# Patient Record
Sex: Female | Born: 1945 | Race: White | Hispanic: No | Smoking: Former smoker
Health system: Southern US, Community
[De-identification: ages and names within clinical notes are randomized; demographics above are authoritative.]

## PROBLEM LIST (undated history)

## (undated) DIAGNOSIS — N921 Excessive and frequent menstruation with irregular cycle: Secondary | ICD-10-CM

## (undated) DIAGNOSIS — I340 Nonrheumatic mitral (valve) insufficiency: Secondary | ICD-10-CM

## (undated) DIAGNOSIS — M1990 Unspecified osteoarthritis, unspecified site: Secondary | ICD-10-CM

## (undated) DIAGNOSIS — R569 Unspecified convulsions: Secondary | ICD-10-CM

## (undated) DIAGNOSIS — N393 Stress incontinence (female) (male): Secondary | ICD-10-CM

## (undated) DIAGNOSIS — I4891 Unspecified atrial fibrillation: Secondary | ICD-10-CM

## (undated) DIAGNOSIS — D219 Benign neoplasm of connective and other soft tissue, unspecified: Secondary | ICD-10-CM

## (undated) DIAGNOSIS — I350 Nonrheumatic aortic (valve) stenosis: Secondary | ICD-10-CM

## (undated) DIAGNOSIS — I429 Cardiomyopathy, unspecified: Secondary | ICD-10-CM

## (undated) DIAGNOSIS — R3129 Other microscopic hematuria: Secondary | ICD-10-CM

## (undated) DIAGNOSIS — N200 Calculus of kidney: Secondary | ICD-10-CM

## (undated) DIAGNOSIS — I480 Paroxysmal atrial fibrillation: Secondary | ICD-10-CM

## (undated) DIAGNOSIS — G40909 Epilepsy, unspecified, not intractable, without status epilepticus: Secondary | ICD-10-CM

## (undated) DIAGNOSIS — I517 Cardiomegaly: Secondary | ICD-10-CM

## (undated) DIAGNOSIS — E7800 Pure hypercholesterolemia, unspecified: Secondary | ICD-10-CM

## (undated) DIAGNOSIS — I10 Essential (primary) hypertension: Secondary | ICD-10-CM

## (undated) DIAGNOSIS — R0789 Other chest pain: Secondary | ICD-10-CM

## (undated) DIAGNOSIS — N907 Vulvar cyst: Secondary | ICD-10-CM

## (undated) DIAGNOSIS — I82409 Acute embolism and thrombosis of unspecified deep veins of unspecified lower extremity: Secondary | ICD-10-CM

## (undated) HISTORY — DX: Unspecified atrial fibrillation: I48.91

## (undated) HISTORY — DX: Essential (primary) hypertension: I10

## (undated) HISTORY — DX: Excessive and frequent menstruation with irregular cycle: N92.1

## (undated) HISTORY — DX: Nonrheumatic mitral (valve) insufficiency: I34.0

## (undated) HISTORY — PX: DILATION AND CURETTAGE OF UTERUS: SHX78

## (undated) HISTORY — DX: Calculus of kidney: N20.0

## (undated) HISTORY — DX: Other microscopic hematuria: R31.29

## (undated) HISTORY — DX: Unspecified convulsions: R56.9

## (undated) HISTORY — PX: OTHER SURGICAL HISTORY: SHX169

## (undated) HISTORY — DX: Pure hypercholesterolemia, unspecified: E78.00

## (undated) HISTORY — DX: Unspecified osteoarthritis, unspecified site: M19.90

## (undated) HISTORY — DX: Vulvar cyst: N90.7

## (undated) HISTORY — DX: Benign neoplasm of connective and other soft tissue, unspecified: D21.9

## (undated) HISTORY — DX: Cardiomegaly: I51.7

## (undated) HISTORY — DX: Acute embolism and thrombosis of unspecified deep veins of unspecified lower extremity: I82.409

## (undated) HISTORY — DX: Epilepsy, unspecified, not intractable, without status epilepticus: G40.909

## (undated) HISTORY — DX: Nonrheumatic aortic (valve) stenosis: I35.0

## (undated) HISTORY — DX: Stress incontinence (female) (male): N39.3

## (undated) HISTORY — PX: KNEE SURGERY: SHX244

---
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
## 1973-10-25 HISTORY — PX: TUBAL LIGATION: SHX77

## 1989-04-24 DIAGNOSIS — N907 Vulvar cyst: Secondary | ICD-10-CM

## 1989-04-24 HISTORY — DX: Vulvar cyst: N90.7

## 1990-06-25 DIAGNOSIS — N393 Stress incontinence (female) (male): Secondary | ICD-10-CM

## 1990-06-25 HISTORY — DX: Stress incontinence (female) (male): N39.3

## 1990-12-24 DIAGNOSIS — N921 Excessive and frequent menstruation with irregular cycle: Secondary | ICD-10-CM

## 1990-12-24 HISTORY — DX: Excessive and frequent menstruation with irregular cycle: N92.1

## 1994-10-25 DIAGNOSIS — R3129 Other microscopic hematuria: Secondary | ICD-10-CM

## 1994-10-25 HISTORY — DX: Other microscopic hematuria: R31.29

## 1998-10-20 ENCOUNTER — Other Ambulatory Visit: Admission: RE | Admit: 1998-10-20 | Discharge: 1998-10-20 | Payer: Self-pay | Admitting: *Deleted

## 1998-11-10 ENCOUNTER — Encounter: Payer: Self-pay | Admitting: *Deleted

## 1998-11-10 ENCOUNTER — Ambulatory Visit (HOSPITAL_COMMUNITY): Admission: RE | Admit: 1998-11-10 | Discharge: 1998-11-10 | Payer: Self-pay | Admitting: *Deleted

## 1999-11-02 ENCOUNTER — Other Ambulatory Visit: Admission: RE | Admit: 1999-11-02 | Discharge: 1999-11-02 | Payer: Self-pay | Admitting: *Deleted

## 2000-10-24 ENCOUNTER — Other Ambulatory Visit: Admission: RE | Admit: 2000-10-24 | Discharge: 2000-10-24 | Payer: Self-pay | Admitting: *Deleted

## 2000-11-07 ENCOUNTER — Ambulatory Visit (HOSPITAL_COMMUNITY): Admission: RE | Admit: 2000-11-07 | Discharge: 2000-11-07 | Payer: Self-pay | Admitting: *Deleted

## 2000-11-07 ENCOUNTER — Encounter: Payer: Self-pay | Admitting: *Deleted

## 2000-11-11 ENCOUNTER — Encounter: Admission: RE | Admit: 2000-11-11 | Discharge: 2000-11-11 | Payer: Self-pay | Admitting: *Deleted

## 2000-11-11 ENCOUNTER — Encounter: Payer: Self-pay | Admitting: *Deleted

## 2000-12-02 ENCOUNTER — Ambulatory Visit (HOSPITAL_COMMUNITY): Admission: RE | Admit: 2000-12-02 | Discharge: 2000-12-02 | Payer: Self-pay | Admitting: Obstetrics and Gynecology

## 2001-11-27 ENCOUNTER — Other Ambulatory Visit: Admission: RE | Admit: 2001-11-27 | Discharge: 2001-11-27 | Payer: Self-pay | Admitting: *Deleted

## 2002-09-10 ENCOUNTER — Encounter: Payer: Self-pay | Admitting: *Deleted

## 2002-09-10 ENCOUNTER — Encounter: Admission: RE | Admit: 2002-09-10 | Discharge: 2002-09-10 | Payer: Self-pay | Admitting: *Deleted

## 2002-12-05 ENCOUNTER — Other Ambulatory Visit: Admission: RE | Admit: 2002-12-05 | Discharge: 2002-12-05 | Payer: Self-pay | Admitting: *Deleted

## 2003-10-26 DIAGNOSIS — I517 Cardiomegaly: Secondary | ICD-10-CM

## 2003-10-26 HISTORY — DX: Cardiomegaly: I51.7

## 2003-12-30 ENCOUNTER — Other Ambulatory Visit: Admission: RE | Admit: 2003-12-30 | Discharge: 2003-12-30 | Payer: Self-pay | Admitting: *Deleted

## 2004-02-23 DIAGNOSIS — I82409 Acute embolism and thrombosis of unspecified deep veins of unspecified lower extremity: Secondary | ICD-10-CM

## 2004-02-23 HISTORY — DX: Acute embolism and thrombosis of unspecified deep veins of unspecified lower extremity: I82.409

## 2004-03-05 ENCOUNTER — Ambulatory Visit (HOSPITAL_COMMUNITY): Admission: RE | Admit: 2004-03-05 | Discharge: 2004-03-05 | Payer: Self-pay | Admitting: Family Medicine

## 2004-03-06 ENCOUNTER — Observation Stay (HOSPITAL_COMMUNITY): Admission: EM | Admit: 2004-03-06 | Discharge: 2004-03-06 | Payer: Self-pay | Admitting: Emergency Medicine

## 2004-04-06 ENCOUNTER — Encounter: Admission: RE | Admit: 2004-04-06 | Discharge: 2004-04-06 | Payer: Self-pay | Admitting: *Deleted

## 2005-01-04 ENCOUNTER — Other Ambulatory Visit: Admission: RE | Admit: 2005-01-04 | Discharge: 2005-01-04 | Payer: Self-pay | Admitting: *Deleted

## 2005-05-14 ENCOUNTER — Ambulatory Visit (HOSPITAL_COMMUNITY): Admission: RE | Admit: 2005-05-14 | Discharge: 2005-05-14 | Payer: Self-pay | Admitting: Family Medicine

## 2005-09-06 ENCOUNTER — Encounter: Admission: RE | Admit: 2005-09-06 | Discharge: 2005-09-06 | Payer: Self-pay | Admitting: *Deleted

## 2006-01-10 ENCOUNTER — Other Ambulatory Visit: Admission: RE | Admit: 2006-01-10 | Discharge: 2006-01-10 | Payer: Self-pay | Admitting: Obstetrics & Gynecology

## 2007-04-17 ENCOUNTER — Other Ambulatory Visit: Admission: RE | Admit: 2007-04-17 | Discharge: 2007-04-17 | Payer: Self-pay | Admitting: Obstetrics & Gynecology

## 2007-05-15 ENCOUNTER — Encounter: Admission: RE | Admit: 2007-05-15 | Discharge: 2007-05-15 | Payer: Self-pay | Admitting: Obstetrics & Gynecology

## 2008-04-22 ENCOUNTER — Other Ambulatory Visit: Admission: RE | Admit: 2008-04-22 | Discharge: 2008-04-22 | Payer: Self-pay | Admitting: Obstetrics & Gynecology

## 2008-06-27 ENCOUNTER — Ambulatory Visit: Payer: Self-pay | Admitting: Cardiology

## 2008-06-27 LAB — CONVERTED CEMR LAB: Pro B Natriuretic peptide (BNP): 29 pg/mL (ref 0.0–100.0)

## 2008-06-28 ENCOUNTER — Encounter: Admission: RE | Admit: 2008-06-28 | Discharge: 2008-06-28 | Payer: Self-pay | Admitting: Family Medicine

## 2008-07-10 ENCOUNTER — Ambulatory Visit: Payer: Self-pay

## 2008-07-10 ENCOUNTER — Encounter: Payer: Self-pay | Admitting: Cardiology

## 2008-08-21 ENCOUNTER — Ambulatory Visit: Payer: Self-pay | Admitting: Cardiology

## 2008-10-02 DIAGNOSIS — I82409 Acute embolism and thrombosis of unspecified deep veins of unspecified lower extremity: Secondary | ICD-10-CM | POA: Insufficient documentation

## 2008-10-02 DIAGNOSIS — I10 Essential (primary) hypertension: Secondary | ICD-10-CM

## 2008-10-02 DIAGNOSIS — E785 Hyperlipidemia, unspecified: Secondary | ICD-10-CM | POA: Insufficient documentation

## 2009-07-30 ENCOUNTER — Encounter: Admission: RE | Admit: 2009-07-30 | Discharge: 2009-07-30 | Payer: Self-pay | Admitting: Obstetrics & Gynecology

## 2009-08-08 ENCOUNTER — Encounter: Admission: RE | Admit: 2009-08-08 | Discharge: 2009-08-08 | Payer: Self-pay | Admitting: Obstetrics & Gynecology

## 2010-04-24 HISTORY — PX: BUNIONECTOMY: SHX129

## 2010-09-23 ENCOUNTER — Encounter: Admission: RE | Admit: 2010-09-23 | Discharge: 2010-09-23 | Payer: Self-pay | Admitting: Family Medicine

## 2010-11-14 ENCOUNTER — Encounter: Payer: Self-pay | Admitting: *Deleted

## 2011-03-09 NOTE — Assessment & Plan Note (Signed)
[facility]                            CARDIOLOGY OFFICE NOTE   NAME:Koepp, DOMENIQUE QUEST                       MRN:          [id number]  DATE:08/21/2008                            DOB:          Mar 20, 1946    PRIMARY CARE PHYSICIAN:  Juluis Rainier, MD   HISTORY OF PRESENT ILLNESS:  This is a 65 year old with a history of  hypertension, hypercholesterolemia, and obesity, presents to Cardiology  Clinic for evaluation of dyspnea on exertion and atypical chest pain.  The patient does state that she has gained about 25 pounds over the last  year or two years.  Associated with weight gain, she has noticed  significant short of breath with exertion.  She does state that if she  has to climb two flights of steps, she has to stop in between because of  significant shortness of breath after climbing the first flight.  She is  able to walk on flat ground fairly well without significant dyspnea.  Additionally, she does get some chest heaviness that happens  occasionally at rest and it can also happen sometimes with exertion with  the type of exertion that makes her short of breath as well.  This will  happen couple times a week and last few minutes at a time.  We did work  up with the patient with an echocardiogram, which showed an EF of 60%.  No regional wall motion abnormalities.  No significant valvular  abnormalities.  She had a BNP level of 29 which is normal and she had an  exercise Myoview on which she had poor exercise tolerance.  She only  went for 5 minutes and 3 seconds; however, she did reach greater than  85% of maximum predicted heart rate and did not have any evidence for  ischemia.  The patient is fairly sedentary.  She works for  [employer] and sits at [employer] all day long and really does not get any  exercise.  I did screen her for sleep apnea and though she does snore  some at night, she states that she never wakes up gasping and she does  not have any daytime sleepiness.   PAST MEDICAL HISTORY:  1. Hypertension.  2. Hypercholesterolemia.  3. Obesity.  4. Osteopenia.  5. Osteoarthritis.  6. Depression.  7. History of DVT in May 2005.  8. History of herpes zoster.  9. Echocardiogram in September 2009 showed EF of 60%.  No significant      valvular abnormalities.  We were unable to measure the pulmonary      artery pressure.  10.Exercise treadmill Myoview done in September 2009  5 minutes and 3      seconds, reached 70 METS, reached greater than 85% of maximum      predicted heart rate.  There was decreased anterior counts in a      fixed type pattern with normal wall motion.  This likely represents      breast attenuation.  There was no evidence for ischemia.   MEDICATIONS:  1. Zoloft 100 mg daily.  2. Aspirin  81 mg daily.  3. Actonel.  4. Folate.  5. Lovastatin 80 mg daily.  6. Telmisartan 80 mg daily.   SOCIAL HISTORY:  The patient works for [employer], she has a Event organiser.  She is married, lives with her husband.  She quit smoking tobacco  in 1969.  She has about one alcoholic drink a day.   PHYSICAL EXAMINATION:  VITALS:  Blood pressure 133/62, heart rate 63 and  regular, weight is 231 pounds up from 226 pounds at the last  appointment.  GENERAL:  This is an obese female in no apparent distress.  NEUROLOGICAL:  Alert and oriented x3, normal affect.  LUNGS:  Clear to auscultation bilaterally.  Normal respiratory effort.  NECK:  No JVD.  No thyromegaly or thyroid nodule.  CARDIOVASCULAR:  Heart regular S1, S2.  No S3, no S4.  There is a 1/6  systolic crescendo-decrescendo murmur early peaking at the right upper  sternal border.  EXTREMITIES:  There is trace ankle edema.  There are 1+ posterior tibial  pulses bilaterally.  There is no carotid bruit.  ABDOMEN:  Soft, obese, nontender.  No hepatosplenomegaly.  Normal bowel  sounds.   LABORATORY DATA:  Most recent, she did have a BNP of 29.  She had  a  normal TSH in her primary care physician's office and she had an LDL of  130 in her primary care physician's office.   ASSESSMENT AND PLAN:  This is a 65 year old with a history of  hypertension, hypercholesterolemia who presents with dyspnea on exertion  and atypical chest pain.  1. Dyspnea on exertion.  The patient's shortness of breath I think is      probably due to the significant weight gain she has had over the      past couple years and deconditioning.  We did an echocardiogram      that showed normal left ventricular function and normal valvular      function, and she did have a BNP of 29 suggesting no congestive      heart failure.  I did talk to her at length about diet and      exercise, and an effort for her to lose some weight as this may      help her exercise tolerance.  2. Atypical chest pain.  The patient does have atypical chest pain,      several risk factors for coronary artery disease including      hypertension, hypercholesterolemia, as well as her obesity.  She      did have an exercise treadmill Myoview.  This did show poor      exercise tolerance.  She only went for 5 minutes and 3 seconds, and      stopped due to shortness of breath; however, she did reach greater      than 85% of maximum predicted heart rate and there was no evidence      for ischemia.  We should continue aggressive risk factor      modification with blood pressure control, which is good currently      and lipid control.  I will keep her LDL less than 100 ideally.  She      also is to continue her aspirin 81 mg daily.  3. Hypertension.  Blood pressure is under good control.  4. Hypercholesterolemia.  We did increase her lovastatin to 80 mg      daily.  She will need a follow up lipids in  2 months with her      primary care physician.  The goal LDL would be under      100 for her.  5. We will be glad to see this patient back in the office on an as-      needed basis .     Marca Ancona, MD  Electronically Signed    DM/MedQ  DD: 08/21/2008  DT: 08/21/2008  Job #: [id number]   cc:   Juluis Rainier, M.D.

## 2011-03-09 NOTE — Assessment & Plan Note (Signed)
[facility]                            CARDIOLOGY OFFICE NOTE   NAME:Pacheco, Ashley WALSWORTH                       MRN:          [id number]  DATE:06/27/2008                            DOB:          05-07-1946    PRIMARY CARE PHYSICIAN:  Dossie Der, MD at [facility]  At [facility].   HISTORY OF PRESENT ILLNESS:  This is a 65 year old with a history of  hypertension, hypercholesterolemia, and obesity who presents to  Cardiology Clinic for evaluation of dyspnea on exertion and atypical  chest pain.  The patient states that she has gained about 25 pounds over  the last year to 2 years.  In association with this weight gain, she has  also started to notice significant shortness of breath with exertion.  She states that if she has to climb 2 flights of steps, she has to stop  in between because she becomes severely short of breath after climbing  the first flight.  She also becomes short of breath and has to stop  after walking 2 blocks on flat ground.  This pattern has had a gradual  onset over the last couple of years.  There has been no acute worsening  of this pattern.  She does not have orthopnea or PND.  She does not  exercise very much at baseline.  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  She additionally reports chest pressure and  tightness that is substernal and radiates to her posterior neck.  This  can last several minutes.  It happens several times a week.  It is not  associated with exertion, and it can really happen at any time.  She  cannot think of anything that particularly brings it on; it can happen  while she is walking around or it can happen while she is not doing  anything at all.  These episodes usually resolve on their own after a  couple of minutes.  The patient has been trying to diet to lose weight.  However, she has not been able to successfully lose weight.  She, as I  mentioned  earlier, is quite sedentary.   PAST MEDICAL HISTORY:  1. Hypertension.  2. Hypercholesterolemia.  3. Question of cardiomegaly on a chest x-ray.  4. Osteopenia.  5. Osteoarthritis.  6. Obesity.  7. Depression.  8. Deep venous thrombosis in May 2005.  9. History of herpes zoster.   MEDICATIONS:  1. Zoloft 100 mg a day.  2. Aspirin 81 mg a day.  3. Actonel.  4. Folate.  5. Lovastatin 40 mg a day.  6. Telmisartan 80 mg a day.   ELECTROCARDIOGRAM:  EKG today:  Normal sinus rhythm, borderline LVH,  poor R-wave progression, and anterior T-wave inversions, V1 through V4.   SOCIAL HISTORY:  The patient works for [employer].  She has a  Office manager.  She is married, lives with her husband.  She quit smoking  tobacco in 1969.  She has about one alcoholic drink a day.   FAMILY HISTORY:  The patient  states that there were strokes and heart  attacks in her mother's family but not her direct parents.  There is no  early premature coronary disease.  Father died at the age of 72 from  cancer, and mother died at the age of 18 from pneumonia.   REVIEW OF SYSTEMS:  Negative except as noted in history of present  illness.   PHYSICAL EXAMINATION:  VITAL SIGNS:  Blood pressure is 127/82, heart  rate is 64 and regular, weight is 226 pounds.  GENERAL:  No apparent distress.  This is an obese female.  NEUROLOGIC:  Alert and oriented x3.  There is normal affect.  LUNGS:  Clear to auscultation bilaterally.  Normal respiratory effort.  HEENT:  Normal.  NECK:  No JVD.  No thyromegaly or thyroid nodule.  CARDIOVASCULAR:  Heart regular, S1 and S2.  There is no S3 or S4.  There  is a 1/6 systolic crescendo-decrescendo murmur, early peaking, at the  right upper sternal border.  There is trace ankle edema.  There are 1+  posterior tibial pulses bilaterally.  There is no carotid bruit.  ABDOMEN:  Obese, soft, nontender.  No hepatosplenomegaly.  Normal bowel  sounds.  MUSCULOSKELETAL:  Normal.   SKIN:  Normal.   ASSESSMENT AND PLAN:  This is a 65 year old with history of hypertension  and hypercholesterolemia who presents with dyspnea on exertion and  atypical chest pain.  1. Dyspnea on exertion.  The patient's shortness of breath may very      well be due to the significant weight gain she has had over the      past couple of years and deconditioning.  However, she does state      that her symptoms are quite bothersome.  She has to stop her after      climbing a flight of stairs and stop after walking for 2 blocks.      This is a fairly chronic pattern.  We will go ahead and obtain a      BNP and an echocardiogram to assess for any evidence of left      ventricular dysfunction.  She does have a mild murmur on exam that      is likely due to aortic sclerosis, so I do not suspect any      significant valvular disease will be found.  We will also check her      pulmonary pressures as well.  2. Atypical chest pain.  The patient does have atypical chest pressure      and tightness.  She does have several risk factors for coronary      artery disease including hypertension and hypercholesterolemia as      well as her obesity.  Especially, I would like her to increase her      exercise level.  I think it would be reasonable to go ahead and      obtain a stress test to rule out significant ischemia.  We will      have her do an exercise treadmill Myoview.  If this does turn out      to be negative, I do want to encourage her to significantly      increase her exercise level to help her with weight loss.  3. Hypertension.  Blood pressure is under good control.  4. Hypercholesterolemia.  The patient's LDL is 130.  I would ideally      like to see her under 100, so I will increase  her lovastatin to 80      mg daily.  5. The patient does have the body habitus for obstructive sleep apnea.      After this initial testing is done, at the next appointment we will      address obstructive  sleep apnea screening.     Marca Ancona, MD  Electronically Signed    DM/MedQ  DD: 06/27/2008  DT: 06/28/2008  Job #: [id number]   cc:   Pricilla Riffle, MD, [facility]  Dossie Der, MD

## 2011-07-26 ENCOUNTER — Ambulatory Visit: Payer: Medicare Other | Attending: Family Medicine | Admitting: Physical Therapy

## 2011-07-26 DIAGNOSIS — IMO0001 Reserved for inherently not codable concepts without codable children: Secondary | ICD-10-CM | POA: Insufficient documentation

## 2011-07-26 DIAGNOSIS — M25569 Pain in unspecified knee: Secondary | ICD-10-CM | POA: Insufficient documentation

## 2011-07-26 DIAGNOSIS — M6281 Muscle weakness (generalized): Secondary | ICD-10-CM | POA: Insufficient documentation

## 2011-07-26 DIAGNOSIS — R269 Unspecified abnormalities of gait and mobility: Secondary | ICD-10-CM | POA: Insufficient documentation

## 2011-07-26 DIAGNOSIS — M25669 Stiffness of unspecified knee, not elsewhere classified: Secondary | ICD-10-CM | POA: Insufficient documentation

## 2011-08-02 ENCOUNTER — Ambulatory Visit: Payer: Medicare Other

## 2011-08-06 ENCOUNTER — Ambulatory Visit: Payer: Medicare Other | Admitting: Physical Therapy

## 2011-08-09 ENCOUNTER — Ambulatory Visit: Payer: Medicare Other | Admitting: Physical Therapy

## 2011-08-11 ENCOUNTER — Ambulatory Visit: Payer: Medicare Other | Admitting: Physical Therapy

## 2011-08-16 ENCOUNTER — Ambulatory Visit: Payer: Medicare Other | Admitting: Physical Therapy

## 2011-08-18 ENCOUNTER — Ambulatory Visit: Payer: Medicare Other | Admitting: Physical Therapy

## 2011-08-23 ENCOUNTER — Ambulatory Visit: Payer: Medicare Other | Admitting: Physical Therapy

## 2011-08-25 ENCOUNTER — Ambulatory Visit: Payer: Medicare Other | Admitting: Physical Therapy

## 2011-11-23 DIAGNOSIS — R635 Abnormal weight gain: Secondary | ICD-10-CM | POA: Diagnosis not present

## 2011-11-23 DIAGNOSIS — Z86010 Personal history of colonic polyps: Secondary | ICD-10-CM | POA: Diagnosis not present

## 2011-11-23 DIAGNOSIS — K625 Hemorrhage of anus and rectum: Secondary | ICD-10-CM | POA: Diagnosis not present

## 2011-11-23 DIAGNOSIS — K5730 Diverticulosis of large intestine without perforation or abscess without bleeding: Secondary | ICD-10-CM | POA: Diagnosis not present

## 2011-12-27 DIAGNOSIS — Z923 Personal history of irradiation: Secondary | ICD-10-CM | POA: Diagnosis not present

## 2011-12-27 DIAGNOSIS — E559 Vitamin D deficiency, unspecified: Secondary | ICD-10-CM | POA: Diagnosis not present

## 2011-12-27 DIAGNOSIS — M899 Disorder of bone, unspecified: Secondary | ICD-10-CM | POA: Diagnosis not present

## 2011-12-27 DIAGNOSIS — F329 Major depressive disorder, single episode, unspecified: Secondary | ICD-10-CM | POA: Diagnosis not present

## 2011-12-27 DIAGNOSIS — I10 Essential (primary) hypertension: Secondary | ICD-10-CM | POA: Diagnosis not present

## 2011-12-27 DIAGNOSIS — E7800 Pure hypercholesterolemia, unspecified: Secondary | ICD-10-CM | POA: Diagnosis not present

## 2011-12-27 DIAGNOSIS — G479 Sleep disorder, unspecified: Secondary | ICD-10-CM | POA: Diagnosis not present

## 2011-12-27 DIAGNOSIS — Z79899 Other long term (current) drug therapy: Secondary | ICD-10-CM | POA: Diagnosis not present

## 2012-01-17 DIAGNOSIS — K5000 Crohn's disease of small intestine without complications: Secondary | ICD-10-CM | POA: Diagnosis not present

## 2012-01-17 DIAGNOSIS — D131 Benign neoplasm of stomach: Secondary | ICD-10-CM | POA: Diagnosis not present

## 2012-01-17 DIAGNOSIS — D129 Benign neoplasm of anus and anal canal: Secondary | ICD-10-CM | POA: Diagnosis not present

## 2012-01-17 DIAGNOSIS — Z800 Family history of malignant neoplasm of digestive organs: Secondary | ICD-10-CM | POA: Diagnosis not present

## 2012-01-17 DIAGNOSIS — K620 Anal polyp: Secondary | ICD-10-CM | POA: Diagnosis not present

## 2012-01-17 DIAGNOSIS — D128 Benign neoplasm of rectum: Secondary | ICD-10-CM | POA: Diagnosis not present

## 2012-01-17 DIAGNOSIS — D126 Benign neoplasm of colon, unspecified: Secondary | ICD-10-CM | POA: Diagnosis not present

## 2012-01-17 DIAGNOSIS — K319 Disease of stomach and duodenum, unspecified: Secondary | ICD-10-CM | POA: Diagnosis not present

## 2012-01-17 DIAGNOSIS — K625 Hemorrhage of anus and rectum: Secondary | ICD-10-CM | POA: Diagnosis not present

## 2012-03-31 DIAGNOSIS — R05 Cough: Secondary | ICD-10-CM | POA: Diagnosis not present

## 2012-03-31 DIAGNOSIS — I10 Essential (primary) hypertension: Secondary | ICD-10-CM | POA: Diagnosis not present

## 2012-03-31 DIAGNOSIS — J329 Chronic sinusitis, unspecified: Secondary | ICD-10-CM | POA: Diagnosis not present

## 2012-06-11 DIAGNOSIS — R071 Chest pain on breathing: Secondary | ICD-10-CM | POA: Diagnosis not present

## 2012-06-29 DIAGNOSIS — M25569 Pain in unspecified knee: Secondary | ICD-10-CM | POA: Diagnosis not present

## 2012-06-29 DIAGNOSIS — Z23 Encounter for immunization: Secondary | ICD-10-CM | POA: Diagnosis not present

## 2012-06-29 DIAGNOSIS — R609 Edema, unspecified: Secondary | ICD-10-CM | POA: Diagnosis not present

## 2012-07-04 DIAGNOSIS — M25569 Pain in unspecified knee: Secondary | ICD-10-CM | POA: Diagnosis not present

## 2012-07-20 DIAGNOSIS — IMO0002 Reserved for concepts with insufficient information to code with codable children: Secondary | ICD-10-CM | POA: Diagnosis not present

## 2012-07-28 DIAGNOSIS — M1710 Unilateral primary osteoarthritis, unspecified knee: Secondary | ICD-10-CM | POA: Diagnosis not present

## 2012-08-01 DIAGNOSIS — M9420 Chondromalacia, unspecified site: Secondary | ICD-10-CM | POA: Diagnosis not present

## 2012-08-01 DIAGNOSIS — IMO0002 Reserved for concepts with insufficient information to code with codable children: Secondary | ICD-10-CM | POA: Diagnosis not present

## 2012-08-03 DIAGNOSIS — Z124 Encounter for screening for malignant neoplasm of cervix: Secondary | ICD-10-CM | POA: Diagnosis not present

## 2012-08-03 DIAGNOSIS — Z01419 Encounter for gynecological examination (general) (routine) without abnormal findings: Secondary | ICD-10-CM | POA: Diagnosis not present

## 2012-08-04 DIAGNOSIS — IMO0002 Reserved for concepts with insufficient information to code with codable children: Secondary | ICD-10-CM | POA: Diagnosis not present

## 2012-08-04 DIAGNOSIS — M9420 Chondromalacia, unspecified site: Secondary | ICD-10-CM | POA: Diagnosis not present

## 2012-08-04 DIAGNOSIS — M2240 Chondromalacia patellae, unspecified knee: Secondary | ICD-10-CM | POA: Diagnosis not present

## 2012-08-04 DIAGNOSIS — Y999 Unspecified external cause status: Secondary | ICD-10-CM | POA: Diagnosis not present

## 2012-08-04 DIAGNOSIS — Y939 Activity, unspecified: Secondary | ICD-10-CM | POA: Diagnosis not present

## 2012-08-04 DIAGNOSIS — Y929 Unspecified place or not applicable: Secondary | ICD-10-CM | POA: Diagnosis not present

## 2012-08-04 DIAGNOSIS — S83289A Other tear of lateral meniscus, current injury, unspecified knee, initial encounter: Secondary | ICD-10-CM | POA: Diagnosis not present

## 2012-08-04 DIAGNOSIS — X58XXXA Exposure to other specified factors, initial encounter: Secondary | ICD-10-CM | POA: Diagnosis not present

## 2012-09-04 DIAGNOSIS — M949 Disorder of cartilage, unspecified: Secondary | ICD-10-CM | POA: Diagnosis not present

## 2012-09-04 DIAGNOSIS — J329 Chronic sinusitis, unspecified: Secondary | ICD-10-CM | POA: Diagnosis not present

## 2012-09-04 DIAGNOSIS — M899 Disorder of bone, unspecified: Secondary | ICD-10-CM | POA: Diagnosis not present

## 2012-09-04 DIAGNOSIS — F329 Major depressive disorder, single episode, unspecified: Secondary | ICD-10-CM | POA: Diagnosis not present

## 2012-09-20 DIAGNOSIS — L723 Sebaceous cyst: Secondary | ICD-10-CM | POA: Diagnosis not present

## 2012-10-06 DIAGNOSIS — IMO0002 Reserved for concepts with insufficient information to code with codable children: Secondary | ICD-10-CM | POA: Diagnosis not present

## 2012-10-06 DIAGNOSIS — M9420 Chondromalacia, unspecified site: Secondary | ICD-10-CM | POA: Diagnosis not present

## 2012-10-12 DIAGNOSIS — IMO0002 Reserved for concepts with insufficient information to code with codable children: Secondary | ICD-10-CM | POA: Diagnosis not present

## 2012-10-13 DIAGNOSIS — J069 Acute upper respiratory infection, unspecified: Secondary | ICD-10-CM | POA: Diagnosis not present

## 2012-10-13 DIAGNOSIS — M9420 Chondromalacia, unspecified site: Secondary | ICD-10-CM | POA: Diagnosis not present

## 2012-10-16 DIAGNOSIS — IMO0002 Reserved for concepts with insufficient information to code with codable children: Secondary | ICD-10-CM | POA: Diagnosis not present

## 2012-10-16 DIAGNOSIS — M9420 Chondromalacia, unspecified site: Secondary | ICD-10-CM | POA: Diagnosis not present

## 2012-10-20 DIAGNOSIS — IMO0002 Reserved for concepts with insufficient information to code with codable children: Secondary | ICD-10-CM | POA: Diagnosis not present

## 2012-10-20 DIAGNOSIS — M9420 Chondromalacia, unspecified site: Secondary | ICD-10-CM | POA: Diagnosis not present

## 2012-10-27 DIAGNOSIS — M9420 Chondromalacia, unspecified site: Secondary | ICD-10-CM | POA: Diagnosis not present

## 2012-10-27 DIAGNOSIS — IMO0002 Reserved for concepts with insufficient information to code with codable children: Secondary | ICD-10-CM | POA: Diagnosis not present

## 2012-10-31 DIAGNOSIS — M9420 Chondromalacia, unspecified site: Secondary | ICD-10-CM | POA: Diagnosis not present

## 2012-10-31 DIAGNOSIS — IMO0002 Reserved for concepts with insufficient information to code with codable children: Secondary | ICD-10-CM | POA: Diagnosis not present

## 2012-11-03 DIAGNOSIS — IMO0002 Reserved for concepts with insufficient information to code with codable children: Secondary | ICD-10-CM | POA: Diagnosis not present

## 2012-11-03 DIAGNOSIS — M25569 Pain in unspecified knee: Secondary | ICD-10-CM | POA: Diagnosis not present

## 2012-11-07 DIAGNOSIS — M25569 Pain in unspecified knee: Secondary | ICD-10-CM | POA: Diagnosis not present

## 2012-11-07 DIAGNOSIS — IMO0002 Reserved for concepts with insufficient information to code with codable children: Secondary | ICD-10-CM | POA: Diagnosis not present

## 2012-11-10 DIAGNOSIS — IMO0002 Reserved for concepts with insufficient information to code with codable children: Secondary | ICD-10-CM | POA: Diagnosis not present

## 2012-11-10 DIAGNOSIS — M25569 Pain in unspecified knee: Secondary | ICD-10-CM | POA: Diagnosis not present

## 2012-11-14 DIAGNOSIS — IMO0002 Reserved for concepts with insufficient information to code with codable children: Secondary | ICD-10-CM | POA: Diagnosis not present

## 2012-11-14 DIAGNOSIS — M25569 Pain in unspecified knee: Secondary | ICD-10-CM | POA: Diagnosis not present

## 2012-11-17 DIAGNOSIS — M25569 Pain in unspecified knee: Secondary | ICD-10-CM | POA: Diagnosis not present

## 2012-11-17 DIAGNOSIS — IMO0002 Reserved for concepts with insufficient information to code with codable children: Secondary | ICD-10-CM | POA: Diagnosis not present

## 2012-11-21 DIAGNOSIS — M25569 Pain in unspecified knee: Secondary | ICD-10-CM | POA: Diagnosis not present

## 2012-11-24 DIAGNOSIS — M25569 Pain in unspecified knee: Secondary | ICD-10-CM | POA: Diagnosis not present

## 2012-12-06 DIAGNOSIS — IMO0002 Reserved for concepts with insufficient information to code with codable children: Secondary | ICD-10-CM | POA: Diagnosis not present

## 2012-12-06 DIAGNOSIS — M25569 Pain in unspecified knee: Secondary | ICD-10-CM | POA: Diagnosis not present

## 2012-12-22 DIAGNOSIS — IMO0002 Reserved for concepts with insufficient information to code with codable children: Secondary | ICD-10-CM | POA: Diagnosis not present

## 2013-01-02 DIAGNOSIS — M9420 Chondromalacia, unspecified site: Secondary | ICD-10-CM | POA: Diagnosis not present

## 2013-01-05 DIAGNOSIS — M25569 Pain in unspecified knee: Secondary | ICD-10-CM | POA: Diagnosis not present

## 2013-03-15 DIAGNOSIS — E559 Vitamin D deficiency, unspecified: Secondary | ICD-10-CM | POA: Diagnosis not present

## 2013-03-15 DIAGNOSIS — Z1331 Encounter for screening for depression: Secondary | ICD-10-CM | POA: Diagnosis not present

## 2013-03-15 DIAGNOSIS — E7800 Pure hypercholesterolemia, unspecified: Secondary | ICD-10-CM | POA: Diagnosis not present

## 2013-03-15 DIAGNOSIS — Z79899 Other long term (current) drug therapy: Secondary | ICD-10-CM | POA: Diagnosis not present

## 2013-03-15 DIAGNOSIS — M899 Disorder of bone, unspecified: Secondary | ICD-10-CM | POA: Diagnosis not present

## 2013-03-15 DIAGNOSIS — R002 Palpitations: Secondary | ICD-10-CM | POA: Diagnosis not present

## 2013-03-15 DIAGNOSIS — I10 Essential (primary) hypertension: Secondary | ICD-10-CM | POA: Diagnosis not present

## 2013-03-15 DIAGNOSIS — Z0000 Encounter for general adult medical examination without abnormal findings: Secondary | ICD-10-CM | POA: Diagnosis not present

## 2013-07-12 ENCOUNTER — Encounter: Payer: Self-pay | Admitting: Obstetrics & Gynecology

## 2013-08-24 DIAGNOSIS — H04129 Dry eye syndrome of unspecified lacrimal gland: Secondary | ICD-10-CM | POA: Diagnosis not present

## 2013-08-24 DIAGNOSIS — Z961 Presence of intraocular lens: Secondary | ICD-10-CM | POA: Diagnosis not present

## 2013-10-10 ENCOUNTER — Ambulatory Visit: Payer: Self-pay | Admitting: Obstetrics & Gynecology

## 2013-10-12 ENCOUNTER — Encounter: Payer: Self-pay | Admitting: Obstetrics & Gynecology

## 2013-10-12 ENCOUNTER — Ambulatory Visit (INDEPENDENT_AMBULATORY_CARE_PROVIDER_SITE_OTHER): Payer: Medicare Other | Admitting: Obstetrics & Gynecology

## 2013-10-12 VITALS — BP 122/78 | HR 64 | Resp 16 | Ht 62.5 in | Wt 230.0 lb

## 2013-10-12 DIAGNOSIS — Z01419 Encounter for gynecological examination (general) (routine) without abnormal findings: Secondary | ICD-10-CM | POA: Diagnosis not present

## 2013-10-12 DIAGNOSIS — Z124 Encounter for screening for malignant neoplasm of cervix: Secondary | ICD-10-CM

## 2013-10-12 NOTE — Patient Instructions (Signed)
Things for you to do:  Schedule Mammogram  Check about tetanus shot.  Last documentation 3/05.

## 2013-10-12 NOTE — Progress Notes (Signed)
67 y.o. G2P2 MarriedCaucasianF here for annual exam.  Had knee surgery last year--arthroscopy.  Did help with the pain.  Did do physical therapy last year as well.  Does have a little limping.  Has seen Dr. Zachery Dauer this year for blood word.   Patient's last menstrual period was 10/26/1999.          Sexually active: no  The current method of family planning is tubal ligation.    Exercising: yes  walking, but not regularly Smoker:  no  Health Maintenance: Pap:  08/03/12 WNL History of abnormal Pap:  no MM[facility]:  [facility] Colonoscopy:  2013 repeat in 5 years, Dr. Loreta Ave BMD:   11/11 with her MMG TDaP:  PCP up to date Screening Labs: PCP, Hb today: PCP, Urine today: PCP   reports that she has never smoked. She has never used smokeless tobacco. She reports that she drinks alcohol. She reports that she does not use illicit drugs.  Past Medical History  Diagnosis Date  . SUI (stress urinary incontinence, female) 9/91  . Vulvar cyst 7/90  . Menometrorrhagia 3/92    menopausal  . Microhematuria 1/96  . Fibroid   . Hypertension   . Cardiomegaly 2005  . DVT (deep venous thrombosis) 02/2004  . 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 Tonsillectomy and adenoidectomy    . Appendectomy    . Tubal ligation  1975    BTL  . Dilation and curettage of uterus      hystreroscopy/imcomplete polypectomy  . Cataracts  3/09 4/09  . Bunionectomy  7/11    second toe  . Knee surgery  10/13 or 11/13    right knee    Current Outpatient Prescriptions  Medication Sig Dispense Refill  . Acetaminophen (TYLENOL 8 HOUR PO) Take by mouth as needed.      . Ascorbic Acid (VITAMIN C PO) Take by mouth daily.      [name] aspirin 81 MG tablet Take 81 mg by mouth daily.      . Calcium Carbonate (CALCIUM 600 PO) Take by mouth daily.      . Cholecalciferol (VITAMIN D) 2000 UNITS tablet Take 2,000 Units by mouth daily.      . DiphenhydrAMINE HCl  (BENADRYL ALLERGY PO) Take by mouth as needed.      . Ibuprofen (ADVIL PO) Take by mouth as needed.      . lovastatin (MEVACOR) 40 MG tablet       . MICARDIS 80 MG tablet       . Multiple Vitamin (MULTI-VITAMIN DAILY PO) Take by mouth.      . sertraline (ZOLOFT) 100 MG tablet Take 100 mg by mouth daily.      . traZODone (DESYREL) 150 MG tablet       . glucosamine-chondroitin 500-400 MG tablet Take 1 tablet by mouth 3 (three) times daily.       No current facility-administered medications for this visit.    Family History  Problem Relation Age of Onset  . Skin cancer Other     both siblings  . Cancer Father     stomach and prostate  . Hypertension Mother   . Asthma Mother   . Kidney Stones Mother   . Kidney Stones Father     ROS:  Pertinent items are noted in HPI.  Otherwise, a comprehensive ROS was negative.  Exam:  BP 122/78  Pulse 64  Resp 16  Ht 5' 2.5" (1.588 m)  Wt 230 lb (104.327 kg)  BMI 41.37 kg/m2  LMP 10/26/1999  Weight change: stable  Height: 5' 2.5" (158.8 cm) (with shoes)  Ht Readings from Last 3 Encounters:  10/12/13 5' 2.5" (1.588 m)    General appearance: alert, cooperative and appears stated age Head: Normocephalic, without obvious abnormality, atraumatic Neck: no adenopathy, supple, symmetrical, trachea midline and thyroid normal to inspection and palpation Lungs: clear to auscultation bilaterally Breasts: normal appearance, no masses or tenderness Heart: regular rate and rhythm Abdomen: soft, non-tender; bowel sounds normal; no masses,  no organomegaly Extremities: extremities normal, atraumatic, no cyanosis or edema Skin: Skin color, texture, turgor normal. No rashes or lesions Lymph nodes: Cervical, supraclavicular, and axillary nodes normal. No abnormal inguinal nodes palpated Neurologic: Grossly normal   Pelvic: External genitalia:  no lesions              Urethra:  normal appearing urethra with no masses, tenderness or lesions               Bartholins and Skenes: normal                 Vagina: normal appearing vagina with normal color and discharge, no lesions              Cervix: no lesions              Pap taken: no Bimanual Exam:  Uterus:  normal size, contour, position, consistency, mobility, non-tender              Adnexa: normal adnexa and no mass, fullness, tenderness               Rectovaginal: Confirms               Anus:  normal sphincter tone, no lesions  A:  Well Woman with normal exam Elevated lipids Hypertension Obesity Osteopenia Low Vit D  P:   Mammogram due.  Pt WILL schedule this.  Declines [place] scheudling today. BMD not due yet Labs with Dr. Zachery Dauer every six months.   pap smear 10/13, neg.  No pap today. return annually or prn  An After Visit Summary was printed and given to the patient.

## 2013-10-23 DIAGNOSIS — L219 Seborrheic dermatitis, unspecified: Secondary | ICD-10-CM | POA: Diagnosis not present

## 2013-10-23 DIAGNOSIS — L570 Actinic keratosis: Secondary | ICD-10-CM | POA: Diagnosis not present

## 2013-10-25 DIAGNOSIS — N200 Calculus of kidney: Secondary | ICD-10-CM

## 2013-10-25 HISTORY — DX: Calculus of kidney: N20.0

## 2013-11-10 ENCOUNTER — Telehealth: Payer: Self-pay | Admitting: Obstetrics & Gynecology

## 2013-11-29 DIAGNOSIS — G479 Sleep disorder, unspecified: Secondary | ICD-10-CM | POA: Diagnosis not present

## 2013-11-29 DIAGNOSIS — I10 Essential (primary) hypertension: Secondary | ICD-10-CM | POA: Diagnosis not present

## 2013-11-29 DIAGNOSIS — F3289 Other specified depressive episodes: Secondary | ICD-10-CM | POA: Diagnosis not present

## 2013-11-29 DIAGNOSIS — E7800 Pure hypercholesterolemia, unspecified: Secondary | ICD-10-CM | POA: Diagnosis not present

## 2013-11-29 DIAGNOSIS — Z79899 Other long term (current) drug therapy: Secondary | ICD-10-CM | POA: Diagnosis not present

## 2013-11-29 DIAGNOSIS — F329 Major depressive disorder, single episode, unspecified: Secondary | ICD-10-CM | POA: Diagnosis not present

## 2013-11-29 DIAGNOSIS — E559 Vitamin D deficiency, unspecified: Secondary | ICD-10-CM | POA: Diagnosis not present

## 2013-11-29 DIAGNOSIS — R079 Chest pain, unspecified: Secondary | ICD-10-CM | POA: Diagnosis not present

## 2013-12-21 ENCOUNTER — Encounter: Payer: Self-pay | Admitting: Cardiology

## 2013-12-21 ENCOUNTER — Ambulatory Visit (INDEPENDENT_AMBULATORY_CARE_PROVIDER_SITE_OTHER): Payer: BC Managed Care – PPO | Admitting: Cardiology

## 2013-12-21 VITALS — BP 150/100 | HR 71 | Ht 62.5 in | Wt 228.0 lb

## 2013-12-21 DIAGNOSIS — R011 Cardiac murmur, unspecified: Secondary | ICD-10-CM

## 2013-12-21 DIAGNOSIS — R0609 Other forms of dyspnea: Secondary | ICD-10-CM | POA: Diagnosis not present

## 2013-12-21 DIAGNOSIS — R0989 Other specified symptoms and signs involving the circulatory and respiratory systems: Secondary | ICD-10-CM | POA: Diagnosis not present

## 2013-12-21 DIAGNOSIS — R079 Chest pain, unspecified: Secondary | ICD-10-CM | POA: Diagnosis not present

## 2013-12-21 DIAGNOSIS — R0600 Dyspnea, unspecified: Secondary | ICD-10-CM

## 2013-12-21 NOTE — Patient Instructions (Signed)
Your physician has requested that you have a lexiscan myoview. For further information please visit [email or web address]. Please follow instruction sheet, as given.  Your physician has requested that you have an echocardiogram. Echocardiography is a painless test that uses sound waves to create images of your heart. It provides your doctor with information about the size and shape of your heart and how well your heart's chambers and valves are working. This procedure takes approximately one hour. There are no restrictions for this procedure.  Your physician recommends that you continue on your current medications as directed. Please refer to the Current Medication list given to you today.

## 2013-12-21 NOTE — Progress Notes (Signed)
[facility]. [address], [address] [place], [place]  [address] Phone: [phone] Fax:  [phone]  Date:  12/21/2013   ID:  Ashley Pacheco, DOB [birth year], MRN [id number]  PCP:  Gerrit Heck, MD   History of Present Illness: Ashley Pacheco is a 68 y.o. female here for the evaluation of atypical chest pain and dyspnea on exertion with a history of hypertension and hyperlipidemia. Cant predict when it happens. Has pains along left arm, more of an ache. Chest feels tight. Has palpitations when laying down. She has had dull left-sided chest pain with rest or lying down it lasts for approximately 30 minutes. Does not seem to happen with exertion. Last May, she had an abnormal EKG but declined cardiology referral. She seems to get winded with exertion but thinks that she is just deconditioned. She has been having this since age 17.  Her blood pressure has been under good control.   Wt Readings from Last 3 Encounters:  12/21/13 228 lb (103.42 kg)  10/12/13 230 lb (104.327 kg)     Past Medical History  Diagnosis Date  . SUI (stress urinary incontinence, female) 9/91  . Vulvar cyst 7/90  . Menometrorrhagia 3/92    menopausal  . Microhematuria 1/96  . Fibroid   . Hypertension   . Cardiomegaly 2005  . DVT (deep venous thrombosis) 02/2004  . 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 Tonsillectomy and adenoidectomy    . Appendectomy    . Tubal ligation  1975    BTL  . Dilation and curettage of uterus      hystreroscopy/imcomplete polypectomy  . Cataracts  3/09 4/09  . Bunionectomy  7/11    second toe  . Knee surgery  10/13 or 11/13    right knee arthroscopy    Current Outpatient Prescriptions  Medication Sig Dispense Refill  . Acetaminophen (TYLENOL 8 HOUR PO) Take by mouth as needed.      . Ascorbic Acid (VITAMIN C PO) Take by mouth as needed.       [name] aspirin 81 MG tablet Take 81 mg by mouth daily.      . Calcium Carbonate  (CALCIUM 600 PO) Take 600 mg by mouth daily.       . Cholecalciferol (VITAMIN D) 2000 UNITS tablet Take 2,000 Units by mouth 2 (two) times daily.       . DiphenhydrAMINE HCl (BENADRYL ALLERGY PO) Take by mouth as needed.      . fluticasone (CUTIVATE) 0.05 % cream Apply 1 application topically daily.      [name] glucosamine-chondroitin 500-400 MG tablet Take 1 tablet by mouth as needed.       . Ibuprofen (ADVIL PO) Take by mouth as needed.      . lovastatin (MEVACOR) 40 MG tablet Take 40 mg by mouth at bedtime.       . Methylsulfonylmethane (MSM PO) Take by mouth as needed.      [name] MICARDIS 80 MG tablet Take 80 mg by mouth daily.       . Multiple Vitamin (MULTI-VITAMIN DAILY PO) Take 1 tablet by mouth daily.       . NON FORMULARY Salicyclic Ac Cetaphill Cream 8% Daily      . sertraline (ZOLOFT) 100 MG tablet Take 100 mg by mouth daily.      . traZODone (DESYREL) 150 MG tablet 150 mg at bedtime.  No current facility-administered medications for this visit.    Allergies:   No Known Allergies  Social History:  The patient  reports that she has never smoked. She has never used smokeless tobacco. She reports that she drinks alcohol. She reports that she does not use illicit drugs.   Family History  Problem Relation Age of Onset  . Skin cancer Other     both siblings  . Cancer Father     stomach and prostate  . Hypertension Mother   . Asthma Mother   . Kidney Stones Mother   . Kidney Stones Father   No early CAD.   ROS:  Please see the history of present illness.   Denies any fevers, chills, orthopnea, PND, syncope.   All other systems reviewed and negative.   PHYSICAL EXAM: VS:  BP 150/100  Pulse 71  Ht 5' 2.5" (1.588 m)  Wt 228 lb (103.42 kg)  BMI 41.01 kg/m2  LMP 10/26/1999 Well nourished, well developed, in no acute distress HEENT: normal, [place]/AT, EOMI Neck: no JVD, normal carotid upstroke, no bruit Cardiac:  normal S1, S2; RRR;  murmur Lungs:  clear to auscultation  bilaterally, no wheezing, rhonchi or rales Abd: soft, nontender, no hepatomegaly, no bruitsobese Ext: no edema, 2+ distal pulses Skin: warm and dry GU: deferred Neuro: no focal abnormalities noted, AAO x 3  EKG:  11/29/13: Sinus bradycardia rate 56 with nonspecific ST-T wave changes. 12/21/13: Normal sinus rhythm, nonspecific ST changes, no change from prior Echocardiogram 07/10/08-normal EF, trivial MR. Nuclear stress 9/09-low risk, no ischemia    Labs: Creatinine 0.64, LDL 76  ASSESSMENT AND PLAN:  1. Chest pain-atypical chest tightness. We will go ahead and proceed with pharmacologic stress test. She states that she is very clumsy on treadmill and has fallen in the past. We will likely be able to do a pharmacologic with mild exercise. 2. Dyspnea-long-standing. I will check an echocardiogram. I do appreciate a systolic murmur at right upper sternal border. Dyspnea could be multifactorial including deconditioning, obesity. 3. Morbid obesity-encourage weight loss, decrease carbohydrates. 4. Hypertension-blood pressure is elevated this morning however she did not take her medications. Normally it is under good control. 5. Hyperlipidemia-she has been on lovastatin for quite some time. Continue. Tolerating well.  Signed, Candee Furbish, MD [facility]  12/21/2013 10:57 AM

## 2013-12-25 ENCOUNTER — Ambulatory Visit (HOSPITAL_COMMUNITY): Payer: Medicare Other | Attending: Internal Medicine | Admitting: Radiology

## 2013-12-25 ENCOUNTER — Encounter: Payer: Self-pay | Admitting: Cardiology

## 2013-12-25 DIAGNOSIS — R011 Cardiac murmur, unspecified: Secondary | ICD-10-CM

## 2013-12-25 NOTE — Progress Notes (Signed)
Echocardiogram performed.  

## 2013-12-27 ENCOUNTER — Encounter: Payer: Self-pay | Admitting: Cardiology

## 2014-01-02 ENCOUNTER — Ambulatory Visit (HOSPITAL_COMMUNITY): Payer: Medicare Other | Attending: Cardiovascular Disease | Admitting: Radiology

## 2014-01-02 ENCOUNTER — Encounter: Payer: Self-pay | Admitting: Cardiovascular Disease

## 2014-01-02 VITALS — BP 154/88 | HR 61 | Ht 62.5 in | Wt 229.0 lb

## 2014-01-02 DIAGNOSIS — R0602 Shortness of breath: Secondary | ICD-10-CM

## 2014-01-02 DIAGNOSIS — R079 Chest pain, unspecified: Secondary | ICD-10-CM | POA: Diagnosis not present

## 2014-01-02 MED ORDER — REGADENOSON 0.4 MG/5ML IV SOLN
0.4000 mg | Freq: Once | INTRAVENOUS | Status: AC
  Administered 2014-01-02: 0.4 mg via INTRAVENOUS

## 2014-01-02 MED ORDER — TECHNETIUM TC 99M SESTAMIBI GENERIC - CARDIOLITE
30.0000 | Freq: Once | INTRAVENOUS | Status: AC | PRN
  Administered 2014-01-02: 30 via INTRAVENOUS

## 2014-01-02 NOTE — Progress Notes (Signed)
[facility] [address] [place], [place] [address] [phone]    Cardiology Nuclear Med Study  MARYROSE COLVIN is a 68 y.o. female     MRN : [id number]     DOB: 10/12/1946  Procedure Date: 01/02/2014  Nuclear Med Background Indication for Stress Test:  Evaluation for Ischemia and Abnormal EKG History:  No known CAD, Echo 2015 EF 55-60%, MPI (normal) 2009 Cardiac Risk Factors: Hypertension and Lipids  Symptoms:  Chest Pain, DOE and Palpitations   Nuclear Pre-Procedure Caffeine/Decaff Intake:  None > 12 hrs NPO After: 7:00pm   Lungs:  clear O2 Sat: 94% on room air. IV 0.9% NS with Angio Cath:  22g  IV Site: R Antecubital x 1, tolerated well IV Started by:  Irven Baltimore, RN  Chest Size (in):  42 Cup Size: C  Height: 5' 2.5" (1.588 m)  Weight:  229 lb (103.874 kg)  BMI:  Body mass index is 41.19 kg/(m^2). Tech Comments:  Micardis this am per patient.Irven Baltimore, RN.    Nuclear Med Study 1 or 2 day study: 2 day  Stress Test Type:  Treadmill/Lexiscan  Reading MD: N/A  Order Authorizing Provider:  Candee Furbish, MD  Resting Radionuclide: Technetium 1m Sestamibi  Resting Radionuclide Dose: 33.0 mCi on 01/08/14   Stress Radionuclide:  Technetium 6m Sestamibi  Stress Radionuclide Dose: 33.0 mCi on 01/02/14           Stress Protocol Rest HR: 61 Stress HR: 108  Rest BP: 154/88 Stress BP: 132/76  Exercise Time (min): n/a METS: n/a           Dose of Adenosine (mg):  n/a Dose of Lexiscan: 0.4 mg  Dose of Atropine (mg): n/a Dose of Dobutamine: n/a mcg/kg/min (at max HR)  Stress Test Technologist: Glade Lloyd, BS-ES  Nuclear Technologist:  Charlton Amor, CNMT     Rest Procedure:  Myocardial perfusion imaging was performed at rest 45 minutes following the intravenous administration of Technetium 73m Sestamibi. Rest ECG: Sinus rhythm, RVCD, nonspecific ST changes.  Stress Procedure:  The patient received IV Lexiscan 0.4 mg over 15-seconds with  concurrent low level exercise and then Technetium 26m Sestamibi was injected at 30-seconds while the patient continued walking one more minute.  Quantitative spect images were obtained after a 45-minute delay.  During the infusion of Lexiscan, the patient complained of stomach pain.  This began to resolve in recovery.  Stress ECG: No significant ST segment change suggestive of ischemia.  QPS Raw Data Images:  Acquisition technically good; normal left ventricular size. Stress Images:  There is decreased uptake in the apex. Rest Images:  There is decreased uptake in the apex. Subtraction (SDS):  No evidence of ischemia. Transient Ischemic Dilatation (Normal <1.22):  1.12 Lung/Heart Ratio (Normal <0.45):  0.29  Quantitative Gated Spect Images QGS EDV:  117 ml QGS ESV:  44 ml  Impression Exercise Capacity:  Lexiscan with low level exercise. BP Response:  Normal blood pressure response. Clinical Symptoms:  No chest pain or dyspnea. ECG Impression:  No significant ST segment change suggestive of ischemia. Comparison with Prior Nuclear Study: Compared to 07/10/08, apical defect more prominent  Overall Impression:  Low risk stress nuclear study with a small, severe, fixed apical defect consistent with apical thinning; no ischemia.  LV Ejection Fraction: 62%.  LV Wall Motion:  NL LV Function; NL Wall Motion  Ashley Pacheco

## 2014-01-08 ENCOUNTER — Ambulatory Visit (HOSPITAL_COMMUNITY): Payer: Medicare Other | Attending: Cardiovascular Disease

## 2014-01-08 ENCOUNTER — Encounter: Payer: Self-pay | Admitting: Cardiovascular Disease

## 2014-01-08 DIAGNOSIS — R0989 Other specified symptoms and signs involving the circulatory and respiratory systems: Secondary | ICD-10-CM

## 2014-01-08 MED ORDER — TECHNETIUM TC 99M SESTAMIBI GENERIC - CARDIOLITE
33.0000 | Freq: Once | INTRAVENOUS | Status: AC | PRN
  Administered 2014-01-08: 33 via INTRAVENOUS

## 2014-03-05 ENCOUNTER — Other Ambulatory Visit: Payer: Self-pay

## 2014-03-05 DIAGNOSIS — Z1231 Encounter for screening mammogram for malignant neoplasm of breast: Secondary | ICD-10-CM

## 2014-03-19 ENCOUNTER — Ambulatory Visit
Admission: RE | Admit: 2014-03-19 | Discharge: 2014-03-19 | Disposition: A | Discharge status: Home / Self Care | Payer: Medicare Other | Source: Ambulatory Visit

## 2014-03-19 ENCOUNTER — Encounter (INDEPENDENT_AMBULATORY_CARE_PROVIDER_SITE_OTHER): Payer: Self-pay

## 2014-03-19 DIAGNOSIS — Z1231 Encounter for screening mammogram for malignant neoplasm of breast: Secondary | ICD-10-CM | POA: Diagnosis not present

## 2014-03-26 DIAGNOSIS — M899 Disorder of bone, unspecified: Secondary | ICD-10-CM | POA: Diagnosis not present

## 2014-03-26 DIAGNOSIS — M949 Disorder of cartilage, unspecified: Secondary | ICD-10-CM | POA: Diagnosis not present

## 2014-03-26 DIAGNOSIS — Z1331 Encounter for screening for depression: Secondary | ICD-10-CM | POA: Diagnosis not present

## 2014-03-26 DIAGNOSIS — Z0000 Encounter for general adult medical examination without abnormal findings: Secondary | ICD-10-CM | POA: Diagnosis not present

## 2014-03-26 DIAGNOSIS — G479 Sleep disorder, unspecified: Secondary | ICD-10-CM | POA: Diagnosis not present

## 2014-03-26 DIAGNOSIS — I10 Essential (primary) hypertension: Secondary | ICD-10-CM | POA: Diagnosis not present

## 2014-03-26 DIAGNOSIS — Z23 Encounter for immunization: Secondary | ICD-10-CM | POA: Diagnosis not present

## 2014-03-26 DIAGNOSIS — E7800 Pure hypercholesterolemia, unspecified: Secondary | ICD-10-CM | POA: Diagnosis not present

## 2014-03-26 DIAGNOSIS — Z79899 Other long term (current) drug therapy: Secondary | ICD-10-CM | POA: Diagnosis not present

## 2014-03-26 DIAGNOSIS — E559 Vitamin D deficiency, unspecified: Secondary | ICD-10-CM | POA: Diagnosis not present

## 2014-04-24 DIAGNOSIS — M899 Disorder of bone, unspecified: Secondary | ICD-10-CM | POA: Diagnosis not present

## 2014-04-24 DIAGNOSIS — M949 Disorder of cartilage, unspecified: Secondary | ICD-10-CM | POA: Diagnosis not present

## 2014-05-26 DIAGNOSIS — M545 Low back pain, unspecified: Secondary | ICD-10-CM | POA: Diagnosis not present

## 2014-05-27 ENCOUNTER — Other Ambulatory Visit: Payer: Self-pay | Admitting: Family Medicine

## 2014-05-27 ENCOUNTER — Ambulatory Visit
Admission: RE | Admit: 2014-05-27 | Discharge: 2014-05-27 | Disposition: A | Discharge status: Home / Self Care | Payer: Medicare Other | Source: Ambulatory Visit | Attending: Family Medicine | Admitting: Family Medicine

## 2014-05-27 DIAGNOSIS — K8020 Calculus of gallbladder without cholecystitis without obstruction: Secondary | ICD-10-CM | POA: Diagnosis not present

## 2014-05-27 DIAGNOSIS — D259 Leiomyoma of uterus, unspecified: Secondary | ICD-10-CM | POA: Diagnosis not present

## 2014-05-27 DIAGNOSIS — R319 Hematuria, unspecified: Secondary | ICD-10-CM

## 2014-07-10 DIAGNOSIS — L723 Sebaceous cyst: Secondary | ICD-10-CM | POA: Diagnosis not present

## 2014-08-03 DIAGNOSIS — Z23 Encounter for immunization: Secondary | ICD-10-CM | POA: Diagnosis not present

## 2014-08-26 ENCOUNTER — Encounter: Payer: Self-pay | Admitting: Cardiology

## 2014-11-15 ENCOUNTER — Ambulatory Visit: Payer: Medicare Other | Admitting: Obstetrics & Gynecology

## 2014-11-18 ENCOUNTER — Ambulatory Visit: Payer: Medicare Other | Admitting: Obstetrics & Gynecology

## 2015-01-02 DIAGNOSIS — Z961 Presence of intraocular lens: Secondary | ICD-10-CM | POA: Diagnosis not present

## 2015-04-04 DIAGNOSIS — G479 Sleep disorder, unspecified: Secondary | ICD-10-CM | POA: Diagnosis not present

## 2015-04-04 DIAGNOSIS — Z1389 Encounter for screening for other disorder: Secondary | ICD-10-CM | POA: Diagnosis not present

## 2015-04-04 DIAGNOSIS — E780 Pure hypercholesterolemia: Secondary | ICD-10-CM | POA: Diagnosis not present

## 2015-04-04 DIAGNOSIS — E559 Vitamin D deficiency, unspecified: Secondary | ICD-10-CM | POA: Diagnosis not present

## 2015-04-04 DIAGNOSIS — M8588 Other specified disorders of bone density and structure, other site: Secondary | ICD-10-CM | POA: Diagnosis not present

## 2015-04-04 DIAGNOSIS — Z79899 Other long term (current) drug therapy: Secondary | ICD-10-CM | POA: Diagnosis not present

## 2015-04-04 DIAGNOSIS — Z23 Encounter for immunization: Secondary | ICD-10-CM | POA: Diagnosis not present

## 2015-04-04 DIAGNOSIS — F329 Major depressive disorder, single episode, unspecified: Secondary | ICD-10-CM | POA: Diagnosis not present

## 2015-04-04 DIAGNOSIS — I10 Essential (primary) hypertension: Secondary | ICD-10-CM | POA: Diagnosis not present

## 2015-04-04 DIAGNOSIS — Z0000 Encounter for general adult medical examination without abnormal findings: Secondary | ICD-10-CM | POA: Diagnosis not present

## 2015-06-27 ENCOUNTER — Ambulatory Visit (INDEPENDENT_AMBULATORY_CARE_PROVIDER_SITE_OTHER): Payer: Medicare Other | Admitting: Obstetrics & Gynecology

## 2015-06-27 ENCOUNTER — Encounter: Payer: Self-pay | Admitting: Obstetrics & Gynecology

## 2015-06-27 VITALS — BP 132/84 | HR 68 | Resp 16 | Ht 61.5 in | Wt 231.0 lb

## 2015-06-27 DIAGNOSIS — Z124 Encounter for screening for malignant neoplasm of cervix: Secondary | ICD-10-CM | POA: Diagnosis not present

## 2015-06-27 DIAGNOSIS — Z0000 Encounter for general adult medical examination without abnormal findings: Secondary | ICD-10-CM | POA: Diagnosis not present

## 2015-06-27 DIAGNOSIS — E2839 Other primary ovarian failure: Secondary | ICD-10-CM | POA: Diagnosis not present

## 2015-06-27 DIAGNOSIS — Z01419 Encounter for gynecological examination (general) (routine) without abnormal findings: Secondary | ICD-10-CM | POA: Diagnosis not present

## 2015-06-27 LAB — POCT URINALYSIS DIPSTICK
Bilirubin, UA: NEGATIVE
Blood, UA: NEGATIVE
GLUCOSE UA: NEGATIVE
KETONES UA: NEGATIVE
Nitrite, UA: NEGATIVE
Protein, UA: NEGATIVE
Urobilinogen, UA: NEGATIVE
pH, UA: 5

## 2015-06-27 NOTE — Progress Notes (Signed)
69 y.o. G2P2 MarriedCaucasianF here for annual exam.  Doing well.  Pt reports having a kidney stone last year.  Didn't see specialist.  Passed it on her own.  Had no blood in her urine at that time.    Denies vaginal bleeding.  Biggest issue for her if knee pain.    PCP:  Dr. Drema Dallas.  Last visit was May.  Blood work was done then.    Patient's last menstrual period was 10/26/1999.          Sexually active: No.  The current method of family planning is tubal ligation.    Exercising: No.  not regularly Smoker:  Former smoker-quit in [place] Maintenance: Pap:  08/03/12 WNL History of abnormal Pap:  no MMG:  03/19/14 BiRads 1-negative.  Pt aware is due.  States she will schedule. Colonoscopy:  2013-repeat in 5 years Dr Collene Mares BMD:   09/23/10, -1.2 TDaP:  PCP Screening Labs: PCP, Hb today: PCP, Urine today: WBC-1+   reports that she has quit smoking. She has never used smokeless tobacco. She reports that she drinks alcohol. She reports that she does not use illicit drugs.  Past Medical History  Diagnosis Date  . SUI (stress urinary incontinence, female) 9/91  . Vulvar cyst 7/90  . Menometrorrhagia 3/92    menopausal  . Microhematuria 1/96  . Fibroid   . Hypertension   . Cardiomegaly 2005  . DVT (deep venous thrombosis) 02/2004  . 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 Tonsillectomy and adenoidectomy    . Appendectomy    . Tubal ligation  1975    BTL  . Dilation and curettage of uterus      hystreroscopy/imcomplete polypectomy  . Cataracts  3/09 4/09  . Bunionectomy  7/11    second toe  . Knee surgery  10/13 or 11/13    right knee arthroscopy    Current Outpatient Prescriptions  Medication Sig Dispense Refill  . Acetaminophen (TYLENOL 8 HOUR PO) Take by mouth as needed.    . Ascorbic Acid (VITAMIN C PO) Take by mouth as needed.     [name] aspirin 81 MG tablet Take 81 mg by mouth daily.    . Calcium Carbonate (CALCIUM 600 PO) Take  600 mg by mouth daily.     . Cholecalciferol (VITAMIN D) 2000 UNITS tablet Take 2,000 Units by mouth 2 (two) times daily.     . DiphenhydrAMINE HCl (BENADRYL ALLERGY PO) Take by mouth as needed.    . fluticasone (CUTIVATE) 0.05 % cream Apply 1 application topically daily.    [name] glucosamine-chondroitin 500-400 MG tablet Take 1 tablet by mouth as needed.     . Ibuprofen (ADVIL PO) Take by mouth as needed.    . lovastatin (MEVACOR) 40 MG tablet Take 40 mg by mouth at bedtime.     . Methylsulfonylmethane (MSM PO) Take by mouth as needed.    [name] MICARDIS 80 MG tablet Take 80 mg by mouth daily.     . Multiple Vitamin (MULTI-VITAMIN DAILY PO) Take 1 tablet by mouth daily.     . sertraline (ZOLOFT) 100 MG tablet Take 100 mg by mouth daily.    . traZODone (DESYREL) 150 MG tablet 150 mg at bedtime.      No current facility-administered medications for this visit.    Family History  Problem Relation Age of Onset  . Skin cancer Other  both siblings  . Cancer Father     stomach and prostate  . Hypertension Mother   . Asthma Mother   . Kidney Stones Mother   . Kidney Stones Father     ROS:  Pertinent items are noted in HPI.  Otherwise, a comprehensive ROS was negative.  Exam:   BP 132/84 mmHg  Pulse 68  Resp 16  Ht 5' 1.5" (1.562 m)  Wt 231 lb (104.781 kg)  BMI 42.95 kg/m2  LMP 10/26/1999  Weight change: +1#   Height: 5' 1.5" (156.2 cm)  Ht Readings from Last 3 Encounters:  06/27/15 5' 1.5" (1.562 m)  01/02/14 5' 2.5" (1.588 m)  12/21/13 5' 2.5" (1.588 m)    General appearance: alert, cooperative and appears stated age Head: Normocephalic, without obvious abnormality, atraumatic Neck: no adenopathy, supple, symmetrical, trachea midline and thyroid normal to inspection and palpation Lungs: clear to auscultation bilaterally Breasts: normal appearance, no masses or tenderness Heart: regular rate and rhythm Abdomen: soft, non-tender; bowel sounds normal; no masses,  no  organomegaly Extremities: extremities normal, atraumatic, no cyanosis or edema Skin: Skin color, texture, turgor normal. No rashes or lesions Lymph nodes: Cervical, supraclavicular, and axillary nodes normal. No abnormal inguinal nodes palpated Neurologic: Grossly normal   Pelvic: External genitalia:  no lesions              Urethra:  normal appearing urethra with no masses, tenderness or lesions              Bartholins and Skenes: normal                 Vagina: normal appearing vagina with normal color and discharge, no lesions              Cervix: no lesions              Pap taken: Yes.   Bimanual Exam:  Uterus:  normal size, contour, position, consistency, mobility, non-tender              Adnexa: normal adnexa and no mass, fullness, tenderness               Rectovaginal: Confirms               Anus:  normal sphincter tone, no lesions  Chaperone was present for exam.  A:  Well Woman with normal exam Elevated lipids Hypertension Obesity Osteopenia Low Vit D Knee pain H/o DVT, no estrogen usage  P: Mammogram due. Pt will schedule this. Declines having me schedule this for her today. BMD order placed.  Pt will do with her MMG.   Labs with Dr. Drema Dallas yearly.  pap smear 10/13, neg. Pap today. return annually or prn

## 2015-07-01 LAB — IPS PAP SMEAR ONLY

## 2015-07-23 DIAGNOSIS — Z23 Encounter for immunization: Secondary | ICD-10-CM | POA: Diagnosis not present

## 2015-09-04 DIAGNOSIS — M170 Bilateral primary osteoarthritis of knee: Secondary | ICD-10-CM | POA: Diagnosis not present

## 2015-10-01 DIAGNOSIS — M170 Bilateral primary osteoarthritis of knee: Secondary | ICD-10-CM | POA: Diagnosis not present

## 2015-10-08 DIAGNOSIS — M170 Bilateral primary osteoarthritis of knee: Secondary | ICD-10-CM | POA: Diagnosis not present

## 2015-10-21 DIAGNOSIS — M1712 Unilateral primary osteoarthritis, left knee: Secondary | ICD-10-CM | POA: Diagnosis not present

## 2015-10-21 DIAGNOSIS — M1711 Unilateral primary osteoarthritis, right knee: Secondary | ICD-10-CM | POA: Diagnosis not present

## 2015-10-28 DIAGNOSIS — M1712 Unilateral primary osteoarthritis, left knee: Secondary | ICD-10-CM | POA: Diagnosis not present

## 2015-10-28 DIAGNOSIS — M1711 Unilateral primary osteoarthritis, right knee: Secondary | ICD-10-CM | POA: Diagnosis not present

## 2015-11-11 DIAGNOSIS — M1712 Unilateral primary osteoarthritis, left knee: Secondary | ICD-10-CM | POA: Diagnosis not present

## 2015-11-11 DIAGNOSIS — M1711 Unilateral primary osteoarthritis, right knee: Secondary | ICD-10-CM | POA: Diagnosis not present

## 2016-04-05 DIAGNOSIS — R739 Hyperglycemia, unspecified: Secondary | ICD-10-CM | POA: Diagnosis not present

## 2016-04-05 DIAGNOSIS — E7800 Pure hypercholesterolemia, unspecified: Secondary | ICD-10-CM | POA: Diagnosis not present

## 2016-04-05 DIAGNOSIS — I10 Essential (primary) hypertension: Secondary | ICD-10-CM | POA: Diagnosis not present

## 2016-04-05 DIAGNOSIS — E559 Vitamin D deficiency, unspecified: Secondary | ICD-10-CM | POA: Diagnosis not present

## 2016-04-15 DIAGNOSIS — Z0000 Encounter for general adult medical examination without abnormal findings: Secondary | ICD-10-CM | POA: Diagnosis not present

## 2016-04-15 DIAGNOSIS — M8588 Other specified disorders of bone density and structure, other site: Secondary | ICD-10-CM | POA: Diagnosis not present

## 2016-04-15 DIAGNOSIS — I10 Essential (primary) hypertension: Secondary | ICD-10-CM | POA: Diagnosis not present

## 2016-04-15 DIAGNOSIS — R0789 Other chest pain: Secondary | ICD-10-CM | POA: Diagnosis not present

## 2016-04-15 DIAGNOSIS — G479 Sleep disorder, unspecified: Secondary | ICD-10-CM | POA: Diagnosis not present

## 2016-04-15 DIAGNOSIS — Z1159 Encounter for screening for other viral diseases: Secondary | ICD-10-CM | POA: Diagnosis not present

## 2016-04-15 DIAGNOSIS — E7800 Pure hypercholesterolemia, unspecified: Secondary | ICD-10-CM | POA: Diagnosis not present

## 2016-04-15 DIAGNOSIS — Z1389 Encounter for screening for other disorder: Secondary | ICD-10-CM | POA: Diagnosis not present

## 2016-04-15 DIAGNOSIS — Z6841 Body Mass Index (BMI) 40.0 and over, adult: Secondary | ICD-10-CM | POA: Diagnosis not present

## 2016-04-15 DIAGNOSIS — R7301 Impaired fasting glucose: Secondary | ICD-10-CM | POA: Diagnosis not present

## 2016-04-15 DIAGNOSIS — E559 Vitamin D deficiency, unspecified: Secondary | ICD-10-CM | POA: Diagnosis not present

## 2016-04-15 DIAGNOSIS — F339 Major depressive disorder, recurrent, unspecified: Secondary | ICD-10-CM | POA: Diagnosis not present

## 2016-05-06 DIAGNOSIS — M8588 Other specified disorders of bone density and structure, other site: Secondary | ICD-10-CM | POA: Diagnosis not present

## 2016-05-25 ENCOUNTER — Ambulatory Visit (INDEPENDENT_AMBULATORY_CARE_PROVIDER_SITE_OTHER): Payer: Medicare Other | Admitting: Obstetrics & Gynecology

## 2016-05-25 ENCOUNTER — Other Ambulatory Visit: Payer: Self-pay | Admitting: Family Medicine

## 2016-05-25 VITALS — BP 130/88 | HR 72 | Temp 98.9°F | Resp 18 | Ht 61.5 in | Wt 219.0 lb

## 2016-05-25 DIAGNOSIS — N644 Mastodynia: Secondary | ICD-10-CM

## 2016-05-25 DIAGNOSIS — Z1231 Encounter for screening mammogram for malignant neoplasm of breast: Secondary | ICD-10-CM

## 2016-05-25 NOTE — Progress Notes (Signed)
Patient is scheduled for Bilateral Breast Diagnostic Mammogram and R Breast Ultrasound at [facility] imaging on 05/28/16 at 0800 . Patient agreeable to time/date/location.

## 2016-05-25 NOTE — Progress Notes (Signed)
   Subjective:   70 y.o. Widowed Caucasian female presents for evaluation of right breast.  She noted about a week ago a "burning sensation" when she moves her left arm.  Does not feel any masses or nipple discharge.  Has not had a MMG since 2015.  She tried to call and schedule a mammogram and she was told she needed to be referred as she was having a problem.  Denies skin changes, masses, or bruising.  Denies recent trauma.  Review of Systems  All other systems reviewed and are negative.    Objective:   General appearance: alert and cooperative Head: Normocephalic, without obvious abnormality, atraumatic Neck: no adenopathy and thyroid not enlarged, symmetric, no tenderness/mass/nodules Lungs: clear to auscultation bilaterally Breasts: normal appearance, no masses or tenderness Heart: regular rate and rhythm, S1, S2 normal, no murmur, click, rub or gallop   Assessment:   Burning sensation on right breast, no recent trauma Last MMG 2015    Plan:   Diagnostic MMG will be ordered and performed.  Follow up may be needed depending on results.

## 2016-05-27 ENCOUNTER — Encounter: Payer: Self-pay | Admitting: Obstetrics & Gynecology

## 2016-05-28 ENCOUNTER — Ambulatory Visit
Admission: RE | Admit: 2016-05-28 | Discharge: 2016-05-28 | Disposition: A | Discharge status: Home / Self Care | Payer: Medicare Other | Source: Ambulatory Visit | Attending: Obstetrics & Gynecology | Admitting: Obstetrics & Gynecology

## 2016-05-28 DIAGNOSIS — N644 Mastodynia: Secondary | ICD-10-CM

## 2016-05-28 DIAGNOSIS — R928 Other abnormal and inconclusive findings on diagnostic imaging of breast: Secondary | ICD-10-CM | POA: Diagnosis not present

## 2016-06-07 ENCOUNTER — Telehealth: Payer: Self-pay | Admitting: *Deleted

## 2016-06-07 NOTE — Telephone Encounter (Signed)
-----   Message from Megan Salon, MD sent at 06/01/2016  6:20 PM EDT ----- Please call pt and let her know MMG was normal.  Probably already knows this as was scheduled for diagnostic imaging.  If still having same burning sensation, I'd like to repeat exam in 1 month to give a little time to see if will just resolve.

## 2016-06-07 NOTE — Telephone Encounter (Signed)
Call to patient. VM confirms names. Left message to call back. Left message nothing wrong. Follow-up call. Can speak to [place] or [place].

## 2016-06-08 NOTE — Telephone Encounter (Signed)
Spoke with patient. Advised of message as seen below from [place]. Patient states that the burning sensation in her breast has resolved. Aware if this sensation returns she will need to contact the office to schedule a breast recheck. She is agreeable and verbalizes understanding.  Routing to provider for final review. Patient agreeable to disposition. Will close encounter.

## 2016-08-12 DIAGNOSIS — R404 Transient alteration of awareness: Secondary | ICD-10-CM | POA: Diagnosis not present

## 2016-08-12 DIAGNOSIS — K148 Other diseases of tongue: Secondary | ICD-10-CM | POA: Diagnosis not present

## 2016-08-12 DIAGNOSIS — R220 Localized swelling, mass and lump, head: Secondary | ICD-10-CM | POA: Diagnosis not present

## 2016-08-13 ENCOUNTER — Encounter: Payer: Self-pay | Admitting: Diagnostic Neuroimaging

## 2016-08-13 ENCOUNTER — Telehealth: Payer: Self-pay | Admitting: Diagnostic Neuroimaging

## 2016-08-13 ENCOUNTER — Ambulatory Visit
Admission: RE | Admit: 2016-08-13 | Discharge: 2016-08-13 | Disposition: A | Discharge status: Home / Self Care | Payer: Medicare Other | Source: Ambulatory Visit | Attending: Diagnostic Neuroimaging | Admitting: Diagnostic Neuroimaging

## 2016-08-13 ENCOUNTER — Ambulatory Visit (INDEPENDENT_AMBULATORY_CARE_PROVIDER_SITE_OTHER): Payer: Medicare Other | Admitting: Diagnostic Neuroimaging

## 2016-08-13 VITALS — BP 146/90 | HR 68 | Ht 61.5 in | Wt 220.4 lb

## 2016-08-13 DIAGNOSIS — H539 Unspecified visual disturbance: Secondary | ICD-10-CM

## 2016-08-13 DIAGNOSIS — F05 Delirium due to known physiological condition: Secondary | ICD-10-CM

## 2016-08-13 DIAGNOSIS — H538 Other visual disturbances: Secondary | ICD-10-CM | POA: Diagnosis not present

## 2016-08-13 MED ORDER — GADOBENATE DIMEGLUMINE 529 MG/ML IV SOLN
20.0000 mL | Freq: Once | INTRAVENOUS | Status: AC | PRN
  Administered 2016-08-13: 20 mL via INTRAVENOUS

## 2016-08-13 MED ORDER — LEVETIRACETAM 500 MG PO TABS: 500.0000 mg | ORAL_TABLET | Freq: Two times a day (BID) | ORAL | 11 refills | Status: DC

## 2016-08-13 NOTE — Telephone Encounter (Signed)
ERROR

## 2016-08-13 NOTE — Patient Instructions (Signed)
-   I will check MRI brain and EEG.  - I will start levetiracetam 500mg  twice a day.  - According to [place] law, you can not drive unless you are seizure free for at least 6 months and under physician's care.   - Please maintain seizure precautions. Do not participate in activities where a loss of awareness could harm you or someone else. No swimming alone, no tub bathing, no hot tubs, no driving, no operating motorized vehicles (cars, ATVs, motocycles, etc), lawnmowers or power tools. No standing at heights, such as rooftops, ladders or stairs. Avoid hot objects such as stoves, heaters, open fires. Wear a helmet when riding a bicycle, scooter, skateboard, etc. and avoid areas of traffic. Set your water heater to 120 degrees or less.

## 2016-08-13 NOTE — Progress Notes (Signed)
[facility]  PATIENT: Ashley Pacheco DOB: April 16, 1946  REFERRING CLINICIAN: Edwin Dada HISTORY FROM: patient  REASON FOR VISIT: new consult    HISTORICAL  CHIEF COMPLAINT:  Chief Complaint  Patient presents with  . Possible seizure    rm 7, New Pt, "possible seizure on 08/10/16, bit my tongue, tablet screen got wavy, then next thing I know I am waking up"    HISTORY OF PRESENT ILLNESS:   70 year old right-handed female here for evaluation of possible seizure. Patient has history of hypertension, hyperglycemia depression and heart flutter. 08/10/16, patient was at home playing solitaire on her tablet. She was sitting in a lounge chair where she sometimes takes a nap in the afternoon. Around 2 PM patient noticed that this screen on the tablet was "wavy". Patient looked away from the tablet and saw an afterimage of the tablet follow her visual tracking to the right side. Patient then tried to use the TV remote to turn off the television. She tried pressing a few buttons was unable to turn off the TV. The next thing she remembered she was waking up, noticed that the TV remote was on the floor and the television was still on. She then noticed that her tongue and throat were very sore. She got up and looked in the mirror she found that she had severely bitten her tongue on both sides. No incontinence. No definite postictal confusion. She did not feel sore in her arms or legs.  In retrospect patient had a similar but minor event in spring of 2017 where she was using her tablet and saw a "wavy screen" lasting for approximately 1 minute. She did not have tongue biting or any other symptoms with that episode.  Patient has normal worth and development mental history. No history of seizure in the past. No head trauma, meningitis or encephalitis in the past. No family history of seizure. Patient lives alone, is fairly active, takes care of all ADLs. No recent change in medication,  alcohol, sleep, stress or other events.   REVIEW OF SYSTEMS: Full 14 system review of systems performed and negative with exception of: Palpitations swelling in legs trouble swallowing joint pain cramps depression easy bruising blurred vision.  ALLERGIES: Allergies  Allergen Reactions  . Latex     bandaids cause rash    HOME MEDICATIONS: Outpatient Medications Prior to Visit  Medication Sig Dispense Refill  . Acetaminophen (TYLENOL 8 HOUR PO) Take by mouth as needed.    . Ascorbic Acid (VITAMIN C PO) Take by mouth as needed.     [name] aspirin 81 MG tablet Take 81 mg by mouth daily.    . Cholecalciferol (VITAMIN D) 2000 UNITS tablet Take 2,000 Units by mouth 2 (two) times daily.     . DiphenhydrAMINE HCl (BENADRYL ALLERGY PO) Take by mouth as needed.    . Ibuprofen (ADVIL PO) Take by mouth as needed.    . Iron Combinations (IRON COMPLEX PO) Take by mouth.    . lovastatin (MEVACOR) 40 MG tablet Take 40 mg by mouth at bedtime.     [name] MICARDIS 80 MG tablet Take 80 mg by mouth daily.     . Multiple Vitamin (MULTI-VITAMIN DAILY PO) Take 1 tablet by mouth daily.     . sertraline (ZOLOFT) 100 MG tablet Take 100 mg by mouth daily.    . traZODone (DESYREL) 150 MG tablet 150 mg at bedtime.     [name] glucosamine-chondroitin 500-400 MG tablet Take 1 tablet  by mouth as needed.      No facility-administered medications prior to visit.     PAST MEDICAL HISTORY: Past Medical History:  Diagnosis Date  . 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 Cardiomegaly 2005  . DVT (deep venous thrombosis) ([place]) 02/2004  . Fibroid   . Hypercholesteremia   . Hypertension   . Menometrorrhagia 3/92   menopausal  . Microhematuria 1/96  . Nephrolithiasis 2015  . Periodic heart flutter   . SUI (stress urinary incontinence, female) 9/91  . Vulvar cyst 7/90    PAST SURGICAL HISTORY: Past Surgical History:  Procedure Laterality Date  . APPENDECTOMY  1950  . BUNIONECTOMY  7/11   second toe  . cataracts  3/09 4/09  . DILATION  AND CURETTAGE OF UTERUS     hystreroscopy/imcomplete polypectomy  . KNEE SURGERY  10/13 or 11/13   right knee arthroscopy  . TONSILLECTOMY AND ADENOIDECTOMY  1950  . TUBAL LIGATION  1975   BTL    FAMILY HISTORY: Family History  Problem Relation Age of Onset  . Skin cancer Other     both siblings  . Cancer Father     stomach and prostate  . Kidney Stones Father   . Hypertension Mother   . Asthma Mother   . Kidney Stones Mother   . Stroke Maternal Uncle   . Heart attack Maternal Uncle   . Hypertension Maternal Uncle   . Cancer Paternal Uncle   . Stroke Maternal Grandfather     SOCIAL HISTORY:  Social History   Social History  . Marital status: Widowed    Spouse name: N/A  . Number of children: 2  . Years of education: 16   Occupational History  .      volunteer   Social History Main Topics  . Smoking status: Former Research scientist (life sciences)  . Smokeless tobacco: Never Used     Comment: quit in 1969  . Alcohol use 0.0 oz/week     Comment: periodically  . Drug use: No  . Sexual activity: No   Other Topics Concern  . Not on file   Social History Narrative   Lives alone   Caffeine- coffee- 3 mugs daily     PHYSICAL EXAM  GENERAL EXAM/CONSTITUTIONAL: Vitals:  Vitals:   08/13/16 1142  BP: (!) 146/90  Pulse: 68  Weight: 220 lb 6.4 oz (100 kg)  Height: 5' 1.5" (1.562 m)     Body mass index is 40.97 kg/m.  Visual Acuity Screening   Right eye Left eye Both eyes  Without correction:     With correction: 20/30 20/40      Patient is in no distress; well developed, nourished and groomed; neck is supple  CARDIOVASCULAR:  Examination of carotid arteries is normal; no carotid bruits  Regular rate and rhythm, no murmurs  Examination of peripheral vascular system by observation and palpation is normal  EYES:  Ophthalmoscopic exam of optic discs and posterior segments is normal; no papilledema or hemorrhages  MUSCULOSKELETAL:  Gait, strength, tone, movements  noted in Neurologic exam below  NEUROLOGIC: MENTAL STATUS:  No flowsheet data found.  awake, alert, oriented to person, place and time  recent and remote memory intact  normal attention and concentration  language fluent, comprehension intact, naming intact,   fund of knowledge appropriate  CRANIAL NERVE:   2nd - no papilledema on fundoscopic exam  2nd, 3rd, 4th, 6th - pupils equal and reactive to light, visual fields full to confrontation, extraocular  muscles intact, no nystagmus  5th - facial sensation symmetric  7th - facial strength symmetric  8th - hearing intact  9th - palate elevates symmetrically, uvula midline  11th - shoulder shrug symmetric  12th - tongue protrusion midline  MOTOR:   normal bulk and tone, full strength in the BUE, BLE  SENSORY:   normal and symmetric to light touch, temperature, vibration  COORDINATION:   finger-nose-finger, fine finger movements normal  REFLEXES:   deep tendon reflexes present and symmetric  GAIT/STATION:   narrow based gait; able to walk tandem; romberg is negative    DIAGNOSTIC DATA (LABS, IMAGING, TESTING) - I reviewed patient records, labs, notes, testing and imaging myself where available.  No results found for: WBC, HGB, HCT, MCV, PLT No results found for: NA, K, CL, CO2, GLUCOSE, BUN, CREATININE, CALCIUM, PROT, ALBUMIN, AST, ALT, ALKPHOS, BILITOT, GFRNONAA, GFRAA No results found for: CHOL, HDL, LDLCALC, LDLDIRECT, TRIG, CHOLHDL No results found for: HGBA1C No results found for: VITAMINB12 No results found for: TSH      ASSESSMENT AND PLAN  70 y.o. year old female here with new onset episode on 08/10/16 of transient vision changes, confusion with operating the television remote control, unprovoked loss of consciousness, tongue biting. Patient had a similar transient vision change episode 6 months ago without the additional symptoms.   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 Will proceed with further workup with MRI brain and EEG. Patient had a CT scan scheduled for today but I was able to change this to an MRI order with the help of my staff. Also advised patient on safety considerations including minimum of 6 months of driving restriction. Also will start empiric antiseizure medication with levetiracetam 500 mg twice a day, as this episode on 08/10/16 may have been the second event in her life.   Ddx: occipital seizure vs TIA vs atypical migraine  1. Vision changes   2. Acute confusional state     PLAN:  - I will check MRI brain and EEG.  - I will start levetiracetam 500mg  twice a day.  - According to [place] law, you can not drive unless you are seizure free for at least 6 months and under physician's care.   - Please maintain seizure precautions. Do not participate in activities where a loss of awareness could harm you or someone else. No swimming alone, no tub bathing, no hot tubs, no driving, no operating motorized vehicles (cars, ATVs, motocycles, etc), lawnmowers or power tools. No standing at heights, such as rooftops, ladders or stairs. Avoid hot objects such as stoves, heaters, open fires. Wear a helmet when riding a bicycle, scooter, skateboard, etc. and avoid areas of traffic. Set your water heater to 120 degrees or less.  Orders Placed This Encounter  Procedures  . MR BRAIN W WO CONTRAST  . EEG adult   Meds ordered this encounter  Medications  . levETIRAcetam (KEPPRA) 500 MG tablet    Sig: Take 1 tablet (500 mg total) by mouth 2 (two) times daily.    Dispense:  60 tablet    Refill:  11   Return in about 6 weeks (around 09/24/2016).  I reviewed notes, records myself. I summarized findings and reviewed with patient, for this high risk condition (possible new onset seizure) requiring high complexity decision making.    Penni Bombard, MD [id number], [id number] PM Certified in Neurology, Neurophysiology and  Neuroimaging  [facility] [address]  [place], [place] [address] [phone]

## 2016-08-13 NOTE — Telephone Encounter (Signed)
I called patient to review MRI results which show no major findings. Reached VM x 2. Will try again on Monday. -VRP

## 2016-08-16 ENCOUNTER — Telehealth: Payer: Self-pay | Admitting: *Deleted

## 2016-08-16 NOTE — Telephone Encounter (Signed)
Spoke with patient, per Dr Leta Baptist and informed her that her MRI brain results are unremarkable, no acute or concerning findings. Advised her Dr Leta Baptist will continue with her current treatment plan. Reminded her of EEG apt next month, FU with Dr Leta Baptist. Advised she will get a call with EEG results before her FU. She inquired about driving; advised she should not drive per [place] law and Dr Gladstone Lighter instructions. Advised that EEG results need to be obtained and FU with Dr Leta Baptist to discuss driving further. She verbalized understanding, appreciation.

## 2016-08-30 ENCOUNTER — Ambulatory Visit (INDEPENDENT_AMBULATORY_CARE_PROVIDER_SITE_OTHER): Payer: Medicare Other | Admitting: Diagnostic Neuroimaging

## 2016-08-30 DIAGNOSIS — R410 Disorientation, unspecified: Secondary | ICD-10-CM

## 2016-08-30 DIAGNOSIS — H539 Unspecified visual disturbance: Secondary | ICD-10-CM

## 2016-08-30 DIAGNOSIS — F05 Delirium due to known physiological condition: Secondary | ICD-10-CM

## 2016-09-06 NOTE — Procedures (Signed)
   [facility]  EEG (ELECTROENCEPHALOGRAM) REPORT   STUDY DATE: 08/30/16 PATIENT NAME: Ashley Pacheco DOB: Jul 27, 1946 MRN: [id number]  ORDERING CLINICIAN: Andrey Spearman, MD   TECHNOLOGIST: Laretta Alstrom  TECHNIQUE: Electroencephalogram was recorded utilizing standard 10-20 system of lead placement and reformatted into average and bipolar montages.  RECORDING TIME: 22 minutes  ACTIVATION: hyperventilation and photic stimulation  CLINICAL INFORMATION: 70 year old female with new onset seizure.  FINDINGS: Background rhythms of 9-10 hertz and 20-30 microvolts. No focal, lateralizing, epileptiform activity or seizures are seen. Patient recorded in the awake and drowsy state. EKG channel shows regular rhythm of 60-70 beats per minute.  IMPRESSION:  Normal EEG in the awake and drowsy states.    INTERPRETING PHYSICIAN:  Penni Bombard, MD Certified in Neurology, Neurophysiology and Neuroimaging  [facility] [address] [place], [place] [address] [phone]

## 2016-09-09 ENCOUNTER — Telehealth: Payer: Self-pay | Admitting: *Deleted

## 2016-09-09 NOTE — Telephone Encounter (Signed)
LVM informing patient, per Dr Leta Baptist, her EEG is normal. Advised she continue taking Levetiracetam no driving. Reminded her of follow up on 10/11/16 when she can discuss MRI, EEG in detail and update dr on how she has been doing on medication. Left name and number for any questions.

## 2016-10-01 ENCOUNTER — Encounter: Payer: Self-pay | Admitting: Diagnostic Neuroimaging

## 2016-10-01 ENCOUNTER — Ambulatory Visit (INDEPENDENT_AMBULATORY_CARE_PROVIDER_SITE_OTHER): Payer: Medicare Other | Admitting: Diagnostic Neuroimaging

## 2016-10-01 VITALS — BP 134/87 | HR 69 | Wt 222.8 lb

## 2016-10-01 DIAGNOSIS — G40909 Epilepsy, unspecified, not intractable, without status epilepticus: Secondary | ICD-10-CM | POA: Diagnosis not present

## 2016-10-01 DIAGNOSIS — H539 Unspecified visual disturbance: Secondary | ICD-10-CM | POA: Diagnosis not present

## 2016-10-01 MED ORDER — LEVETIRACETAM 500 MG PO TABS: 500.0000 mg | ORAL_TABLET | Freq: Two times a day (BID) | ORAL | 4 refills | Status: DC

## 2016-10-01 NOTE — Progress Notes (Signed)
[facility]  PATIENT: Ashley Pacheco DOB: 1946/02/07  REFERRING CLINICIAN: Edwin Dada HISTORY FROM: patient  REASON FOR VISIT: follow up   HISTORICAL  CHIEF COMPLAINT:  Chief Complaint  Patient presents with  . Vision changes    rm 7, "vision- when I get tired, things get blurry, just tiredness; I read a lot."  . Follow-up    6 week, post MRI, EEG    HISTORY OF PRESENT ILLNESS:   UPDATE 10/01/16: Since last visit, doing well. No sz. Tolerating LEV. No other abnl spells. She has some snoring, but no sig daytime fatigue. She is concerned about risk of stroke due to her family history.   PRIOR HPI (08/13/16): 70 year old right-handed female here for evaluation of possible seizure. Patient has history of hypertension, hyperglycemia depression and heart flutter. 08/10/16, patient was at home playing solitaire on her tablet. She was sitting in a lounge chair where she sometimes takes a nap in the afternoon. Around 2 PM patient noticed that this screen on the tablet was "wavy". Patient looked away from the tablet and saw an afterimage of the tablet follow her visual tracking to the right side. Patient then tried to use the TV remote to turn off the television. She tried pressing a few buttons was unable to turn off the TV. The next thing she remembered she was waking up, noticed that the TV remote was on the floor and the television was still on. She then noticed that her tongue and throat were very sore. She got up and looked in the mirror she found that she had severely bitten her tongue on both sides. No incontinence. No definite postictal confusion. She did not feel sore in her arms or legs. In retrospect patient had a similar but minor event in spring of 2017 where she was using her tablet and saw a "wavy screen" lasting for approximately 1 minute. She did not have tongue biting or any other symptoms with that episode. Patient has normal worth and development mental history.  No history of seizure in the past. No head trauma, meningitis or encephalitis in the past. No family history of seizure. Patient lives alone, is fairly active, takes care of all ADLs. No recent change in medication, alcohol, sleep, stress or other events.   REVIEW OF SYSTEMS: Full 14 system review of systems performed and negative with exception of: eye itching/discharge/redness, joint pain, walking diff.    ALLERGIES: Allergies  Allergen Reactions  . Latex     bandaids cause rash    HOME MEDICATIONS: Outpatient Medications Prior to Visit  Medication Sig Dispense Refill  . Acetaminophen (TYLENOL 8 HOUR PO) Take by mouth as needed.    . Ascorbic Acid (VITAMIN C PO) Take by mouth as needed.     [name] aspirin 81 MG tablet Take 81 mg by mouth daily.    . Cholecalciferol (VITAMIN D) 2000 UNITS tablet Take 2,000 Units by mouth 2 (two) times daily.     . DiphenhydrAMINE HCl (BENADRYL ALLERGY PO) Take by mouth as needed.    . Ibuprofen (ADVIL PO) Take by mouth as needed.    . Iron Combinations (IRON COMPLEX PO) Take by mouth.    . levETIRAcetam (KEPPRA) 500 MG tablet Take 1 tablet (500 mg total) by mouth 2 (two) times daily. 60 tablet 11  . lovastatin (MEVACOR) 40 MG tablet Take 40 mg by mouth at bedtime.     [name] MICARDIS 80 MG tablet Take 80 mg by mouth daily.     [name]  Multiple Vitamin (MULTI-VITAMIN DAILY PO) Take 1 tablet by mouth daily.     . naproxen sodium (ANAPROX) 220 MG tablet Take 220 mg by mouth 2 (two) times daily with a meal. 08/13/16 takes 1-2 daily    . sertraline (ZOLOFT) 100 MG tablet Take 100 mg by mouth daily.    . traZODone (DESYREL) 150 MG tablet 150 mg at bedtime.      No facility-administered medications prior to visit.     PAST MEDICAL HISTORY: Past Medical History:  Diagnosis Date  . 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 Cardiomegaly 2005  . DVT (deep venous thrombosis) ([place]) 02/2004  . Fibroid   . Hypercholesteremia   . Hypertension   . Menometrorrhagia 3/92   menopausal  .  Microhematuria 1/96  . Nephrolithiasis 2015  . Periodic heart flutter   . SUI (stress urinary incontinence, female) 9/91  . Vulvar cyst 7/90    PAST SURGICAL HISTORY: Past Surgical History:  Procedure Laterality Date  . APPENDECTOMY  1950  . BUNIONECTOMY  7/11   second toe  . cataracts  3/09 4/09  . DILATION AND CURETTAGE OF UTERUS     hystreroscopy/imcomplete polypectomy  . KNEE SURGERY  10/13 or 11/13   right knee arthroscopy  . TONSILLECTOMY AND ADENOIDECTOMY  1950  . TUBAL LIGATION  1975   BTL    FAMILY HISTORY: Family History  Problem Relation Age of Onset  . Skin cancer Other     both siblings  . Cancer Father     stomach and prostate  . Kidney Stones Father   . Hypertension Mother   . Asthma Mother   . Kidney Stones Mother   . Stroke Maternal Uncle   . Heart attack Maternal Uncle   . Hypertension Maternal Uncle   . Cancer Paternal Uncle   . Stroke Maternal Grandfather     SOCIAL HISTORY:  Social History   Social History  . Marital status: Widowed    Spouse name: N/A  . Number of children: 2  . Years of education: 16   Occupational History  .      volunteer   Social History Main Topics  . Smoking status: Former Research scientist (life sciences)  . Smokeless tobacco: Never Used     Comment: quit in 1969  . Alcohol use 0.0 oz/week     Comment: periodically  . Drug use: No  . Sexual activity: No   Other Topics Concern  . Not on file   Social History Narrative   Lives alone   Caffeine- coffee- 3 mugs daily     PHYSICAL EXAM  GENERAL EXAM/CONSTITUTIONAL: Vitals:  Vitals:   10/01/16 0756  BP: 134/87  Pulse: 69  Weight: 222 lb 12.8 oz (101.1 kg)   Body mass index is 41.42 kg/m. No exam data present  Patient is in no distress; well developed, nourished and groomed; neck is supple  CARDIOVASCULAR:  Examination of carotid arteries is normal; no carotid bruits  Regular rate and rhythm, no murmurs  Examination of peripheral vascular system by observation  and palpation is normal  EYES:  Ophthalmoscopic exam of optic discs and posterior segments is normal; no papilledema or hemorrhages  MUSCULOSKELETAL:  Gait, strength, tone, movements noted in Neurologic exam below  NEUROLOGIC: MENTAL STATUS:  No flowsheet data found.  awake, alert, oriented to person, place and time  recent and remote memory intact  normal attention and concentration  language fluent, comprehension intact, naming intact,   fund of knowledge appropriate  CRANIAL NERVE:   2nd - no papilledema on fundoscopic exam  2nd, 3rd, 4th, 6th - pupils equal and reactive to light, visual fields full to confrontation, extraocular muscles intact, no nystagmus  5th - facial sensation symmetric  7th - facial strength symmetric  8th - hearing intact  9th - palate elevates symmetrically, uvula midline  11th - shoulder shrug symmetric  12th - tongue protrusion midline  MOTOR:   normal bulk and tone, full strength in the BUE, BLE  SENSORY:   normal and symmetric to light touch, temperature, vibration  COORDINATION:   finger-nose-finger, fine finger movements normal  REFLEXES:   deep tendon reflexes present and symmetric  GAIT/STATION:   narrow based gait    DIAGNOSTIC DATA (LABS, IMAGING, TESTING) - I reviewed patient records, labs, notes, testing and imaging myself where available.  No results found for: WBC, HGB, HCT, MCV, PLT No results found for: NA, K, CL, CO2, GLUCOSE, BUN, CREATININE, CALCIUM, PROT, ALBUMIN, AST, ALT, ALKPHOS, BILITOT, GFRNONAA, GFRAA No results found for: CHOL, HDL, LDLCALC, LDLDIRECT, TRIG, CHOLHDL No results found for: HGBA1C No results found for: VITAMINB12 No results found for: TSH  08/13/16 MRI brain (with and without) [I reviewed images myself and agree with interpretation. -VRP]  1. Mild periventricular and subcortical foci of chronic small vessel ischemic disease.  2. No acute findings.  08/30/16 EEG  -  normal    ASSESSMENT AND PLAN  70 y.o. year old female here with new onset episode on 08/10/16 of transient vision changes, confusion with operating the television remote control, unprovoked loss of consciousness, tongue biting. Patient had a similar transient vision change episode ~ April 2017, without the additional symptoms.  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  Now on empiric antiseizure medication with levetiracetam 500 mg twice a day, as this episode on 08/10/16 may have been the second event in her life.   Ddx: occipital seizure  1. Seizure disorder ([place])   2. Vision changes      PLAN: I spent 25 minutes of face to face time with patient. Greater than 50% of time was spent in counseling and coordination of care with patient. In summary we discussed:   - Continue levetiracetam 500mg  twice a day.  - According to [place] law, you can not drive unless you are seizure free for at least 6 months and under physician's care.   - Please maintain seizure precautions. Do not participate in activities where a loss of awareness could harm you or someone else. No swimming alone, no tub bathing, no hot tubs, no driving, no operating motorized vehicles (cars, ATVs, motocycles, etc), lawnmowers or power tools. No standing at heights, such as rooftops, ladders or stairs. Avoid hot objects such as stoves, heaters, open fires. Wear a helmet when riding a bicycle, scooter, skateboard, etc. and avoid areas of traffic. Set your water heater to 120 degrees or less.  - regarding stroke prevention, advised on treatment of risk factors; also may consider sleep study in future (due to obesity, HTN, snoring)  Meds ordered this encounter  Medications  . levETIRAcetam (KEPPRA) 500 MG tablet    Sig: Take 1 tablet (500 mg total) by mouth 2 (two) times daily.    Dispense:  180 tablet    Refill:  4   Return in about 6 months (around 04/01/2017).    Penni Bombard, MD [id number], [id number] AM Certified in Neurology, Neurophysiology and Neuroimaging  [facility] [id number]  [address] [place], [place] [address] [phone]

## 2016-10-28 ENCOUNTER — Encounter: Payer: Self-pay | Admitting: Obstetrics & Gynecology

## 2016-10-28 ENCOUNTER — Ambulatory Visit (INDEPENDENT_AMBULATORY_CARE_PROVIDER_SITE_OTHER): Payer: Medicare Other | Admitting: Obstetrics & Gynecology

## 2016-10-28 VITALS — BP 128/62 | HR 64 | Resp 14 | Ht 61.25 in | Wt 223.0 lb

## 2016-10-28 DIAGNOSIS — Z124 Encounter for screening for malignant neoplasm of cervix: Secondary | ICD-10-CM | POA: Diagnosis not present

## 2016-10-28 DIAGNOSIS — Z0000 Encounter for general adult medical examination without abnormal findings: Secondary | ICD-10-CM

## 2016-10-28 DIAGNOSIS — Z01419 Encounter for gynecological examination (general) (routine) without abnormal findings: Secondary | ICD-10-CM | POA: Diagnosis not present

## 2016-10-28 LAB — POCT URINALYSIS DIPSTICK
BILIRUBIN UA: NEGATIVE
Blood, UA: NEGATIVE
GLUCOSE UA: NEGATIVE
KETONES UA: NEGATIVE
Nitrite, UA: NEGATIVE
Protein, UA: NEGATIVE
Urobilinogen, UA: NEGATIVE
pH, UA: 6

## 2016-10-28 NOTE — Progress Notes (Signed)
71 y.o. G2P2 Widowed Caucasian F here for annual exam.  Has new onset of seizures.  Seeing Dr. Leta Baptist.  Was referred by Dr. Drema Dallas.  Had MRI and EEG and these were negative.  On keppra now.  Had two "episodes" that have been classified as a seizure.  Has had not neurological symptoms since starting the keppra.  Not driving right now.  Will start driving again in April.  Looking forward to this.  Denies vaginal bleeding.     Reports having episodes of loose stools that she seems to have mostly at [employer] after drinking tea.  She does have some explosive diarrhea at these times.  She also has normal BMs with formed stool.  Pt thinks if she never ate at [employer], she would still have this twice a week.  This has been going on a year or so.    Patient's last menstrual period was 10/26/1999.          Sexually active: No.  The current method of family planning is tubal ligation.    Exercising: No.  The patient does not participate in regular exercise at present. Smoker:  Former smoker  Health Maintenance: Pap:  06/27/15 negative  History of abnormal Pap:  no MMG:  05/28/16 BIRADS 1 negative  Colonoscopy:  2013 with Dr. Collene Mares- repeat 5 years  BMD:   09/23/10 osteopenia  TDaP:  PCP Pneumonia vaccine(s):  PCP Zostavax:   PCP Hep C testing: per patient negative, done with Dr. Drema Dallas Screening Labs: PCP, Hb today: PCP, Urine today: moderate WBC's   reports that she has quit smoking. She has never used smokeless tobacco. She reports that she drinks alcohol. She reports that she does not use drugs.  Past Medical History:  Diagnosis Date  . 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 Cardiomegaly 2005  . DVT (deep venous thrombosis) ([place]) 02/2004  . Fibroid   . Hypercholesteremia   . Hypertension   . Menometrorrhagia 3/92   menopausal  . Microhematuria 1/96  . Nephrolithiasis 2015  . Periodic heart flutter   . Seizures ([place])    followed by [facility]- Dr. Leta Baptist  . SUI (stress  urinary incontinence, female) 9/91  . Vulvar cyst 7/90    Past Surgical History:  Procedure Laterality Date  . APPENDECTOMY  1950  . BUNIONECTOMY  7/11   second toe  . cataracts  3/09 4/09  . DILATION AND CURETTAGE OF UTERUS     hystreroscopy/imcomplete polypectomy  . KNEE SURGERY  10/13 or 11/13   right knee arthroscopy  . TONSILLECTOMY AND ADENOIDECTOMY  1950  . TUBAL LIGATION  1975   BTL    Current Outpatient Prescriptions  Medication Sig Dispense Refill  . Acetaminophen (TYLENOL 8 HOUR PO) Take by mouth as needed.    . Ascorbic Acid (VITAMIN C PO) Take by mouth as needed.     [name] aspirin 81 MG tablet Take 81 mg by mouth daily.    . Cholecalciferol (VITAMIN D) 2000 UNITS tablet Take 2,000 Units by mouth 2 (two) times daily.     . DiphenhydrAMINE HCl (BENADRYL ALLERGY PO) Take by mouth as needed.    . Ibuprofen (ADVIL PO) Take by mouth as needed.    . Iron Combinations (IRON COMPLEX PO) Take by mouth.    . levETIRAcetam (KEPPRA) 500 MG tablet Take 1 tablet (500 mg total) by mouth 2 (two) times daily. (Patient taking differently: Take 400 mg by mouth 2 (two) times daily. ) 180  tablet 4  . lovastatin (MEVACOR) 40 MG tablet Take 40 mg by mouth at bedtime.     [name] MICARDIS 80 MG tablet Take 80 mg by mouth daily.     . Multiple Vitamin (MULTI-VITAMIN DAILY PO) Take 1 tablet by mouth daily.     . naproxen sodium (ANAPROX) 220 MG tablet Take 220 mg by mouth 2 (two) times daily with a meal. 08/13/16 takes 1-2 daily    . sertraline (ZOLOFT) 100 MG tablet Take 100 mg by mouth daily.    . traZODone (DESYREL) 150 MG tablet 150 mg at bedtime.      No current facility-administered medications for this visit.     Family History  Problem Relation Age of Onset  . Skin cancer Other     both siblings  . Cancer Father     stomach and prostate  . Kidney Stones Father   . Hypertension Mother   . Asthma Mother   . Kidney Stones Mother   . Stroke Maternal Uncle   . Heart attack Maternal Uncle    . Hypertension Maternal Uncle   . Cancer Paternal Uncle   . Stroke Maternal Grandfather     ROS:  Pertinent items are noted in HPI.  Otherwise, a comprehensive ROS was negative.  Exam:   BP 128/62 (BP Location: Right Arm, Patient Position: Sitting, Cuff Size: Normal)   Pulse 64   Resp 14   Ht 5' 1.25" (1.556 m)   Wt 223 lb (101.2 kg)   LMP 10/26/1999   BMI 41.79 kg/m    Height: 5' 1.25" (155.6 cm)  Ht Readings from Last 3 Encounters:  10/28/16 5' 1.25" (1.556 m)  08/13/16 5' 1.5" (1.562 m)  05/25/16 5' 1.5" (1.562 m)    General appearance: alert, cooperative and appears stated age Head: Normocephalic, without obvious abnormality, atraumatic Neck: no adenopathy, supple, symmetrical, trachea midline and thyroid normal to inspection and palpation Lungs: clear to auscultation bilaterally Breasts: normal appearance, no masses or tenderness Heart: regular rate and rhythm Abdomen: soft, non-tender; bowel sounds normal; no masses,  no organomegaly Extremities: extremities normal, atraumatic, no cyanosis or edema Skin: Skin color, texture, turgor normal. No rashes or lesions Lymph nodes: Cervical, supraclavicular, and axillary nodes normal. No abnormal inguinal nodes palpated Neurologic: Grossly normal   Pelvic: External genitalia:  no lesions              Urethra:  normal appearing urethra with no masses, tenderness or lesions              Bartholins and Skenes: normal                 Vagina: normal appearing vagina with normal color and discharge, no lesions              Cervix: no lesions              Pap taken: No. Bimanual Exam:  Uterus:  normal size, contour, position, consistency, mobility, non-tender              Adnexa: normal adnexa and no mass, fullness, tenderness               Rectovaginal: Confirms               Anus:  normal sphincter tone, no lesions  Chaperone was present for exam.  A:     Well Woman with normal exam Elevated  lipids Hypertension Obesity Episodic explosive diarrhea H/o DVT, no estrogen  usage  P: Mammogram up to date. BMD order placed.  Pt will do with her MMG.   Pt forgot to do this last year.   Labs with Dr. Drema Dallas yearly.  Neg pap 2016.  No pap today. She is going to follow up with Dr. Collene Mares.  She will start a probiotic now.   return annually or prn

## 2016-10-28 NOTE — Patient Instructions (Addendum)
When you get your reminder about your mammogram, just schedule a bone density at the same time.  There is already and electronic order that has been placed.  Try a probiotic, like Align, for the next month.

## 2017-02-14 ENCOUNTER — Encounter (HOSPITAL_COMMUNITY): Payer: Self-pay

## 2017-02-14 ENCOUNTER — Inpatient Hospital Stay (HOSPITAL_COMMUNITY)
Admission: EM | Admit: 2017-02-14 | Discharge: 2017-02-17 | DRG: 308 | Disposition: A | Discharge status: Home / Self Care | Payer: Medicare Other | Attending: Cardiology | Admitting: Cardiology

## 2017-02-14 DIAGNOSIS — I509 Heart failure, unspecified: Secondary | ICD-10-CM

## 2017-02-14 DIAGNOSIS — Z87891 Personal history of nicotine dependence: Secondary | ICD-10-CM

## 2017-02-14 DIAGNOSIS — E7800 Pure hypercholesterolemia, unspecified: Secondary | ICD-10-CM | POA: Diagnosis present

## 2017-02-14 DIAGNOSIS — Z6841 Body Mass Index (BMI) 40.0 and over, adult: Secondary | ICD-10-CM

## 2017-02-14 DIAGNOSIS — I110 Hypertensive heart disease with heart failure: Secondary | ICD-10-CM | POA: Diagnosis present

## 2017-02-14 DIAGNOSIS — I340 Nonrheumatic mitral (valve) insufficiency: Secondary | ICD-10-CM | POA: Diagnosis present

## 2017-02-14 DIAGNOSIS — R0789 Other chest pain: Secondary | ICD-10-CM | POA: Diagnosis not present

## 2017-02-14 DIAGNOSIS — I10 Essential (primary) hypertension: Secondary | ICD-10-CM | POA: Diagnosis not present

## 2017-02-14 DIAGNOSIS — G40909 Epilepsy, unspecified, not intractable, without status epilepticus: Secondary | ICD-10-CM

## 2017-02-14 DIAGNOSIS — I5021 Acute systolic (congestive) heart failure: Secondary | ICD-10-CM | POA: Diagnosis not present

## 2017-02-14 DIAGNOSIS — I481 Persistent atrial fibrillation: Secondary | ICD-10-CM | POA: Diagnosis not present

## 2017-02-14 DIAGNOSIS — R51 Headache: Secondary | ICD-10-CM | POA: Diagnosis present

## 2017-02-14 DIAGNOSIS — E785 Hyperlipidemia, unspecified: Secondary | ICD-10-CM | POA: Diagnosis present

## 2017-02-14 DIAGNOSIS — I4891 Unspecified atrial fibrillation: Secondary | ICD-10-CM | POA: Diagnosis not present

## 2017-02-14 DIAGNOSIS — Z7901 Long term (current) use of anticoagulants: Secondary | ICD-10-CM

## 2017-02-14 DIAGNOSIS — Z86718 Personal history of other venous thrombosis and embolism: Secondary | ICD-10-CM

## 2017-02-14 DIAGNOSIS — I429 Cardiomyopathy, unspecified: Secondary | ICD-10-CM | POA: Diagnosis present

## 2017-02-14 DIAGNOSIS — M1990 Unspecified osteoarthritis, unspecified site: Secondary | ICD-10-CM | POA: Diagnosis present

## 2017-02-14 DIAGNOSIS — R569 Unspecified convulsions: Secondary | ICD-10-CM | POA: Diagnosis present

## 2017-02-14 DIAGNOSIS — Z91040 Latex allergy status: Secondary | ICD-10-CM

## 2017-02-14 DIAGNOSIS — Z79899 Other long term (current) drug therapy: Secondary | ICD-10-CM

## 2017-02-14 DIAGNOSIS — R0602 Shortness of breath: Secondary | ICD-10-CM | POA: Diagnosis not present

## 2017-02-14 DIAGNOSIS — I4819 Other persistent atrial fibrillation: Secondary | ICD-10-CM | POA: Diagnosis present

## 2017-02-14 DIAGNOSIS — R635 Abnormal weight gain: Secondary | ICD-10-CM | POA: Diagnosis not present

## 2017-02-14 HISTORY — DX: Other chest pain: R07.89

## 2017-02-14 HISTORY — DX: Paroxysmal atrial fibrillation: I48.0

## 2017-02-14 HISTORY — DX: Unspecified atrial fibrillation: I48.91

## 2017-02-14 HISTORY — DX: Cardiomyopathy, unspecified: I42.9

## 2017-02-14 LAB — BASIC METABOLIC PANEL
ANION GAP: 8 (ref 5–15)
BUN: 15 mg/dL (ref 6–20)
CHLORIDE: 108 mmol/L (ref 101–111)
CO2: 24 mmol/L (ref 22–32)
Calcium: 8.6 mg/dL — ABNORMAL LOW (ref 8.9–10.3)
Creatinine, Ser: 0.86 mg/dL (ref 0.44–1.00)
GFR calc non Af Amer: >60 mL/min (ref >60)
GLUCOSE: 98 mg/dL (ref 65–99)
POTASSIUM: 3.9 mmol/L (ref 3.5–5.1)
Sodium: 140 mmol/L (ref 135–145)

## 2017-02-14 LAB — CBC
HEMATOCRIT: 34.8 % — AB (ref 36.0–46.0)
HEMOGLOBIN: 11.3 g/dL — AB (ref 12.0–15.0)
MCH: 27.2 pg (ref 26.0–34.0)
MCHC: 32.5 g/dL (ref 30.0–36.0)
MCV: 83.7 fL (ref 78.0–100.0)
Platelets: 174 10*3/uL (ref 150–400)
RBC: 4.16 MIL/uL (ref 3.87–5.11)
RDW: 14.2 % (ref 11.5–15.5)
WBC: 6.5 10*3/uL (ref 4.0–10.5)

## 2017-02-14 LAB — I-STAT TROPONIN, ED: TROPONIN I, POC: 0 ng/mL (ref 0.00–0.08)

## 2017-02-14 LAB — PROTIME-INR
INR: 1.15
Prothrombin Time: 14.8 seconds (ref 11.4–15.2)

## 2017-02-14 LAB — MAGNESIUM: Magnesium: 1.9 mg/dL (ref 1.7–2.4)

## 2017-02-14 LAB — TROPONIN I

## 2017-02-14 LAB — BRAIN NATRIURETIC PEPTIDE: B NATRIURETIC PEPTIDE 5: 416.6 pg/mL — AB (ref 0.0–100.0)

## 2017-02-14 MED ORDER — SERTRALINE HCL 100 MG PO TABS
100.0000 mg | ORAL_TABLET | Freq: Every day | ORAL | Status: DC
  Administered 2017-02-15 – 2017-02-17 (×3): 100 mg via ORAL
  Filled 2017-02-14 (×3): qty 1

## 2017-02-14 MED ORDER — DILTIAZEM HCL-DEXTROSE 100-5 MG/100ML-% IV SOLN (PREMIX)
5.0000 mg/h | INTRAVENOUS | Status: DC
  Administered 2017-02-14: 5 mg/h via INTRAVENOUS
  Administered 2017-02-15 (×2): 15 mg/h via INTRAVENOUS
  Filled 2017-02-14 (×3): qty 100

## 2017-02-14 MED ORDER — FUROSEMIDE 10 MG/ML IJ SOLN
40.0000 mg | Freq: Once | INTRAMUSCULAR | Status: AC
  Administered 2017-02-14: 40 mg via INTRAVENOUS
  Filled 2017-02-14: qty 4

## 2017-02-14 MED ORDER — DILTIAZEM HCL-DEXTROSE 100-5 MG/100ML-% IV SOLN (PREMIX): 5.0000 mg/h | INTRAVENOUS | Status: DC

## 2017-02-14 MED ORDER — TELMISARTAN 80 MG PO TABS: 80.0000 mg | ORAL_TABLET | Freq: Every day | ORAL | Status: DC

## 2017-02-14 MED ORDER — DILTIAZEM LOAD VIA INFUSION
20.0000 mg | Freq: Once | INTRAVENOUS | Status: AC
  Administered 2017-02-14: 20 mg via INTRAVENOUS
  Filled 2017-02-14: qty 20

## 2017-02-14 MED ORDER — PRAVASTATIN SODIUM 40 MG PO TABS
80.0000 mg | ORAL_TABLET | Freq: Every day | ORAL | Status: DC
  Administered 2017-02-15 – 2017-02-16 (×2): 80 mg via ORAL
  Filled 2017-02-14 (×2): qty 2

## 2017-02-14 MED ORDER — RIVAROXABAN 20 MG PO TABS: 20.0000 mg | ORAL_TABLET | Freq: Every day | ORAL | Status: DC

## 2017-02-14 MED ORDER — RIVAROXABAN 20 MG PO TABS
20.0000 mg | ORAL_TABLET | Freq: Every day | ORAL | Status: DC
  Administered 2017-02-14 – 2017-02-16 (×3): 20 mg via ORAL
  Filled 2017-02-14 (×3): qty 1

## 2017-02-14 MED ORDER — HEPARIN BOLUS VIA INFUSION
4000.0000 [IU] | Freq: Once | INTRAVENOUS | Status: DC
  Filled 2017-02-14: qty 4000

## 2017-02-14 MED ORDER — ONDANSETRON HCL 4 MG/2ML IJ SOLN: 4.0000 mg | Freq: Four times a day (QID) | INTRAMUSCULAR | Status: DC | PRN

## 2017-02-14 MED ORDER — METOPROLOL TARTRATE 5 MG/5ML IV SOLN
5.0000 mg | Freq: Once | INTRAVENOUS | Status: AC
  Administered 2017-02-14: 5 mg via INTRAVENOUS
  Filled 2017-02-14: qty 5

## 2017-02-14 MED ORDER — TRAZODONE HCL 150 MG PO TABS
150.0000 mg | ORAL_TABLET | Freq: Every day | ORAL | Status: DC
  Administered 2017-02-14 – 2017-02-16 (×3): 150 mg via ORAL
  Filled 2017-02-14 (×3): qty 1

## 2017-02-14 MED ORDER — HEPARIN (PORCINE) IN NACL 100-0.45 UNIT/ML-% IJ SOLN
1100.0000 [IU]/h | INTRAMUSCULAR | Status: DC
Start: 2017-02-14 — End: 2017-02-14
  Filled 2017-02-14: qty 250

## 2017-02-14 MED ORDER — IRBESARTAN 150 MG PO TABS: 75.0000 mg | ORAL_TABLET | Freq: Every day | ORAL | Status: DC

## 2017-02-14 MED ORDER — LEVETIRACETAM 500 MG PO TABS
500.0000 mg | ORAL_TABLET | Freq: Two times a day (BID) | ORAL | Status: DC
  Administered 2017-02-14 – 2017-02-17 (×6): 500 mg via ORAL
  Filled 2017-02-14 (×6): qty 1

## 2017-02-14 MED ORDER — ACETAMINOPHEN 325 MG PO TABS: 650.0000 mg | ORAL_TABLET | ORAL | Status: DC | PRN

## 2017-02-14 NOTE — ED Provider Notes (Signed)
[facility] DEPT Provider Note   CSN: [id number] Arrival date & time: 02/14/17  1724     History   Chief Complaint Chief Complaint  Patient presents with  . Shortness of Breath  . Chest Pain    HPI Ashley Pacheco is a 71 y.o. female.  HPI Patient reports that she has intermittently had some chest pain in the past. He has had it evaluated and it never "amounted to anything". She was having slight chest discomfort and tightness at the end of last week. It was not severe and she did not think much of it. She however started getting shortness of breath as well. She reports she was active this weekend more than usual due to a funeral and doing a lot of running around. She reports however over the weekend she started noticing that she was short of breath simply walking across her kitchen which is atypical for her. She notes also that she was getting swelling in her legs and that she recorded a 20 pound weight gain over 2 week period. She denies any prior history of congestive heart failure. She denies ever being treated with diuretics. Patient denies perceiving any palpitations or feeling syncopal. She denies prior history of atrial fibrillation. Patient made an appointment was seen by her PCP today and found to have atrial fibrillation with rapid ventricular response and chest x-ray consistent with CHF. She was brought to the emergency department via EMS. Past Medical History:  Diagnosis Date  . 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 Cardiomegaly 2005  . DVT (deep venous thrombosis) ([place]) 02/2004  . Fibroid   . Hypercholesteremia   . Hypertension   . Menometrorrhagia 3/92   menopausal  . Microhematuria 1/96  . Nephrolithiasis 2015  . Periodic heart flutter   . Seizures ([place])    followed by [facility]- Dr. Leta Baptist  . SUI (stress urinary incontinence, female) 9/91  . Vulvar cyst 7/90    Patient Active Problem List   Diagnosis Date Noted  . HYPERLIPIDEMIA-MIXED 10/02/2008  .  MORBID OBESITY 10/02/2008  . HYPERTENSION, UNSPECIFIED 10/02/2008  . DVT 10/02/2008    Past Surgical History:  Procedure Laterality Date  . APPENDECTOMY  1950  . BUNIONECTOMY  7/11   second toe  . cataracts  3/09 4/09  . DILATION AND CURETTAGE OF UTERUS     hystreroscopy/imcomplete polypectomy  . KNEE SURGERY  10/13 or 11/13   right knee arthroscopy  . TONSILLECTOMY AND ADENOIDECTOMY  1950  . TUBAL LIGATION  1975   BTL    OB History    Gravida Para Term Preterm AB Living   2 2       2    SAB TAB Ectopic Multiple Live Births                  Obstetric Comments   And 2 stepchildren       Home Medications    Prior to Admission medications   Medication Sig Start Date End Date Taking? Authorizing Provider  Acetaminophen (TYLENOL 8 HOUR PO) Take by mouth as needed.    Historical Provider, MD  Ascorbic Acid (VITAMIN C PO) Take by mouth as needed.     Historical Provider, MD  aspirin 81 MG tablet Take 81 mg by mouth daily.    Historical Provider, MD  Cholecalciferol (VITAMIN D) 2000 UNITS tablet Take 2,000 Units by mouth 2 (two) times daily.     Historical Provider, MD  DiphenhydrAMINE HCl (BENADRYL ALLERGY PO)  Take by mouth as needed.    Historical Provider, MD  Ibuprofen (ADVIL PO) Take by mouth as needed.    Historical Provider, MD  Iron Combinations (IRON COMPLEX PO) Take by mouth.    Historical Provider, MD  levETIRAcetam (KEPPRA) 500 MG tablet Take 1 tablet (500 mg total) by mouth 2 (two) times daily. Patient taking differently: Take 400 mg by mouth 2 (two) times daily.  10/01/16   Penni Bombard, MD  lovastatin (MEVACOR) 40 MG tablet Take 40 mg by mouth at bedtime.  10/04/13   Historical Provider, MD  MICARDIS 80 MG tablet Take 80 mg by mouth daily.  08/06/13   Historical Provider, MD  Multiple Vitamin (MULTI-VITAMIN DAILY PO) Take 1 tablet by mouth daily.     Historical Provider, MD  naproxen sodium (ANAPROX) 220 MG tablet Take 220 mg by mouth 2 (two) times daily  with a meal. 08/13/16 takes 1-2 daily    Historical Provider, MD  sertraline (ZOLOFT) 100 MG tablet Take 100 mg by mouth daily.    Historical Provider, MD  traZODone (DESYREL) 150 MG tablet 150 mg at bedtime.  08/27/13   Historical Provider, MD    Family History Family History  Problem Relation Age of Onset  . Skin cancer Other     both siblings  . Cancer Father     stomach and prostate  . Kidney Stones Father   . Hypertension Mother   . Asthma Mother   . Kidney Stones Mother   . Stroke Maternal Uncle   . Heart attack Maternal Uncle   . Hypertension Maternal Uncle   . Cancer Paternal Uncle   . Stroke Maternal Grandfather     Social History Social History  Substance Use Topics  . Smoking status: Former Research scientist (life sciences)  . Smokeless tobacco: Never Used     Comment: quit in 1969  . Alcohol use 0.0 oz/week     Comment: periodically     Allergies   Latex   Review of Systems Review of Systems 10 Systems reviewed and are negative for acute change except as noted in the HPI.   Physical Exam Updated Vital Signs BP (!) 139/94   Pulse (!) 135   Temp 98.3 F (36.8 C) (Oral)   Resp 16   Ht 5\' 1"  (1.549 m)   Wt 235 lb (106.6 kg)   LMP 10/26/1999   SpO2 94%   BMI 44.40 kg/m   Physical Exam  Constitutional: She is oriented to person, place, and time.  Patient is alert and nontoxic. Mild increased work of breathing at rest.  HENT:  Head: Normocephalic and atraumatic.  Nose: Nose normal.  Mouth/Throat: Oropharynx is clear and moist.  Eyes: Conjunctivae are normal.  Neck: Neck supple.  Cardiovascular:  Tachycardia, irregularly irregular. Pulses intact.  Pulmonary/Chest:  Mild increased work of breathing. Diffuse Rales throughout.  Abdominal: Soft. She exhibits no distension. There is no tenderness. There is no guarding.  Musculoskeletal:  2+ edema bilateral lower extremities. Calves nontender.  Neurological: She is alert and oriented to person, place, and time. No cranial  nerve deficit. She exhibits normal muscle tone. Coordination normal.  Skin: Skin is warm and dry.  Psychiatric: She has a normal mood and affect.     ED Treatments / Results  Labs (all labs ordered are listed, but only abnormal results are displayed) Labs Reviewed  BASIC METABOLIC PANEL - Abnormal; Notable for the following:       Result Value   Calcium 8.6 (*)  All other components within normal limits  CBC - Abnormal; Notable for the following:    Hemoglobin 11.3 (*)    HCT 34.8 (*)    All other components within normal limits  BRAIN NATRIURETIC PEPTIDE  PROTIME-INR  I-STAT TROPOININ, ED    EKG  EKG Interpretation None       Radiology No results found.  Procedures Procedures (including critical care time) CRITICAL CARE Performed by: Charlesetta Shanks   Total critical care time: 30 minutes  Critical care time was exclusive of separately billable procedures and treating other patients.  Critical care was necessary to treat or prevent imminent or life-threatening deterioration.  Critical care was time spent personally by me on the following activities: development of treatment plan with patient and/or surrogate as well as nursing, discussions with consultants, evaluation of patient's response to treatment, examination of patient, obtaining history from patient or surrogate, ordering and performing treatments and interventions, ordering and review of laboratory studies, ordering and review of radiographic studies, pulse oximetry and re-evaluation of patient's condition.   Medications Ordered in ED Medications  metoprolol (LOPRESSOR) injection 5 mg (5 mg Intravenous Given 02/14/17 1818)  furosemide (LASIX) injection 40 mg (40 mg Intravenous Given 02/14/17 1818)     Initial Impression / Assessment and Plan / ED Course  I have reviewed the triage vital signs and the nursing notes.  Pertinent labs & imaging results that were available during my care of the patient  were reviewed by me and considered in my medical decision making (see chart for details).    Consult: (18:20) reviewed with Dr. Percival Spanish patient will be seen in the emergency department  Final Clinical Impressions(s) / ED Diagnoses   Final diagnoses:  Atrial fibrillation with rapid ventricular response ([place])  Acute congestive heart failure, unspecified heart failure type [facility])   Patient presents with new onset atrial fibrillation rapid ventricular response. Patient is not aware of time of onset. She is asymptomatic in terms of perceiving palpitations. Patient does have dyspnea and peripheral edema. Vascular overload is present on chest x-ray and Rales are present. Findings consistent with associated congestive heart failure. These are both new onset according to the patient. Heparin, Lopressor and Cardizem load and drip initiated in the emergency department. Patient had 4 baby aspirin at the outpatient office prior to arrival. New Prescriptions New Prescriptions   No medications on file     Charlesetta Shanks, MD 02/14/17 1836

## 2017-02-14 NOTE — H&P (Signed)
CARDIOLOGY ADMISSION NOTE  Patient ID: Ashley Pacheco MRN: [id number] DOB/AGE: [birth year] 72 y.o.  Admit date: 02/14/2017 Primary Physician   Gerrit Heck, MD Primary Cardiologist   Dr. Marlou Porch Chief Complaint    Dyspnea and chest pressure  HPI:  The patient has previously seen Dr. Marlou Porch for evaluation of atypical chest pain.  She had an echo in 2015 when she was last seen.  This was to evaluate a slight heart murmur.  She had NL LV function.   She had aortic valve calcification but no significant AS.  Perfusion study did not suggest ischemia.    She presents today with dyspnea and some chest presure.  She was seen by her PCP and was noted to be in atrial fib and was sent to the ED.  In the ED her initial troponin was negative.  EKG with atrial fib rate 131 with no acute ST T wave changes.  She has been given IV Lasix.  She reports that she has had an unexplained 20 lb weight gain in 2 weeks.  She has had increased SOB.  She has chronic SOB with her weight and gets SOB walking up stairs.  However, now she is SOB for the last couple of weeks walking across the room.  She has had no PND or orthopnea except for getting SOB one night when she got up to go to the bathroom.  She has had a fullness or pressure in her chest that was constant but she has not felt palpitations. She has had no presyncope or syncope.   Past Medical History:  Diagnosis Date  . 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 Cardiomegaly 2005  . DVT (deep venous thrombosis) ([place]) 02/2004  . Fibroid   . Hypercholesteremia   . Hypertension   . Menometrorrhagia 3/92   menopausal  . Microhematuria 1/96  . Nephrolithiasis 2015  . Periodic heart flutter   . Seizures ([place])    followed by [facility]- Dr. Leta Baptist  . SUI (stress urinary incontinence, female) 9/91  . Vulvar cyst 7/90    Past Surgical History:  Procedure Laterality Date  . APPENDECTOMY  1950  . BUNIONECTOMY  7/11   second toe  . cataracts  3/09 4/09   . DILATION AND CURETTAGE OF UTERUS     hystreroscopy/imcomplete polypectomy  . KNEE SURGERY  10/13 or 11/13   right knee arthroscopy  . TONSILLECTOMY AND ADENOIDECTOMY  1950  . TUBAL LIGATION  1975   BTL    Allergies  Allergen Reactions  . Latex     bandaids cause rash   No current facility-administered medications on file prior to encounter.    Current Outpatient Prescriptions on File Prior to Encounter  Medication Sig Dispense Refill  . Acetaminophen (TYLENOL 8 HOUR PO) Take by mouth as needed.    . Ascorbic Acid (VITAMIN C PO) Take by mouth as needed.     [name] aspirin 81 MG tablet Take 81 mg by mouth daily.    . Cholecalciferol (VITAMIN D) 2000 UNITS tablet Take 2,000 Units by mouth 2 (two) times daily.     . DiphenhydrAMINE HCl (BENADRYL ALLERGY PO) Take by mouth as needed.    . Ibuprofen (ADVIL PO) Take by mouth as needed.    . Iron Combinations (IRON COMPLEX PO) Take by mouth.    . levETIRAcetam (KEPPRA) 500 MG tablet Take 1 tablet (500 mg total) by mouth 2 (two) times daily. (Patient taking differently: Take 400 mg by mouth  2 (two) times daily. ) 180 tablet 4  . lovastatin (MEVACOR) 40 MG tablet Take 40 mg by mouth at bedtime.     [name] MICARDIS 80 MG tablet Take 80 mg by mouth daily.     . Multiple Vitamin (MULTI-VITAMIN DAILY PO) Take 1 tablet by mouth daily.     . naproxen sodium (ANAPROX) 220 MG tablet Take 220 mg by mouth 2 (two) times daily with a meal. 08/13/16 takes 1-2 daily    . sertraline (ZOLOFT) 100 MG tablet Take 100 mg by mouth daily.    . traZODone (DESYREL) 150 MG tablet 150 mg at bedtime.      Social History   Social History  . Marital status: Widowed    Spouse name: N/A  . Number of children: 2  . Years of education: 16   Occupational History  .      volunteer   Social History Main Topics  . Smoking status: Former Smoker    Types: Cigarettes  . Smokeless tobacco: Never Used     Comment: quit in 1969  . Alcohol use 0.0 oz/week     Comment:  periodically  . Drug use: No  . Sexual activity: No   Other Topics Concern  . Not on file   Social History Narrative   Lives alone   Caffeine- coffee- 3 mugs daily    Family History  Problem Relation Age of Onset  . Skin cancer Other     both siblings  . Cancer Father     stomach and prostate  . Kidney Stones Father   . Hypertension Mother   . Asthma Mother   . Kidney Stones Mother   . Stroke Maternal Uncle   . Heart attack Maternal Uncle   . Hypertension Maternal Uncle   . Cancer Paternal Uncle   . Stroke Maternal Grandfather   . Heart disease Sister     Rapid rhythm  . Heart disease Brother     Rapid rhythm     ROS:  As stated in the HPI and negative for all other systems.  Physical Exam: Blood pressure 126/86, pulse (!) 113, temperature 98.3 F (36.8 C), temperature source Oral, resp. rate (!) 22, height 5\' 1"  (1.549 m), weight 235 lb (106.6 kg), last menstrual period 10/26/1999, SpO2 97 %.  GENERAL:  Well appearing HEENT:  Pupils equal round and reactive, fundi not visualized, oral mucosa unremarkable NECK:  No jugular venous distention, waveform within normal limits, carotid upstroke brisk and symmetric, no bruits, no thyromegaly LYMPHATICS:  No cervical, inguinal adenopathy LUNGS:  Clear to auscultation bilaterally BACK:  No CVA tenderness CHEST:  Unremarkable HEART:  PMI not displaced or sustained,S1 and S2 within normal limits, no S3,  no clicks, no rubs, no murmurs, irregular ABD:  Flat, positive bowel sounds normal in frequency in pitch, no bruits, no rebound, no guarding, no midline pulsatile mass, no hepatomegaly, no splenomegaly EXT:  2 plus pulses throughout, mild non pitting lower extremity edema, no cyanosis no clubbing SKIN:  No rashes no nodules NEURO:  Cranial nerves II through XII grossly intact, motor grossly intact throughout PSYCH:  Cognitively intact, oriented to person place and time  Labs: Lab Results  Component Value Date   BUN 15  02/14/2017   Lab Results  Component Value Date   CREATININE 0.86 02/14/2017   Lab Results  Component Value Date   NA 140 02/14/2017   K 3.9 02/14/2017   CL 108 02/14/2017   CO2 24  02/14/2017   No results found for: TROPONINI Lab Results  Component Value Date   WBC 6.5 02/14/2017   HGB 11.3 (L) 02/14/2017   HCT 34.8 (L) 02/14/2017   MCV 83.7 02/14/2017   PLT 174 02/14/2017   No results found for: CHOL, HDL, LDLCALC, LDLDIRECT, TRIG, CHOLHDL No results found for: ALT, AST, GGT, ALKPHOS, BILITOT    Radiology:  CXR:  Pending  EKG:  Atrial fib with rate 131, no acute ST T wave changes.  02/14/2017  ASSESSMENT AND PLAN:    ATRIAL FIB:  The duration is not clear.  She has rapid rate.  Ms. EVANGELA HEFFLER has a CHA2DS2 - VASc score of 3.  I will start Xarelto and IV dilt for rate control.  Other work up as below.    DYSPNEA:  I suspect that she has some acute diastolic HF.  I will give Lasix 40 mg IV daily for now and check an echo.  I will cycle enzymes.   OBESITY:  She will be educated.   HTN:  I will continue meds as above and IV Cardizem tonight.  This can be changed to PO in the AM possibly   HYPERLIPIDEMIA:    Can be followed as an out patient.  SEIZURE DISORDER. :  Continue Keppra.   SignedMinus Breeding 02/14/2017, 6:59 PM

## 2017-02-14 NOTE — ED Notes (Signed)
Attempted report x1. 

## 2017-02-14 NOTE — ED Notes (Signed)
RN spoke with admitting MD. Bed request to be changed to Stepdown due to cardizem drip.

## 2017-02-14 NOTE — ED Triage Notes (Signed)
Pt brought in by EMS from doctor office for SOB and chest pain. Pt has gained 15-20lbs in the last 2 weeks and SOB and chest pain has gotten worse since Friday. Pt a&ox4. Pt in a.fib RVR, HR 130-160's.

## 2017-02-14 NOTE — ED Notes (Signed)
Cardiologist at bedside.  

## 2017-02-14 NOTE — Progress Notes (Signed)
ANTICOAGULATION CONSULT NOTE - Initial Consult  Pharmacy Consult for heparin Indication: atrial fibrillation  Allergies  Allergen Reactions  . Latex     bandaids cause rash    Patient Measurements: Height: 5\' 1"  (154.9 cm) Weight: 235 lb (106.6 kg) IBW/kg (Calculated) : 47.8 Heparin Dosing Weight: 73.8  Vital Signs: Temp: 98.3 F (36.8 C) (04/23 1739) Temp Source: Oral (04/23 1739) BP: 140/99 (04/23 1815) Pulse Rate: 45 (04/23 1815)  Labs:  Recent Labs  02/14/17 1755  HGB 11.3*  HCT 34.8*  PLT 174  CREATININE 0.86    Estimated Creatinine Clearance: 67.5 mL/min (by C-G formula based on SCr of 0.86 mg/dL).   Medical History: Past Medical History:  Diagnosis Date  . 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 Cardiomegaly 2005  . DVT (deep venous thrombosis) ([place]) 02/2004  . Fibroid   . Hypercholesteremia   . Hypertension   . Menometrorrhagia 3/92   menopausal  . Microhematuria 1/96  . Nephrolithiasis 2015  . Periodic heart flutter   . Seizures ([place])    followed by [facility]- Dr. Leta Baptist  . SUI (stress urinary incontinence, female) 9/91  . Vulvar cyst 7/90    Assessment: 71 yo female with afib. Pharmacy consulted for heparin. Not on anticoagulation PTA.  Goal of Therapy:  Heparin level 0.3-0.7 units/ml Monitor platelets by anticoagulation protocol: Yes   Plan:  Give 4000 units bolus x 1 Start heparin infusion at 1100 units/hr Check anti-Xa level in 8 hours and daily while on heparin Continue to monitor H&H and platelets  Melburn Popper 02/14/2017,6:41 PM

## 2017-02-14 NOTE — ED Notes (Signed)
MD informed F2F that pt has new onset of symptoms. Pt c/o L sided facial pain that feels like a headache that goes into her L arm and shoulder. Pt states it hurts to swallow. Pt states the pain in her L shoulder and arm comes in waves. Pt able to left extrimity.  MD states complete neuro assessment was completed and discovered that pt has a pulled muscle.

## 2017-02-15 DIAGNOSIS — G40909 Epilepsy, unspecified, not intractable, without status epilepticus: Secondary | ICD-10-CM | POA: Diagnosis not present

## 2017-02-15 DIAGNOSIS — I351 Nonrheumatic aortic (valve) insufficiency: Secondary | ICD-10-CM | POA: Diagnosis not present

## 2017-02-15 DIAGNOSIS — R569 Unspecified convulsions: Secondary | ICD-10-CM | POA: Diagnosis present

## 2017-02-15 DIAGNOSIS — Z79899 Other long term (current) drug therapy: Secondary | ICD-10-CM | POA: Diagnosis not present

## 2017-02-15 DIAGNOSIS — I4891 Unspecified atrial fibrillation: Secondary | ICD-10-CM | POA: Diagnosis not present

## 2017-02-15 DIAGNOSIS — R0602 Shortness of breath: Secondary | ICD-10-CM | POA: Diagnosis not present

## 2017-02-15 DIAGNOSIS — E782 Mixed hyperlipidemia: Secondary | ICD-10-CM | POA: Diagnosis not present

## 2017-02-15 DIAGNOSIS — Z91040 Latex allergy status: Secondary | ICD-10-CM | POA: Diagnosis not present

## 2017-02-15 DIAGNOSIS — I340 Nonrheumatic mitral (valve) insufficiency: Secondary | ICD-10-CM | POA: Diagnosis not present

## 2017-02-15 DIAGNOSIS — Z87891 Personal history of nicotine dependence: Secondary | ICD-10-CM | POA: Diagnosis not present

## 2017-02-15 DIAGNOSIS — I429 Cardiomyopathy, unspecified: Secondary | ICD-10-CM | POA: Diagnosis not present

## 2017-02-15 DIAGNOSIS — M1990 Unspecified osteoarthritis, unspecified site: Secondary | ICD-10-CM | POA: Diagnosis present

## 2017-02-15 DIAGNOSIS — I82409 Acute embolism and thrombosis of unspecified deep veins of unspecified lower extremity: Secondary | ICD-10-CM | POA: Diagnosis not present

## 2017-02-15 DIAGNOSIS — I5021 Acute systolic (congestive) heart failure: Secondary | ICD-10-CM | POA: Diagnosis not present

## 2017-02-15 DIAGNOSIS — Z6841 Body Mass Index (BMI) 40.0 and over, adult: Secondary | ICD-10-CM | POA: Diagnosis not present

## 2017-02-15 DIAGNOSIS — I110 Hypertensive heart disease with heart failure: Secondary | ICD-10-CM | POA: Diagnosis not present

## 2017-02-15 DIAGNOSIS — I10 Essential (primary) hypertension: Secondary | ICD-10-CM | POA: Diagnosis not present

## 2017-02-15 DIAGNOSIS — Z86718 Personal history of other venous thrombosis and embolism: Secondary | ICD-10-CM | POA: Diagnosis not present

## 2017-02-15 DIAGNOSIS — R51 Headache: Secondary | ICD-10-CM | POA: Diagnosis present

## 2017-02-15 DIAGNOSIS — E785 Hyperlipidemia, unspecified: Secondary | ICD-10-CM | POA: Diagnosis not present

## 2017-02-15 DIAGNOSIS — E7800 Pure hypercholesterolemia, unspecified: Secondary | ICD-10-CM | POA: Diagnosis present

## 2017-02-15 DIAGNOSIS — Z7901 Long term (current) use of anticoagulants: Secondary | ICD-10-CM | POA: Diagnosis not present

## 2017-02-15 LAB — CBC
HCT: 35.7 % — ABNORMAL LOW (ref 36.0–46.0)
HEMOGLOBIN: 11.4 g/dL — AB (ref 12.0–15.0)
MCH: 26.8 pg (ref 26.0–34.0)
MCHC: 31.9 g/dL (ref 30.0–36.0)
MCV: 84 fL (ref 78.0–100.0)
PLATELETS: 172 10*3/uL (ref 150–400)
RBC: 4.25 MIL/uL (ref 3.87–5.11)
RDW: 14.2 % (ref 11.5–15.5)
WBC: 6.3 10*3/uL (ref 4.0–10.5)

## 2017-02-15 LAB — MRSA PCR SCREENING: MRSA BY PCR: NEGATIVE

## 2017-02-15 LAB — BASIC METABOLIC PANEL
ANION GAP: 9 (ref 5–15)
BUN: 11 mg/dL (ref 6–20)
CHLORIDE: 106 mmol/L (ref 101–111)
CO2: 29 mmol/L (ref 22–32)
Calcium: 8.4 mg/dL — ABNORMAL LOW (ref 8.9–10.3)
Creatinine, Ser: 0.77 mg/dL (ref 0.44–1.00)
GFR calc Af Amer: >60 mL/min (ref >60)
GFR calc non Af Amer: >60 mL/min (ref >60)
GLUCOSE: 97 mg/dL (ref 65–99)
Potassium: 3.5 mmol/L (ref 3.5–5.1)
Sodium: 144 mmol/L (ref 135–145)

## 2017-02-15 LAB — TROPONIN I
Troponin I: <0.03 ng/mL (ref <0.03)
Troponin I: <0.03 ng/mL (ref <0.03)

## 2017-02-15 LAB — TSH: TSH: 4.332 u[IU]/mL (ref 0.350–4.500)

## 2017-02-15 LAB — T4, FREE: Free T4: 1.02 ng/dL (ref 0.61–1.12)

## 2017-02-15 MED ORDER — TELMISARTAN 80 MG PO TABS
40.0000 mg | ORAL_TABLET | Freq: Every day | ORAL | Status: DC
  Administered 2017-02-16: 40 mg via ORAL
  Filled 2017-02-15 (×2): qty 1

## 2017-02-15 MED ORDER — DILTIAZEM HCL ER COATED BEADS 360 MG PO CP24
360.0000 mg | ORAL_CAPSULE | Freq: Once | ORAL | Status: AC
Start: 2017-02-15 — End: 2017-02-15
  Administered 2017-02-15: 360 mg via ORAL
  Filled 2017-02-15: qty 1

## 2017-02-15 MED ORDER — POTASSIUM CHLORIDE CRYS ER 20 MEQ PO TBCR
20.0000 meq | EXTENDED_RELEASE_TABLET | Freq: Once | ORAL | Status: AC
  Administered 2017-02-15: 20 meq via ORAL
  Filled 2017-02-15: qty 1

## 2017-02-15 MED ORDER — FUROSEMIDE 40 MG PO TABS
40.0000 mg | ORAL_TABLET | Freq: Every day | ORAL | Status: DC
  Administered 2017-02-15 – 2017-02-17 (×3): 40 mg via ORAL
  Filled 2017-02-15 (×3): qty 1

## 2017-02-15 MED ORDER — DILTIAZEM HCL ER COATED BEADS 360 MG PO CP24: 360.0000 mg | ORAL_CAPSULE | Freq: Every day | ORAL | Status: DC

## 2017-02-15 MED ORDER — DILTIAZEM HCL ER COATED BEADS 240 MG PO CP24
240.0000 mg | ORAL_CAPSULE | Freq: Every day | ORAL | Status: DC
  Administered 2017-02-16 – 2017-02-17 (×2): 240 mg via ORAL
  Filled 2017-02-15 (×3): qty 1

## 2017-02-15 NOTE — Progress Notes (Signed)
Progress Note  Patient Name: Ashley Pacheco Date of Encounter: 02/15/2017  Primary Cardiologist: Dr. Marlou Porch  Subjective   The patient is feeling better. Her breathing is much better and swelling is better. She is having soreness in the upper left chest just under the clavicle that goes through to the back and worse with movement. She also has a left sided headache with no other neurologic effects.   Inpatient Medications    Scheduled Meds: . levETIRAcetam  500 mg Oral BID  . pravastatin  80 mg Oral q1800  . rivaroxaban  20 mg Oral Q supper  . sertraline  100 mg Oral Daily  . telmisartan  80 mg Oral Daily  . traZODone  150 mg Oral QHS   Continuous Infusions: . diltiazem (CARDIZEM) infusion 15 mg/hr (02/15/17 0800)   PRN Meds: acetaminophen, ondansetron (ZOFRAN) IV   Vital Signs    Vitals:   02/15/17 0600 02/15/17 0630 02/15/17 0700 02/15/17 0818  BP: 103/76 102/85 (!) 121/91 112/67  Pulse: 86 68 94 (!) 180  Resp: 19 18 18  (!) 22  Temp:    97.9 F (36.6 C)  TempSrc:    Oral  SpO2: 93% 91% 92% 96%  Weight:      Height:        Intake/Output Summary (Last 24 hours) at 02/15/17 1028 Last data filed at 02/15/17 0800  Gross per 24 hour  Intake           177.84 ml  Output             2200 ml  Net         -2022.16 ml   Filed Weights   02/14/17 1739 02/15/17 0400  Weight: 235 lb (106.6 kg) 235 lb (106.6 kg)    Telemetry    Atrial fibrillation with rates from 90's-120's. - Personally Reviewed  ECG    No new tracings  Physical Exam   GEN: No acute distress.   Neck: No JVD Cardiac:  irregularly irregular rhythm, no murmurs, rubs, or gallops.  Respiratory: Clear to auscultation bilaterally. GI: Soft, nontender, non-distended  MS: Trace ankle edema; No deformity. Neuro:  Nonfocal  Psych: Normal affect   Labs    Chemistry Recent Labs Lab 02/14/17 1755 02/15/17 0356  NA 140 144  K 3.9 3.5  CL 108 106  CO2 24 29  GLUCOSE 98 97  BUN 15 11    CREATININE 0.86 0.77  CALCIUM 8.6* 8.4*  GFRNONAA >60 >60  GFRAA >60 >60  ANIONGAP 8 9     Hematology Recent Labs Lab 02/14/17 1755 02/15/17 0356  WBC 6.5 6.3  RBC 4.16 4.25  HGB 11.3* 11.4*  HCT 34.8* 35.7*  MCV 83.7 84.0  MCH 27.2 26.8  MCHC 32.5 31.9  RDW 14.2 14.2  PLT 174 172    Cardiac Enzymes Recent Labs Lab 02/14/17 2251 02/15/17 0356  TROPONINI <0.03 <0.03    Recent Labs Lab 02/14/17 1812  TROPIPOC 0.00     BNP Recent Labs Lab 02/14/17 1755  BNP 416.6*     DDimer No results for input(s): DDIMER in the last 168 hours.   Radiology    No results found.  Cardiac Studies   Echo pending  Patient Profile     71 y.o. female with past medical history of HTN, hyperlipidemia, DVT (2005), cardiomegaly and arthritis. She presented to her PCP on 02/14/17 for evaluation of shortness of breath when walking across the room, increased edema and 20 pound  weight gain over 2 weeks. She was found to be in atrial fibrillation with RVR and was directed to the ED. She has no known history of afib or heart failure. She was placed on cardizem drip.   Assessment & Plan    Atrial fibrillation with RVR -No known history of atrial fib. Unknown duration of this episode.  -Thyroid function normal. -CHA2DS2/VAS Stroke Risk Score is at least 3 (HTN, Age, female)  Xarelto has been started for stroke risk reduction.  -Currently on diltiazem drip at 15 mg/hr with heart rates in the 80's-110's. Blood pressure has been stable 94-117/70's. Change cardizem to oral dosing with cardizem 240 mg daily with 2 hour overlap. Can consider adding low dose BB if better rate control needed and BP allows.  -Will continue rate control. Consider elective cardioversion after 3 weeks of anticoagulation if still in afib.    Dyspnea -BNP 416.6 -Suspected acute diastolic heart failure. Lasix 40 mg IV X 1 given. I&O net negative 2L. Weight unchanged. Breathing is much better and edema is improved per  pt.  -Troponins have been negative.  -Echo is pending.  HTN -Home med- Micardis 80 mg. Will decrease dose to allow room in blood pressure for rate controlling meds.   HLD -On lovastatin 40 mg -Followed by PCP  Seizure disorder -Continue Keppra  Signed, Daune Perch, NP  02/15/2017, 10:28 AM    History and all data above reviewed.  Patient examined.  I agree with the findings as above. Breathing better but not at baseline.  No pain. She does have a headache.   The patient exam reveals EGB:[id number]  ,  Lungs: Clear  ,  Abd: Positive bowel sounds, no rebound no guarding, Ext Trace edema  .  All available labs, radiology testing, previous records reviewed. Agree with documented assessment and plan. Change to PO Cardizem.  Echo today.  Likely home in the AM.   Minus Breeding  11:35 AM  02/15/2017

## 2017-02-15 NOTE — Progress Notes (Signed)
Patient requested her son bring home medication Micardis 80mg . Explained to patient that her physician does not have an order for that home medication at this time. Patient states understanding and states "ill need it just incase." explained to patient that I will inventory medication with pharmacy and have it stored there. Patient verbalizes understanding. Micardis 80mg  X3 inventoried in pharmacy for medication storage.

## 2017-02-15 NOTE — Progress Notes (Signed)
ANTICOAGULATION CONSULT NOTE - Follow Up Consult  Pharmacy Consult for heparin > rivaroxaban Indication: atrial fibrillation  Allergies  Allergen Reactions  . Latex     bandaids cause rash    Patient Measurements: Height: 5\' 1"  (154.9 cm) Weight: 235 lb (106.6 kg) IBW/kg (Calculated) : 47.8 Heparin Dosing Weight: 73.8  Vital Signs: Temp: 98.1 F (36.7 C) (04/24 1231) Temp Source: Oral (04/24 1231) BP: 112/72 (04/24 1231) Pulse Rate: 61 (04/24 1231)  Labs:  Recent Labs  02/14/17 1755 02/14/17 2251 02/15/17 0356 02/15/17 1111  HGB 11.3*  --  11.4*  --   HCT 34.8*  --  35.7*  --   PLT 174  --  172  --   LABPROT 14.8  --   --   --   INR 1.15  --   --   --   CREATININE 0.86  --  0.77  --   TROPONINI  --  <0.03 <0.03 <0.03    Estimated Creatinine Clearance: 72.6 mL/min (by C-G formula based on SCr of 0.77 mg/dL).   Medical History: Past Medical History:  Diagnosis Date  . 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 Cardiomegaly 2005  . DVT (deep venous thrombosis) ([place]) 02/2004  . Fibroid   . Hypercholesteremia   . Hypertension   . Menometrorrhagia 3/92   menopausal  . Microhematuria 1/96  . Nephrolithiasis 2015  . Periodic heart flutter   . Seizures ([place])    followed by [facility]- Dr. Leta Baptist  . SUI (stress urinary incontinence, female) 9/91  . Vulvar cyst 7/90    Assessment: 71 yo female with afib. Pharmacy consulted for heparin. Not on anticoagulation PTA. Heparin drip change to po rivaroxaban 20mg  daily dose ok fro renal fx and wt.  CBC stable  Of note K fell 3.9>3.5 after furosemide IV> will replace KCL 57meq x1 check AML  Goal of Therapy:  Heparin level 0.3-0.7 units/ml Monitor platelets by anticoagulation protocol: Yes   Plan:  rivaroxaban 20mg  daily KCL 20 meq x1  Bonnita Nasuti Pharm.D. CPP, BCPS Clinical Pharmacist [phone] 02/15/2017 2:19 PM

## 2017-02-15 NOTE — Care Management Obs Status (Signed)
[place] NOTIFICATION   Patient Details  Name: Ashley Pacheco MRN: [id number] Date of Birth: 08-20-1946   Medicare Observation Status Notification Given:  Yes    Carles Collet, RN 02/15/2017, 12:06 PM

## 2017-02-16 ENCOUNTER — Inpatient Hospital Stay (HOSPITAL_COMMUNITY): Payer: Medicare Other

## 2017-02-16 DIAGNOSIS — I4891 Unspecified atrial fibrillation: Secondary | ICD-10-CM

## 2017-02-16 DIAGNOSIS — I5021 Acute systolic (congestive) heart failure: Secondary | ICD-10-CM

## 2017-02-16 LAB — BASIC METABOLIC PANEL
Anion gap: 7 (ref 5–15)
BUN: 12 mg/dL (ref 6–20)
CALCIUM: 8.4 mg/dL — AB (ref 8.9–10.3)
CHLORIDE: 108 mmol/L (ref 101–111)
CO2: 28 mmol/L (ref 22–32)
CREATININE: 0.78 mg/dL (ref 0.44–1.00)
GFR calc non Af Amer: >60 mL/min (ref >60)
Glucose, Bld: 106 mg/dL — ABNORMAL HIGH (ref 65–99)
Potassium: 3.6 mmol/L (ref 3.5–5.1)
Sodium: 143 mmol/L (ref 135–145)

## 2017-02-16 LAB — ECHOCARDIOGRAM COMPLETE
HEIGHTINCHES: 61 in
WEIGHTICAEL: 3760 [oz_av]

## 2017-02-16 MED ORDER — SODIUM CHLORIDE 0.9% FLUSH: 3.0000 mL | INTRAVENOUS | Status: DC | PRN

## 2017-02-16 MED ORDER — SODIUM CHLORIDE 0.9% FLUSH
3.0000 mL | Freq: Two times a day (BID) | INTRAVENOUS | Status: DC
  Administered 2017-02-16 – 2017-02-17 (×2): 3 mL via INTRAVENOUS

## 2017-02-16 MED ORDER — SODIUM CHLORIDE 0.9 % IV SOLN: 250.0000 mL | INTRAVENOUS | Status: DC

## 2017-02-16 MED ORDER — METOPROLOL TARTRATE 12.5 MG HALF TABLET
12.5000 mg | ORAL_TABLET | Freq: Three times a day (TID) | ORAL | Status: DC
  Administered 2017-02-16 – 2017-02-17 (×5): 12.5 mg via ORAL
  Filled 2017-02-16 (×5): qty 1

## 2017-02-16 NOTE — Progress Notes (Addendum)
Progress Note  Patient Name: Ashley Pacheco Date of Encounter: 02/16/2017  Primary Cardiologist:   Dr. Marlou Porch  Subjective   The patient is breathing better.  Still with rapid heart rate.    Inpatient Medications    Scheduled Meds: . diltiazem  240 mg Oral Daily  . furosemide  40 mg Oral Daily  . levETIRAcetam  500 mg Oral BID  . pravastatin  80 mg Oral q1800  . rivaroxaban  20 mg Oral Q supper  . sertraline  100 mg Oral Daily  . telmisartan  40 mg Oral Daily  . traZODone  150 mg Oral QHS   Continuous Infusions:  PRN Meds: acetaminophen, ondansetron (ZOFRAN) IV   Vital Signs    Vitals:   02/16/17 0100 02/16/17 0400 02/16/17 0800 02/16/17 0829  BP: 114/88  (!) 118/102 (!) 95/55  Pulse: (!) 113 (!) 39 67 (!) 52  Resp: (!) 22 (!) 23 (!) 23 19  Temp:  98.4 F (36.9 C)  98.5 F (36.9 C)  TempSrc:  Oral  Oral  SpO2: (!) 88% 90% 94% 94%  Weight:      Height:        Intake/Output Summary (Last 24 hours) at 02/16/17 1140 Last data filed at 02/16/17 0800  Gross per 24 hour  Intake              315 ml  Output             1825 ml  Net            -1510 ml   Filed Weights   02/14/17 1739 02/15/17 0400  Weight: 235 lb (106.6 kg) 235 lb (106.6 kg)    Telemetry    Atrial fib with RVR - Personally Reviewed  ECG    NA - Personally Reviewed  Physical Exam   GEN: No acute distress.  No distress Neck: No  JVD Cardiac: IrrgeularRR, no murmurs, rubs, or gallops.  Respiratory: Clear  to auscultation bilaterally. GI: Soft, nontender, non-distended  MS: No  edema; No deformity. Neuro:  Nonfocal  Psych: Normal affect   Labs    Chemistry Recent Labs Lab 02/14/17 1755 02/15/17 0356 02/16/17 0304  NA 140 144 143  K 3.9 3.5 3.6  CL 108 106 108  CO2 24 29 28   GLUCOSE 98 97 106*  BUN 15 11 12   CREATININE 0.86 0.77 0.78  CALCIUM 8.6* 8.4* 8.4*  GFRNONAA >60 >60 >60  GFRAA >60 >60 >60  ANIONGAP 8 9 7      Hematology Recent Labs Lab 02/14/17 1755  02/15/17 0356  WBC 6.5 6.3  RBC 4.16 4.25  HGB 11.3* 11.4*  HCT 34.8* 35.7*  MCV 83.7 84.0  MCH 27.2 26.8  MCHC 32.5 31.9  RDW 14.2 14.2  PLT 174 172    Cardiac Enzymes Recent Labs Lab 02/14/17 2251 02/15/17 0356 02/15/17 1111  TROPONINI <0.03 <0.03 <0.03    Recent Labs Lab 02/14/17 1812  TROPIPOC 0.00     BNP Recent Labs Lab 02/14/17 1755  BNP 416.6*     DDimer No results for input(s): DDIMER in the last 168 hours.   Radiology    No results found.  Cardiac Studies   ECHO:    - Left ventricle: The cavity size was normal. Wall thickness was   increased in a pattern of mild LVH. Indeterminant diastolic   function (atrial fibrillation). The estimated ejection fraction   was 45%. Diffuse hypokinesis. - Aortic valve: There was  no stenosis. There was trivial   regurgitation. - Mitral valve: There was mild regurgitation. - Left atrium: The atrium was moderately dilated. - Right ventricle: The cavity size was normal. Systolic function   was normal. - Right atrium: The atrium was mildly dilated. - Tricuspid valve: Peak RV-RA gradient (S): 21 mm Hg. - Pulmonary arteries: PA peak pressure: 36 mm Hg (S). - Systemic veins: IVC measured 2.3 cm with < 50% respirophasic   variation, suggesting RA pressure 15 mmHg. - Pericardium, extracardiac: Small circumferential pericardial   effusion.  Impressions:  - The patient was in atrial fibrillation. Normal LV size with mild   LV hypertrophy. EF 45%, diffuse hypokinesis. Mild mitral   regurgitation. Normal RV size and systolic function. Borderline   pulmonary hypertension. Dilated IVC suggestive of elevated RV   filling pressure.  Patient Profile     71 y.o. female with past medical history of HTN, hyperlipidemia, DVT (2005), cardiomegaly and arthritis. She presented to her PCP on 02/14/17 for evaluation of shortness of breath when walking across the room, increased edema and 20 pound weight gain over 2 weeks. She  was found to be in atrial fibrillation with RVR and was directed to the ED. She has no known history of afib or heart failure. She was placed on cardizem drip.    Assessment & Plan    ATRIAL FIB WITH RVR:   Rate control is not adequate and BP will make med titration difficult.  She is on Xarelto.  I will arrange for TEE/DCCV tomorrow and add metoprolol today.    HTN:   I stopped the ARB which she got yesterday.  BP is low.  Need to be able to titrate beta blocker a Cardizem  CARDIOMYOPATHY/ACUTE SYSTOLIC HF:  New finding.  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  I will follow up as an out patient.     Signed, Minus Breeding, MD  02/16/2017, 11:40 AM

## 2017-02-16 NOTE — Progress Notes (Signed)
  Echocardiogram 2D Echocardiogram has been performed.  Darlina Sicilian M 02/16/2017, 9:32 AM

## 2017-02-17 ENCOUNTER — Encounter (HOSPITAL_COMMUNITY): Payer: Self-pay | Admitting: Cardiovascular Disease

## 2017-02-17 ENCOUNTER — Inpatient Hospital Stay (HOSPITAL_COMMUNITY): Payer: Medicare Other | Admitting: Certified Registered Nurse Anesthetist

## 2017-02-17 ENCOUNTER — Inpatient Hospital Stay (HOSPITAL_COMMUNITY): Payer: Medicare Other

## 2017-02-17 ENCOUNTER — Encounter (HOSPITAL_COMMUNITY)
Admission: EM | Disposition: A | Discharge status: Home / Self Care | Payer: Self-pay | Source: Home / Self Care | Attending: Cardiology

## 2017-02-17 DIAGNOSIS — G40909 Epilepsy, unspecified, not intractable, without status epilepticus: Secondary | ICD-10-CM

## 2017-02-17 DIAGNOSIS — I351 Nonrheumatic aortic (valve) insufficiency: Secondary | ICD-10-CM

## 2017-02-17 HISTORY — DX: Epilepsy, unspecified, not intractable, without status epilepticus: G40.909

## 2017-02-17 HISTORY — PX: TEE WITHOUT CARDIOVERSION: SHX5443

## 2017-02-17 HISTORY — PX: CARDIOVERSION: SHX1299

## 2017-02-17 LAB — BASIC METABOLIC PANEL
ANION GAP: 10 (ref 5–15)
BUN: 15 mg/dL (ref 6–20)
CHLORIDE: 105 mmol/L (ref 101–111)
CO2: 27 mmol/L (ref 22–32)
CREATININE: 0.97 mg/dL (ref 0.44–1.00)
Calcium: 8.8 mg/dL — ABNORMAL LOW (ref 8.9–10.3)
GFR calc non Af Amer: 57 mL/min — ABNORMAL LOW (ref >60)
Glucose, Bld: 111 mg/dL — ABNORMAL HIGH (ref 65–99)
Potassium: 3.8 mmol/L (ref 3.5–5.1)
SODIUM: 142 mmol/L (ref 135–145)

## 2017-02-17 SURGERY — ECHOCARDIOGRAM, TRANSESOPHAGEAL
Anesthesia: General

## 2017-02-17 MED ORDER — DILTIAZEM HCL ER COATED BEADS 240 MG PO CP24: 240.0000 mg | ORAL_CAPSULE | Freq: Every day | ORAL | 6 refills | Status: DC

## 2017-02-17 MED ORDER — PHENYLEPHRINE 40 MCG/ML (10ML) SYRINGE FOR IV PUSH (FOR BLOOD PRESSURE SUPPORT)
PREFILLED_SYRINGE | INTRAVENOUS | Status: DC | PRN
  Administered 2017-02-17 (×4): 40 ug via INTRAVENOUS

## 2017-02-17 MED ORDER — PROPOFOL 500 MG/50ML IV EMUL
INTRAVENOUS | Status: DC | PRN
  Administered 2017-02-17: 100 ug/kg/min via INTRAVENOUS

## 2017-02-17 MED ORDER — SODIUM CHLORIDE 0.9 % IV SOLN
INTRAVENOUS | Status: DC | PRN
  Administered 2017-02-17: 11:00:00 via INTRAVENOUS

## 2017-02-17 MED ORDER — METOPROLOL SUCCINATE ER 25 MG PO TB24: 25.0000 mg | ORAL_TABLET | Freq: Every day | ORAL | 6 refills | Status: DC

## 2017-02-17 MED ORDER — FUROSEMIDE 40 MG PO TABS: 40.0000 mg | ORAL_TABLET | Freq: Every day | ORAL | 6 refills | Status: DC

## 2017-02-17 MED ORDER — PROPOFOL 10 MG/ML IV BOLUS
INTRAVENOUS | Status: DC | PRN
  Administered 2017-02-17: 10 mg via INTRAVENOUS

## 2017-02-17 MED ORDER — SODIUM CHLORIDE 0.9 % IV SOLN
INTRAVENOUS | Status: DC
  Administered 2017-02-17: 500 mL via INTRAVENOUS

## 2017-02-17 MED ORDER — BUTAMBEN-TETRACAINE-BENZOCAINE 2-2-14 % EX AERO
INHALATION_SPRAY | CUTANEOUS | Status: DC | PRN
  Administered 2017-02-17: 2 via TOPICAL

## 2017-02-17 MED ORDER — RIVAROXABAN 20 MG PO TABS: 20.0000 mg | ORAL_TABLET | Freq: Every day | ORAL | 6 refills | Status: DC

## 2017-02-17 NOTE — Interval H&P Note (Signed)
History and Physical Interval Note:  02/17/2017 7:32 AM  Ashley Pacheco  has presented today for surgery, with the diagnosis of AFIB  The various methods of treatment have been discussed with the patient and family. After consideration of risks, benefits and other options for treatment, the patient has consented to  Procedure(s): TRANSESOPHAGEAL ECHOCARDIOGRAM (TEE) (N/A) CARDIOVERSION (N/A) as a surgical intervention .  The patient's history has been reviewed, patient examined, no change in status, stable for surgery.  I have reviewed the patient's chart and labs.  Questions were answered to the patient's satisfaction.     Skeet Latch, MD

## 2017-02-17 NOTE — Progress Notes (Signed)
Discharged home  by wheelchair accompanied by family member, stable. Discharge instructions given to pt. Belongings taken home.

## 2017-02-17 NOTE — CV Procedure (Addendum)
Brief TEE Note  LVEF 45-50% Mild-moderate MR Mild AR, mild TR, mild PR No LA/LAA thrombus or mass  For additional details see full report.   Electrical Cardioversion Procedure Note Ashley Pacheco [id number] May 01, 1946  Procedure: Electrical Cardioversion Indications:  Atrial Fibrillation  Procedure Details Consent: Risks of procedure as well as the alternatives and risks of each were explained to the (patient/caregiver).  Consent for procedure obtained. Time Out: Verified patient identification, verified procedure, site/side was marked, verified correct patient position, special equipment/implants available, medications/allergies/relevent history reviewed, required imaging and test results available.  Performed  Patient placed on cardiac monitor, pulse oximetry, supplemental oxygen as necessary.  Sedation given: Propofol Pacer pads placed anterior and posterior chest.  Cardioverted 1 time(s).  Cardioverted at 150J.  Evaluation Findings: Post procedure EKG shows: sinus bradycardia Complications: None Patient did tolerate procedure well.   Skeet Latch, MD 02/17/2017, 11:50 AM

## 2017-02-17 NOTE — Interval H&P Note (Signed)
History and Physical Interval Note:  02/17/2017 11:26 AM  Ashley Pacheco  has presented today for surgery, with the diagnosis of AFIB  The various methods of treatment have been discussed with the patient and family. After consideration of risks, benefits and other options for treatment, the patient has consented to  Procedure(s): TRANSESOPHAGEAL ECHOCARDIOGRAM (TEE) (N/A) CARDIOVERSION (N/A) as a surgical intervention .  The patient's history has been reviewed, patient examined, no change in status, stable for surgery.  I have reviewed the patient's chart and labs.  Questions were answered to the patient's satisfaction.     Skeet Latch, MD

## 2017-02-17 NOTE — Anesthesia Preprocedure Evaluation (Addendum)
Anesthesia Evaluation  Patient identified by MRN, date of birth, ID band Patient awake    Reviewed: Allergy & Precautions, NPO status , Patient's Chart, lab work & pertinent test results  History of Anesthesia Complications Negative for: history of anesthetic complications  Airway Mallampati: II  TM Distance: >3 FB Neck ROM: Full    Dental  (+) Dental Advisory Given, Teeth Intact,    Pulmonary former smoker,    breath sounds clear to auscultation- rhonchi       Cardiovascular hypertension, Pt. on medications + Peripheral Vascular Disease and + DVT  + dysrhythmias Atrial Fibrillation  Rhythm:Irregular     Neuro/Psych Seizures -, Well Controlled,     GI/Hepatic negative GI ROS, Neg liver ROS,   Endo/Other  Morbid obesity  Renal/GU Renal disease     Musculoskeletal  (+) Arthritis ,   Abdominal   Peds  Hematology  (+) anemia ,   Anesthesia Other Findings   Reproductive/Obstetrics                           Anesthesia Physical Anesthesia Plan  ASA: III  Anesthesia Plan: MAC   Post-op Pain Management:    Induction: Intravenous  Airway Management Planned: Natural Airway and Simple Face Mask  Additional Equipment: None  Intra-op Plan:   Post-operative Plan:   Informed Consent: I have reviewed the patients History and Physical, chart, labs and discussed the procedure including the risks, benefits and alternatives for the proposed anesthesia with the patient or authorized representative who has indicated his/her understanding and acceptance.   Dental advisory given  Plan Discussed with: CRNA and Anesthesiologist  Anesthesia Plan Comments:        Anesthesia Quick Evaluation

## 2017-02-17 NOTE — H&P (View-Only) (Signed)
Progress Note  Patient Name: Ashley Pacheco Date of Encounter: 02/17/2017  Primary Cardiologist:   Dr. Marlou Porch  Subjective   She is breathing OK.  Heart rate is still increased with minimal activity.   Inpatient Medications    Scheduled Meds: . diltiazem  240 mg Oral Daily  . furosemide  40 mg Oral Daily  . levETIRAcetam  500 mg Oral BID  . metoprolol tartrate  12.5 mg Oral TID  . pravastatin  80 mg Oral q1800  . rivaroxaban  20 mg Oral Q supper  . sertraline  100 mg Oral Daily  . sodium chloride flush  3 mL Intravenous Q12H  . traZODone  150 mg Oral QHS   Continuous Infusions: . sodium chloride     PRN Meds: acetaminophen, ondansetron (ZOFRAN) IV, sodium chloride flush   Vital Signs    Vitals:   02/16/17 2348 02/16/17 2359 02/17/17 0321 02/17/17 0819  BP: (!) 108/92 91/68 (!) 162/111 (!) 131/100  Pulse: (!) 101 (!) 102 89 84  Resp:  (!) 24 20 19   Temp:  97.7 F (36.5 C) 98.4 F (36.9 C) 98.3 F (36.8 C)  TempSrc:  Oral Oral Oral  SpO2:  97% 91% 97%  Weight:      Height:        Intake/Output Summary (Last 24 hours) at 02/17/17 0827 Last data filed at 02/17/17 0400  Gross per 24 hour  Intake              480 ml  Output              350 ml  Net              130 ml   Filed Weights   02/14/17 1739 02/15/17 0400  Weight: 235 lb (106.6 kg) 235 lb (106.6 kg)    Telemetry    Atrial fib with improved rate but still rapid - Personally Reviewed  ECG    NA - Personally Reviewed  Physical Exam   GEN: No distress Neck: No  JVD at 90 degrees Cardiac: IrrgeularRR, no murmurs  Respiratory: Clear  to auscultation bilaterally.  No wheezing GI: Soft, nontender, non-distended  MS: Mild edema; No deformity. Neuro:  Nonfocal  Psych: Normal affect   Labs    Chemistry  Recent Labs Lab 02/15/17 0356 02/16/17 0304 02/17/17 0224  NA 144 143 142  K 3.5 3.6 3.8  CL 106 108 105  CO2 29 28 27   GLUCOSE 97 106* 111*  BUN 11 12 15   CREATININE 0.77 0.78 0.97   CALCIUM 8.4* 8.4* 8.8*  GFRNONAA >60 >60 57*  GFRAA >60 >60 >60  ANIONGAP 9 7 10      Hematology  Recent Labs Lab 02/14/17 1755 02/15/17 0356  WBC 6.5 6.3  RBC 4.16 4.25  HGB 11.3* 11.4*  HCT 34.8* 35.7*  MCV 83.7 84.0  MCH 27.2 26.8  MCHC 32.5 31.9  RDW 14.2 14.2  PLT 174 172    Cardiac Enzymes  Recent Labs Lab 02/14/17 2251 02/15/17 0356 02/15/17 1111  TROPONINI <0.03 <0.03 <0.03     Recent Labs Lab 02/14/17 1812  TROPIPOC 0.00     BNP  Recent Labs Lab 02/14/17 1755  BNP 416.6*     DDimer No results for input(s): DDIMER in the last 168 hours.   Radiology    No results found.  Cardiac Studies   ECHO:    - Left ventricle: The cavity size was normal. Wall thickness was  increased in a pattern of mild LVH. Indeterminant diastolic   function (atrial fibrillation). The estimated ejection fraction   was 45%. Diffuse hypokinesis. - Aortic valve: There was no stenosis. There was trivial   regurgitation. - Mitral valve: There was mild regurgitation. - Left atrium: The atrium was moderately dilated. - Right ventricle: The cavity size was normal. Systolic function   was normal. - Right atrium: The atrium was mildly dilated. - Tricuspid valve: Peak RV-RA gradient (S): 21 mm Hg. - Pulmonary arteries: PA peak pressure: 36 mm Hg (S). - Systemic veins: IVC measured 2.3 cm with < 50% respirophasic   variation, suggesting RA pressure 15 mmHg. - Pericardium, extracardiac: Small circumferential pericardial   effusion.    Patient Profile     71 y.o. female with past medical history of HTN, hyperlipidemia, DVT (2005), cardiomegaly and arthritis. She presented to her PCP on 02/14/17 for evaluation of shortness of breath when walking across the room, increased edema and 20 pound weight gain over 2 weeks. She was found to be in atrial fibrillation with RVR and was directed to the ED. She has no known history of afib or heart failure. She was placed on cardizem  drip.    Assessment & Plan    ATRIAL FIB WITH RVR:   Rate control is not adequate despite adding beta blocker yesterday.  She is on Xarelto.  She will have  TEE/DCCV today.     HTN:   Her BP fluctuates.  I will plan on her going home on Cardizem CD 240 and metoprolol XL 25 mg.  She should have a home BP cuff for follow up.   CARDIOMYOPATHY/ACUTE SYSTOLIC HF:  New finding.  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  I will follow up as an out patient.  Continue Lasix   Home today after DCCV likely.    Signed, Minus Breeding, MD  02/17/2017, 8:27 AM

## 2017-02-17 NOTE — Discharge Summary (Signed)
Discharge Summary    Patient ID: Ashley Pacheco,  MRN: [id number], DOB/AGE: 29-Nov-1945 71 y.o.  Admit date: 02/14/2017 Discharge date: 02/17/2017  Primary Care Provider: Gerrit Heck Primary Cardiologist: Jerilynn Mages. Skains, MD   Discharge Diagnoses    Principal Problem:   Atrial fibrillation with rapid ventricular response (HCC)  **s/p TEE/DCCV this admission.  Active Problems:   Essential hypertension   Hyperlipidemia   Morbid obesity ([place])   Seizure disorder (HCC)  Allergies Allergies  Allergen Reactions  . Latex     bandaids cause rash    Diagnostic Studies/Procedures    2D Echocardiogram 4.25.2018  Study Conclusions   - Left ventricle: The cavity size was normal. Wall thickness was increased in a pattern of mild LVH. Indeterminant diastolic function (atrial fibrillation). The estimated ejection fraction was 45%. Diffuse hypokinesis. - Aortic valve: There was no stenosis. There was trivial   regurgitation. - Mitral valve: There was mild regurgitation. - Left atrium: The atrium was moderately dilated. - Right ventricle: The cavity size was normal. Systolic function was normal. - Right atrium: The atrium was mildly dilated. - Tricuspid valve: Peak RV-RA gradient (S): 21 mm Hg. - Pulmonary arteries: PA peak pressure: 36 mm Hg (S). - Systemic veins: IVC measured 2.3 cm with < 50% respirophasic variation, suggesting RA pressure 15 mmHg. - Pericardium, extracardiac: Small circumferential pericardial   effusion.   Impressions:   - The patient was in atrial fibrillation. Normal LV size with mild  LV hypertrophy. EF 45%, diffuse hypokinesis. Mild mitral regurgitation. Normal RV size and systolic function. Borderline pulmonary hypertension. Dilated IVC suggestive of elevated RV filling pressure. _____________  Transesophageal Echocardiogram and Direct Current Cardioversion 4.26.2018  Brief TEE Note   LVEF 45-50% Mild-moderate MR Mild AR, mild TR, mild  PR No LA/LAA thrombus or mass Cardioverted 1 time(s).  Cardioverted at 150J.   Evaluation Findings: Post procedure EKG shows: sinus bradycardia Complications: None Patient did tolerate procedure well. _____________    History of Present Illness     71 y/o ? with a h/o atypical chest pain, HTN, HL, morbid obeisty, and seizure disorder.  She was in her usual state of health until approximately 2 wks prior to admission, when she began to note increasing dyspnea and weight gain (20 lbs).  Due to progressive symptoms, she was seen by her PCP on 4/23, and was found to be in rapid atrial fibrillation.  She was sent to [facility] ED for further evaluation.  Hospital Course     Consultants: None   Pt remained in rapid Afib upon arrival to the ED and was placed on IV diltiazem and also treated with IV lasix.  She was admitted by cardiology for further evaluation.  With IV diltiazem, rates improved some, though she remained difficult to adequately rate control.  Xarelto was started in the setting of a CHA2DS2VASc of 3.  She diuresed reasonably well with some symptomatic improvement.  Echo showed mild LV dysfunction, with an EF of 45%.  Due to ongoing elevation of heart rates, decision was made to perform TEE and cardioversion.  This was performed this morning and there was no evidence of LA or LAA thrombus.  Cardioversion was then successfully carried out with restoration of sinus rhythm following one 150 J shock.  She has since maintained sinus rhythm and will be discharged home today in good condition on oral  blocker, CCB, xarelto, and lasix. _____________  Discharge Vitals Blood pressure (!) 128/91, pulse 61, temperature 98.8  F (37.1 C), temperature source Oral, resp. rate 19, height 5\' 1"  (1.549 m), weight 235 lb (106.6 kg), last menstrual period 10/26/1999, SpO2 95 %.  Filed Weights   02/14/17 1739 02/15/17 0400  Weight: 235 lb (106.6 kg) 235 lb (106.6 kg)    Labs & Radiologic Studies     CBC  Recent Labs  02/14/17 1755 02/15/17 0356  WBC 6.5 6.3  HGB 11.3* 11.4*  HCT 34.8* 35.7*  MCV 83.7 84.0  PLT 174 643   Basic Metabolic Panel  Recent Labs  02/14/17 2251  02/16/17 0304 02/17/17 0224  NA  --   < > 143 142  K  --   < > 3.6 3.8  CL  --   < > 108 105  CO2  --   < > 28 27  GLUCOSE  --   < > 106* 111*  BUN  --   < > 12 15  CREATININE  --   < > 0.78 0.97  CALCIUM  --   < > 8.4* 8.8*  MG 1.9  --   --   --   < > = values in this interval not displayed. Cardiac Enzymes  Recent Labs  02/14/17 2251 02/15/17 0356 02/15/17 1111  TROPONINI <0.03 <0.03 <0.03   Thyroid Function Tests  Recent Labs  02/14/17 2251  TSH 4.332   _____________  No results found. Disposition   Pt is being discharged home today in good condition.  Follow-up Plans & Appointments    Follow-up Information    Cecilie Kicks, NP Follow up on 03/01/2017.   Specialties:  Cardiology, Radiology Why:  10:30 AM - Dr. Marlou Porch' Nurse Practitioner Contact information: [address] [place] [address] [phone]           Discharge Medications   Current Discharge Medication List    START taking these medications   Details  diltiazem (CARDIZEM CD) 240 MG 24 hr capsule Take 1 capsule (240 mg total) by mouth daily. Qty: 30 capsule, Refills: 6    furosemide (LASIX) 40 MG tablet Take 1 tablet (40 mg total) by mouth daily. Qty: 30 tablet, Refills: 6    metoprolol succinate (TOPROL XL) 25 MG 24 hr tablet Take 1 tablet (25 mg total) by mouth daily. Qty: 30 tablet, Refills: 6    rivaroxaban (XARELTO) 20 MG TABS tablet Take 1 tablet (20 mg total) by mouth daily with supper. Qty: 30 tablet, Refills: 6      CONTINUE these medications which have NOT CHANGED   Details  acetaminophen (TYLENOL 8 HOUR) 650 MG CR tablet Take 650 mg by mouth as needed.     aspirin 81 MG tablet Take 81 mg by mouth daily.    Cholecalciferol (VITAMIN D) 2000 UNITS tablet Take 2,000  Units by mouth 2 (two) times daily.     diphenhydrAMINE (BENADRYL ALLERGY) 25 MG tablet Take 25 mg by mouth as needed (allergies).     levETIRAcetam (KEPPRA) 500 MG tablet Take 1 tablet (500 mg total) by mouth 2 (two) times daily. Qty: 180 tablet, Refills: 4    lovastatin (MEVACOR) 40 MG tablet Take 40 mg by mouth at bedtime.     Multiple Vitamin (MULTI-VITAMIN DAILY PO) Take 1 tablet by mouth daily.     sertraline (ZOLOFT) 100 MG tablet Take 100 mg by mouth daily.    traZODone (DESYREL) 150 MG tablet Take 150 mg by mouth at bedtime.       STOP taking these  medications     ibuprofen (ADVIL) 200 MG tablet      MICARDIS 80 MG tablet      naproxen sodium (ANAPROX) 220 MG tablet          Outstanding Labs/Studies   f/u cbc and bmet @ f/u appt.  Duration of Discharge Encounter   Greater than 30 minutes including physician time.  Signed, Murray Hodgkins NP 02/17/2017, 1:47 PM

## 2017-02-17 NOTE — Progress Notes (Signed)
To endoscopy by wheelchair, stable.

## 2017-02-17 NOTE — Anesthesia Procedure Notes (Signed)
Procedure Name: MAC Date/Time: 02/17/2017 11:25 AM Performed by: Garrison Columbus T Pre-anesthesia Checklist: Patient identified, Emergency Drugs available, Suction available and Patient being monitored Patient Re-evaluated:Patient Re-evaluated prior to inductionOxygen Delivery Method: Nasal cannula Preoxygenation: Pre-oxygenation with 100% oxygen Intubation Type: IV induction Placement Confirmation: positive ETCO2 and breath sounds checked- equal and bilateral Dental Injury: Teeth and Oropharynx as per pre-operative assessment

## 2017-02-17 NOTE — Anesthesia Postprocedure Evaluation (Addendum)
Anesthesia Post Note  Patient: Ashley Pacheco  Procedure(s) Performed: Procedure(s) (LRB): TRANSESOPHAGEAL ECHOCARDIOGRAM (TEE) (N/A) CARDIOVERSION (N/A)  Patient location during evaluation: Endoscopy Anesthesia Type: General Level of consciousness: awake and alert Pain management: pain level controlled Vital Signs Assessment: post-procedure vital signs reviewed and stable Respiratory status: spontaneous breathing, nonlabored ventilation, respiratory function stable and patient connected to nasal cannula oxygen Cardiovascular status: blood pressure returned to baseline and stable Postop Assessment: no signs of nausea or vomiting Anesthetic complications: no       Last Vitals:  Vitals:   02/17/17 1210 02/17/17 1220  BP: 101/64 94/63  Pulse: (!) 52 (!) 56  Resp: 19 13  Temp:      Last Pain:  Vitals:   02/17/17 1204  TempSrc: Oral  PainSc:                  Zurich Carreno

## 2017-02-17 NOTE — Transfer of Care (Signed)
Immediate Anesthesia Transfer of Care Note  Patient: Ashley Pacheco  Procedure(s) Performed: Procedure(s): TRANSESOPHAGEAL ECHOCARDIOGRAM (TEE) (N/A) CARDIOVERSION (N/A)  Patient Location: Endoscopy Unit  Anesthesia Type:MAC  Level of Consciousness: awake and alert   Airway & Oxygen Therapy: Patient Spontanous Breathing and Patient connected to nasal cannula oxygen  Post-op Assessment: Report given to RN, Post -op Vital signs reviewed and stable and Patient moving all extremities X 4  Post vital signs: Reviewed  Last Vitals:  Vitals:   02/17/17 0819 02/17/17 1044  BP: (!) 131/100 (!) 120/93  Pulse: 84 (!) 115  Resp: 19 (!) 22  Temp: 36.8 C 36.8 C    Last Pain:  Vitals:   02/17/17 1044  TempSrc: Oral  PainSc:          Complications: No apparent anesthesia complications

## 2017-02-17 NOTE — H&P (View-Only) (Signed)
Progress Note  Patient Name: Ashley Pacheco Date of Encounter: 02/16/2017  Primary Cardiologist:   Dr. Marlou Porch  Subjective   The patient is breathing better.  Still with rapid heart rate.    Inpatient Medications    Scheduled Meds: . diltiazem  240 mg Oral Daily  . furosemide  40 mg Oral Daily  . levETIRAcetam  500 mg Oral BID  . pravastatin  80 mg Oral q1800  . rivaroxaban  20 mg Oral Q supper  . sertraline  100 mg Oral Daily  . telmisartan  40 mg Oral Daily  . traZODone  150 mg Oral QHS   Continuous Infusions:  PRN Meds: acetaminophen, ondansetron (ZOFRAN) IV   Vital Signs    Vitals:   02/16/17 0100 02/16/17 0400 02/16/17 0800 02/16/17 0829  BP: 114/88  (!) 118/102 (!) 95/55  Pulse: (!) 113 (!) 39 67 (!) 52  Resp: (!) 22 (!) 23 (!) 23 19  Temp:  98.4 F (36.9 C)  98.5 F (36.9 C)  TempSrc:  Oral  Oral  SpO2: (!) 88% 90% 94% 94%  Weight:      Height:        Intake/Output Summary (Last 24 hours) at 02/16/17 1140 Last data filed at 02/16/17 0800  Gross per 24 hour  Intake              315 ml  Output             1825 ml  Net            -1510 ml   Filed Weights   02/14/17 1739 02/15/17 0400  Weight: 235 lb (106.6 kg) 235 lb (106.6 kg)    Telemetry    Atrial fib with RVR - Personally Reviewed  ECG    NA - Personally Reviewed  Physical Exam   GEN: No acute distress.  No distress Neck: No  JVD Cardiac: IrrgeularRR, no murmurs, rubs, or gallops.  Respiratory: Clear  to auscultation bilaterally. GI: Soft, nontender, non-distended  MS: No  edema; No deformity. Neuro:  Nonfocal  Psych: Normal affect   Labs    Chemistry Recent Labs Lab 02/14/17 1755 02/15/17 0356 02/16/17 0304  NA 140 144 143  K 3.9 3.5 3.6  CL 108 106 108  CO2 24 29 28   GLUCOSE 98 97 106*  BUN 15 11 12   CREATININE 0.86 0.77 0.78  CALCIUM 8.6* 8.4* 8.4*  GFRNONAA >60 >60 >60  GFRAA >60 >60 >60  ANIONGAP 8 9 7      Hematology Recent Labs Lab 02/14/17 1755  02/15/17 0356  WBC 6.5 6.3  RBC 4.16 4.25  HGB 11.3* 11.4*  HCT 34.8* 35.7*  MCV 83.7 84.0  MCH 27.2 26.8  MCHC 32.5 31.9  RDW 14.2 14.2  PLT 174 172    Cardiac Enzymes Recent Labs Lab 02/14/17 2251 02/15/17 0356 02/15/17 1111  TROPONINI <0.03 <0.03 <0.03    Recent Labs Lab 02/14/17 1812  TROPIPOC 0.00     BNP Recent Labs Lab 02/14/17 1755  BNP 416.6*     DDimer No results for input(s): DDIMER in the last 168 hours.   Radiology    No results found.  Cardiac Studies   ECHO:    - Left ventricle: The cavity size was normal. Wall thickness was   increased in a pattern of mild LVH. Indeterminant diastolic   function (atrial fibrillation). The estimated ejection fraction   was 45%. Diffuse hypokinesis. - Aortic valve: There was  no stenosis. There was trivial   regurgitation. - Mitral valve: There was mild regurgitation. - Left atrium: The atrium was moderately dilated. - Right ventricle: The cavity size was normal. Systolic function   was normal. - Right atrium: The atrium was mildly dilated. - Tricuspid valve: Peak RV-RA gradient (S): 21 mm Hg. - Pulmonary arteries: PA peak pressure: 36 mm Hg (S). - Systemic veins: IVC measured 2.3 cm with < 50% respirophasic   variation, suggesting RA pressure 15 mmHg. - Pericardium, extracardiac: Small circumferential pericardial   effusion.  Impressions:  - The patient was in atrial fibrillation. Normal LV size with mild   LV hypertrophy. EF 45%, diffuse hypokinesis. Mild mitral   regurgitation. Normal RV size and systolic function. Borderline   pulmonary hypertension. Dilated IVC suggestive of elevated RV   filling pressure.  Patient Profile     71 y.o. female with past medical history of HTN, hyperlipidemia, DVT (2005), cardiomegaly and arthritis. She presented to her PCP on 02/14/17 for evaluation of shortness of breath when walking across the room, increased edema and 20 pound weight gain over 2 weeks. She  was found to be in atrial fibrillation with RVR and was directed to the ED. She has no known history of afib or heart failure. She was placed on cardizem drip.    Assessment & Plan    ATRIAL FIB WITH RVR:   Rate control is not adequate and BP will make med titration difficult.  She is on Xarelto.  I will arrange for TEE/DCCV tomorrow and add metoprolol today.    HTN:   I stopped the ARB which she got yesterday.  BP is low.  Need to be able to titrate beta blocker a Cardizem  CARDIOMYOPATHY/ACUTE SYSTOLIC HF:  New finding.  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  I will follow up as an out patient.     Signed, Minus Breeding, MD  02/16/2017, 11:40 AM

## 2017-02-17 NOTE — Progress Notes (Signed)
Progress Note  Patient Name: Ashley Pacheco Date of Encounter: 02/17/2017  Primary Cardiologist:   Dr. Marlou Porch  Subjective   She is breathing OK.  Heart rate is still increased with minimal activity.   Inpatient Medications    Scheduled Meds: . diltiazem  240 mg Oral Daily  . furosemide  40 mg Oral Daily  . levETIRAcetam  500 mg Oral BID  . metoprolol tartrate  12.5 mg Oral TID  . pravastatin  80 mg Oral q1800  . rivaroxaban  20 mg Oral Q supper  . sertraline  100 mg Oral Daily  . sodium chloride flush  3 mL Intravenous Q12H  . traZODone  150 mg Oral QHS   Continuous Infusions: . sodium chloride     PRN Meds: acetaminophen, ondansetron (ZOFRAN) IV, sodium chloride flush   Vital Signs    Vitals:   02/16/17 2348 02/16/17 2359 02/17/17 0321 02/17/17 0819  BP: (!) 108/92 91/68 (!) 162/111 (!) 131/100  Pulse: (!) 101 (!) 102 89 84  Resp:  (!) 24 20 19   Temp:  97.7 F (36.5 C) 98.4 F (36.9 C) 98.3 F (36.8 C)  TempSrc:  Oral Oral Oral  SpO2:  97% 91% 97%  Weight:      Height:        Intake/Output Summary (Last 24 hours) at 02/17/17 0827 Last data filed at 02/17/17 0400  Gross per 24 hour  Intake              480 ml  Output              350 ml  Net              130 ml   Filed Weights   02/14/17 1739 02/15/17 0400  Weight: 235 lb (106.6 kg) 235 lb (106.6 kg)    Telemetry    Atrial fib with improved rate but still rapid - Personally Reviewed  ECG    NA - Personally Reviewed  Physical Exam   GEN: No distress Neck: No  JVD at 90 degrees Cardiac: IrrgeularRR, no murmurs  Respiratory: Clear  to auscultation bilaterally.  No wheezing GI: Soft, nontender, non-distended  MS: Mild edema; No deformity. Neuro:  Nonfocal  Psych: Normal affect   Labs    Chemistry  Recent Labs Lab 02/15/17 0356 02/16/17 0304 02/17/17 0224  NA 144 143 142  K 3.5 3.6 3.8  CL 106 108 105  CO2 29 28 27   GLUCOSE 97 106* 111*  BUN 11 12 15   CREATININE 0.77 0.78 0.97   CALCIUM 8.4* 8.4* 8.8*  GFRNONAA >60 >60 57*  GFRAA >60 >60 >60  ANIONGAP 9 7 10      Hematology  Recent Labs Lab 02/14/17 1755 02/15/17 0356  WBC 6.5 6.3  RBC 4.16 4.25  HGB 11.3* 11.4*  HCT 34.8* 35.7*  MCV 83.7 84.0  MCH 27.2 26.8  MCHC 32.5 31.9  RDW 14.2 14.2  PLT 174 172    Cardiac Enzymes  Recent Labs Lab 02/14/17 2251 02/15/17 0356 02/15/17 1111  TROPONINI <0.03 <0.03 <0.03     Recent Labs Lab 02/14/17 1812  TROPIPOC 0.00     BNP  Recent Labs Lab 02/14/17 1755  BNP 416.6*     DDimer No results for input(s): DDIMER in the last 168 hours.   Radiology    No results found.  Cardiac Studies   ECHO:    - Left ventricle: The cavity size was normal. Wall thickness was  increased in a pattern of mild LVH. Indeterminant diastolic   function (atrial fibrillation). The estimated ejection fraction   was 45%. Diffuse hypokinesis. - Aortic valve: There was no stenosis. There was trivial   regurgitation. - Mitral valve: There was mild regurgitation. - Left atrium: The atrium was moderately dilated. - Right ventricle: The cavity size was normal. Systolic function   was normal. - Right atrium: The atrium was mildly dilated. - Tricuspid valve: Peak RV-RA gradient (S): 21 mm Hg. - Pulmonary arteries: PA peak pressure: 36 mm Hg (S). - Systemic veins: IVC measured 2.3 cm with < 50% respirophasic   variation, suggesting RA pressure 15 mmHg. - Pericardium, extracardiac: Small circumferential pericardial   effusion.    Patient Profile     71 y.o. female with past medical history of HTN, hyperlipidemia, DVT (2005), cardiomegaly and arthritis. She presented to her PCP on 02/14/17 for evaluation of shortness of breath when walking across the room, increased edema and 20 pound weight gain over 2 weeks. She was found to be in atrial fibrillation with RVR and was directed to the ED. She has no known history of afib or heart failure. She was placed on cardizem  drip.    Assessment & Plan    ATRIAL FIB WITH RVR:   Rate control is not adequate despite adding beta blocker yesterday.  She is on Xarelto.  She will have  TEE/DCCV today.     HTN:   Her BP fluctuates.  I will plan on her going home on Cardizem CD 240 and metoprolol XL 25 mg.  She should have a home BP cuff for follow up.   CARDIOMYOPATHY/ACUTE SYSTOLIC HF:  New finding.  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  I will follow up as an out patient.  Continue Lasix   Home today after DCCV likely.    Signed, Minus Breeding, MD  02/17/2017, 8:27 AM

## 2017-02-18 ENCOUNTER — Encounter (HOSPITAL_COMMUNITY): Payer: Self-pay | Admitting: Cardiovascular Disease

## 2017-02-28 NOTE — Progress Notes (Signed)
Cardiology Office Note   Date:  03/01/2017   ID:  Ashley Pacheco, DOB May 17, 1946, MRN [id number]  PCP:  Leighton Ruff, MD  Cardiologist:   Dr. Marlou Porch  Chief Complaint  Patient presents with  . Hospitalization Follow-up    a fib with RVR      History of Present Illness: Ashley Pacheco is a 71 y.o. female who presents for post hospitalization for a fib with RVR.  She underwent TEE and DCCV on 02/17/17.   She presented to ER 4/23/ from PCP with a fib with RVR and wt gain of 20 lbs.  Placed on IV dilt and placed on IV lasix.  Xarelto was started in the setting of a CHA2DS2VASc of 3.  She did diuresis. Echo showed mild LV dysfunction, with an EF of 45%.  Due to ongoing elevation of heart rates, decision was made to perform TEE and cardioversion.  She underwent TEE DCCV successfully and no evidence of LA or LAA thrombus.   She did well post op and maintained SR.  D/c'd on BB, CCB, Xarelto and lasix.   Prior hx of slight heart murmur and echo in 2015 with NL LV function, and stress test neg for ischemia.  Other hx of HTN, HLD, morbid obesity and seizure disorder.    Today she has no chest pain and not SOB.  She remains in SR.  She had several questions concerning a fib and how she would know she was in it.  We discussed her symptoms.  All questions answered.    Pt had no awareness of irregular heart beat in the a fib.  No bleeding with the xarelto.  Needs CBC ad BMET today.     Past Medical History:  Diagnosis Date  . 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 Atypical chest pain    a. 2015 Neg MV.  . Cardiomegaly 2005  . Cardiomyopathy ([place])    a. 01/2017 Echo: EF 45%, diff HK, mild LVH, triv AI, mild MR, mod dil LA, mildly dil RA, PASP 5mmHg - in setting of Afib.  [name] DVT (deep venous thrombosis) ([place]) 02/2004  . Fibroid   . Hypercholesteremia   . Hypertension   . Menometrorrhagia 3/92   menopausal  . Microhematuria 1/96  . Nephrolithiasis 2015  . PAF (paroxysmal atrial fibrillation) ([place])    a. 01/2017 TEE/DCCV: EF 45-50%, no LA/LAA thrombus-->successful DCCV @ 150 J. CHA2DS2VASc = 3-->Xarelto.  . Seizures ([place])    followed by [facility]- Dr. Leta Baptist  . SUI (stress urinary incontinence, female) 9/91  . Vulvar cyst 7/90    Past Surgical History:  Procedure Laterality Date  . APPENDECTOMY  1950  . BUNIONECTOMY  7/11   second toe  . CARDIOVERSION N/A 02/17/2017   Procedure: CARDIOVERSION;  Surgeon: Skeet Latch, MD;  Location: [facility];  Service: Cardiovascular;  Laterality: N/A;  . cataracts  3/09 4/09  . DILATION AND CURETTAGE OF UTERUS     hystreroscopy/imcomplete polypectomy  . KNEE SURGERY  10/13 or 11/13   right knee arthroscopy  . TEE WITHOUT CARDIOVERSION N/A 02/17/2017   Procedure: TRANSESOPHAGEAL ECHOCARDIOGRAM (TEE);  Surgeon: Skeet Latch, MD;  Location: [place];  Service: Cardiovascular;  Laterality: N/A;  . [place]  . TUBAL LIGATION  1975   BTL     Current Outpatient Prescriptions  Medication Sig Dispense Refill  . acetaminophen (TYLENOL 8 HOUR) 650 MG CR tablet Take 650 mg by mouth as needed.     [name]  aspirin 81 MG tablet Take 81 mg by mouth daily.    . Cholecalciferol (VITAMIN D) 2000 UNITS tablet Take 2,000 Units by mouth 2 (two) times daily.     [name] diltiazem (CARDIZEM CD) 240 MG 24 hr capsule Take 1 capsule (240 mg total) by mouth daily. 30 capsule 6  . diphenhydrAMINE (BENADRYL ALLERGY) 25 MG tablet Take 25 mg by mouth as needed (allergies).     . furosemide (LASIX) 40 MG tablet Take 1 tablet (40 mg total) by mouth daily. 30 tablet 6  . levETIRAcetam (KEPPRA) 500 MG tablet Take 1 tablet (500 mg total) by mouth 2 (two) times daily. 180 tablet 4  . lovastatin (MEVACOR) 40 MG tablet Take 40 mg by mouth at bedtime.     . metoprolol succinate (TOPROL XL) 25 MG 24 hr tablet Take 1 tablet (25 mg total) by mouth daily. 30 tablet 6  . Multiple Vitamin (MULTI-VITAMIN DAILY PO) Take 1 tablet by mouth daily.      . rivaroxaban (XARELTO) 20 MG TABS tablet Take 1 tablet (20 mg total) by mouth daily with supper. 30 tablet 6  . sertraline (ZOLOFT) 100 MG tablet Take 100 mg by mouth daily.    . traZODone (DESYREL) 150 MG tablet Take 150 mg by mouth at bedtime.      No current facility-administered medications for this visit.     Allergies:   Latex    Social History:  The patient  reports that she has quit smoking. Her smoking use included Cigarettes. She has never used smokeless tobacco. She reports that she drinks about 4.2 oz of alcohol per week . She reports that she does not use drugs.   Family History:  The patient's family history includes Asthma in her mother; Cancer in her father and paternal uncle; Heart attack in her maternal uncle; Heart disease in her brother and sister; Hypertension in her maternal uncle and mother; Kidney Stones in her father and mother; Skin cancer in her other; Stroke in her maternal grandfather and maternal uncle.    ROS:  General:no colds or fevers, + weight loss Skin:no rashes or ulcers HEENT:no blurred vision, no congestion CV:see HPI PUL:see HPI GI:no diarrhea constipation or melena, no indigestion GU:no hematuria, no dysuria MS:no joint pain, no claudication Neuro:no syncope, no lightheadedness Endo:no diabetes, no thyroid disease  Wt Readings from Last 3 Encounters:  03/01/17 223 lb 6.4 oz (101.3 kg)  02/15/17 235 lb (106.6 kg)  10/28/16 223 lb (101.2 kg)     PHYSICAL EXAM: VS:  BP 126/90   Pulse 67   Ht 5\' 1"  (1.549 m)   Wt 223 lb 6.4 oz (101.3 kg)   LMP 10/26/1999   SpO2 94%   BMI 42.21 kg/m  , BMI Body mass index is 42.21 kg/m. General:Pleasant affect, NAD Skin:Warm and dry, brisk capillary refill HEENT:normocephalic, sclera clear, mucus membranes moist Neck:supple, no JVD, no bruits  Heart:S1S2 RRR without murmur, gallup, rub or click Lungs:clear without rales, rhonchi, or wheezes BJY:NWGN, non tender, + BS, do not palpate liver spleen  or masses Ext:no lower ext edema, 2+ pedal pulses, 2+ radial pulses Neuro:alert and oriented, MAE, follows commands, + facial symmetry    EKG:  EKG is ordered today. The ekg ordered today demonstrates SB at 35, no acute changes from previous.   Recent Labs: 02/14/2017: B Natriuretic Peptide 416.6; Magnesium 1.9; TSH 4.332 02/15/2017: Hemoglobin 11.4; Platelets 172 02/17/2017: BUN 15; Creatinine, Ser 0.97; Potassium 3.8; Sodium 142    Lipid  Panel No results found for: CHOL, TRIG, HDL, CHOLHDL, VLDL, LDLCALC, LDLDIRECT     Other studies Reviewed: Additional studies/ records that were reviewed today include: .  ECHO 02/16/17 Study Conclusions  - Left ventricle: The cavity size was normal. Wall thickness was   increased in a pattern of mild LVH. Indeterminant diastolic   function (atrial fibrillation). The estimated ejection fraction   was 45%. Diffuse hypokinesis. - Aortic valve: There was no stenosis. There was trivial   regurgitation. - Mitral valve: There was mild regurgitation. - Left atrium: The atrium was moderately dilated. - Right ventricle: The cavity size was normal. Systolic function   was normal. - Right atrium: The atrium was mildly dilated. - Tricuspid valve: Peak RV-RA gradient (S): 21 mm Hg. - Pulmonary arteries: PA peak pressure: 36 mm Hg (S). - Systemic veins: IVC measured 2.3 cm with < 50% respirophasic   variation, suggesting RA pressure 15 mmHg. - Pericardium, extracardiac: Small circumferential pericardial   effusion.  Impressions:  - The patient was in atrial fibrillation. Normal LV size with mild   LV hypertrophy. EF 45%, diffuse hypokinesis. Mild mitral   regurgitation. Normal RV size and systolic function. Borderline   pulmonary hypertension. Dilated IVC suggestive of elevated RV   filling pressure.   TEE and DCCV  02/17/17 Study Conclusions  - Left ventricle: The cavity size was normal. Wall thickness was   normal. Systolic function  was moderately reduced. The estimated   ejection fraction was in the range of 35% to 40%. Diffuse   hypokinesis. No evidence of thrombus. - Aortic valve: No evidence of vegetation. There was mild   regurgitation. - Mitral valve: There was mild regurgitation. Partially torn chord   involving the P3 segment. - Left atrium: No evidence of thrombus in the atrial cavity or   appendage. No evidence of thrombus in the appendage. - Right atrium: No evidence of thrombus in the atrial cavity or   appendage. - Atrial septum: No defect or patent foramen ovale was identified   by color flow Doppler. - Pulmonic valve: No evidence of vegetation.  Impressions:  - Successful cardioversion. No cardiac source of emboli was   indentified.  ASSESSMENT AND PLAN:  1.  A fib with RVR converted with TEE and DCCV, maintaining SR  Follow up with Dr. Marlou Porch in 2-3 months  2. HTN, stable on current meds  wil continue lasix for now.  3. Anticoagulation on xarelto without bleeding  4. HLD stable on statin  5. Morbid obesity aware of need for wt loss- wt is down to her Jan wt after using lasix for edema  6. Hx of seizure disorder no recent seizures controlled with Keppra.    Current medicines are reviewed with the patient today.  The patient Has no concerns regarding medicines.  The following changes have been made:  See above Labs/ tests ordered today include:see above  Disposition:   FU:  see above  Signed, Cecilie Kicks, NP  03/01/2017 11:06 AM    [facility], [place], [place], [place] Phone: [phone]; Fax: [phone]

## 2017-03-01 ENCOUNTER — Ambulatory Visit (INDEPENDENT_AMBULATORY_CARE_PROVIDER_SITE_OTHER): Payer: Medicare Other | Admitting: Cardiology

## 2017-03-01 ENCOUNTER — Encounter: Payer: Self-pay | Admitting: Cardiology

## 2017-03-01 VITALS — BP 126/90 | HR 67 | Ht 61.0 in | Wt 223.4 lb

## 2017-03-01 DIAGNOSIS — E782 Mixed hyperlipidemia: Secondary | ICD-10-CM

## 2017-03-01 DIAGNOSIS — Z7901 Long term (current) use of anticoagulants: Secondary | ICD-10-CM | POA: Diagnosis not present

## 2017-03-01 DIAGNOSIS — Z87898 Personal history of other specified conditions: Secondary | ICD-10-CM

## 2017-03-01 DIAGNOSIS — I270 Primary pulmonary hypertension: Secondary | ICD-10-CM

## 2017-03-01 DIAGNOSIS — I481 Persistent atrial fibrillation: Secondary | ICD-10-CM | POA: Diagnosis not present

## 2017-03-01 DIAGNOSIS — I4819 Other persistent atrial fibrillation: Secondary | ICD-10-CM

## 2017-03-01 DIAGNOSIS — I10 Essential (primary) hypertension: Secondary | ICD-10-CM | POA: Diagnosis not present

## 2017-03-01 LAB — CBC
HEMATOCRIT: 40.1 % (ref 34.0–46.6)
HEMOGLOBIN: 13.6 g/dL (ref 11.1–15.9)
MCH: 27.3 pg (ref 26.6–33.0)
MCHC: 33.9 g/dL (ref 31.5–35.7)
MCV: 80 fL (ref 79–97)
Platelets: 231 10*3/uL (ref 150–379)
RBC: 4.99 x10E6/uL (ref 3.77–5.28)
RDW: 14 % (ref 12.3–15.4)
WBC: 8.7 10*3/uL (ref 3.4–10.8)

## 2017-03-01 LAB — BASIC METABOLIC PANEL
BUN/Creatinine Ratio: 17 (ref 12–28)
BUN: 14 mg/dL (ref 8–27)
CALCIUM: 9.1 mg/dL (ref 8.7–10.3)
CO2: 26 mmol/L (ref 18–29)
CREATININE: 0.82 mg/dL (ref 0.57–1.00)
Chloride: 100 mmol/L (ref 96–106)
GFR calc Af Amer: 83 mL/min/{1.73_m2} (ref >59)
GFR, EST NON AFRICAN AMERICAN: 72 mL/min/{1.73_m2} (ref >59)
GLUCOSE: 83 mg/dL (ref 65–99)
POTASSIUM: 4.1 mmol/L (ref 3.5–5.2)
Sodium: 143 mmol/L (ref 134–144)

## 2017-03-01 NOTE — Patient Instructions (Signed)
Medication Instructions:  None Ordered   Labwork: Your physician recommends that you return for lab work in: Today for BMET, CBC  Testing/Procedures: None Ordered   Follow-Up: Your physician recommends that you schedule a follow-up appointment in: 2-3 months with Dr. Marlou Porch.   Any Other Special Instructions Will Be Listed Below (If Applicable).     If you need a refill on your cardiac medications before your next appointment, please call your pharmacy.  Thank you for choosing [facility]

## 2017-03-02 ENCOUNTER — Ambulatory Visit: Payer: Medicare Other | Admitting: Physician Assistant

## 2017-03-28 NOTE — Addendum Note (Signed)
Addendum  created 03/28/17 1426 by Oleta Mouse, MD   Sign clinical note

## 2017-04-01 ENCOUNTER — Encounter: Payer: Self-pay | Admitting: Diagnostic Neuroimaging

## 2017-04-01 ENCOUNTER — Ambulatory Visit (INDEPENDENT_AMBULATORY_CARE_PROVIDER_SITE_OTHER): Payer: Medicare Other | Admitting: Diagnostic Neuroimaging

## 2017-04-01 VITALS — BP 137/69 | HR 51 | Wt 227.4 lb

## 2017-04-01 DIAGNOSIS — G40909 Epilepsy, unspecified, not intractable, without status epilepticus: Secondary | ICD-10-CM | POA: Diagnosis not present

## 2017-04-01 MED ORDER — LEVETIRACETAM 500 MG PO TABS: 500.0000 mg | ORAL_TABLET | Freq: Two times a day (BID) | ORAL | 4 refills | Status: DC

## 2017-04-01 NOTE — Progress Notes (Signed)
[facility]  PATIENT: Ashley Pacheco DOB: 06-20-1946  REFERRING CLINICIAN: Edwin Dada HISTORY FROM: patient  REASON FOR VISIT: follow up   HISTORICAL  CHIEF COMPLAINT:  Chief Complaint  Patient presents with  . Seizures    rm 6, "no seizure activity"  . Follow-up    6 month    HISTORY OF PRESENT ILLNESS:   UPDATE 04/01/17: Since last visit, was in [place] in April 2018, dx'd with afib and RVR, now on xarelto and diltiazem. Now doing well. No seizures. Tolerating LEV.   UPDATE 10/01/16: Since last visit, doing well. No sz. Tolerating LEV. No other abnl spells. She has some snoring, but no sig daytime fatigue. She is concerned about risk of stroke due to her family history.   PRIOR HPI (08/13/16): 71 year old right-handed female here for evaluation of possible seizure. Patient has history of hypertension, hyperglycemia depression and heart flutter. 08/10/16, patient was at home playing solitaire on her tablet. She was sitting in a lounge chair where she sometimes takes a nap in the afternoon. Around 2 PM patient noticed that this screen on the tablet was "wavy". Patient looked away from the tablet and saw an afterimage of the tablet follow her visual tracking to the right side. Patient then tried to use the TV remote to turn off the television. She tried pressing a few buttons was unable to turn off the TV. The next thing she remembered she was waking up, noticed that the TV remote was on the floor and the television was still on. She then noticed that her tongue and throat were very sore. She got up and looked in the mirror she found that she had severely bitten her tongue on both sides. No incontinence. No definite postictal confusion. She did not feel sore in her arms or legs. In retrospect patient had a similar but minor event in spring of 2017 where she was using her tablet and saw a "wavy screen" lasting for approximately 1 minute. She did not have tongue biting or any  other symptoms with that episode. Patient has normal worth and development mental history. No history of seizure in the past. No head trauma, meningitis or encephalitis in the past. No family history of seizure. Patient lives alone, is fairly active, takes care of all ADLs. No recent change in medication, alcohol, sleep, stress or other events.   REVIEW OF SYSTEMS: Full 14 system review of systems performed and negative with exception of: only as per HPI.    ALLERGIES: Allergies  Allergen Reactions  . Adhesive [Tape]     rash    HOME MEDICATIONS: Outpatient Medications Prior to Visit  Medication Sig Dispense Refill  . acetaminophen (TYLENOL 8 HOUR) 650 MG CR tablet Take 650 mg by mouth as needed.     [name] aspirin 81 MG tablet Take 81 mg by mouth daily.    . Cholecalciferol (VITAMIN D) 2000 UNITS tablet Take 2,000 Units by mouth 2 (two) times daily.     [name] diltiazem (CARDIZEM CD) 240 MG 24 hr capsule Take 1 capsule (240 mg total) by mouth daily. 30 capsule 6  . diphenhydrAMINE (BENADRYL ALLERGY) 25 MG tablet Take 25 mg by mouth as needed (allergies).     . furosemide (LASIX) 40 MG tablet Take 1 tablet (40 mg total) by mouth daily. 30 tablet 6  . levETIRAcetam (KEPPRA) 500 MG tablet Take 1 tablet (500 mg total) by mouth 2 (two) times daily. 180 tablet 4  . lovastatin (MEVACOR)  40 MG tablet Take 40 mg by mouth at bedtime.     . metoprolol succinate (TOPROL XL) 25 MG 24 hr tablet Take 1 tablet (25 mg total) by mouth daily. 30 tablet 6  . Multiple Vitamin (MULTI-VITAMIN DAILY PO) Take 1 tablet by mouth daily.     . rivaroxaban (XARELTO) 20 MG TABS tablet Take 1 tablet (20 mg total) by mouth daily with supper. 30 tablet 6  . sertraline (ZOLOFT) 100 MG tablet Take 100 mg by mouth daily.    . traZODone (DESYREL) 150 MG tablet Take 150 mg by mouth at bedtime.      No facility-administered medications prior to visit.     PAST MEDICAL HISTORY: Past Medical History:  Diagnosis Date  . 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 Atypical chest pain    a. 2015 Neg MV.  . Cardiomegaly 2005  . Cardiomyopathy ([place])    a. 01/2017 Echo: EF 45%, diff HK, mild LVH, triv AI, mild MR, mod dil LA, mildly dil RA, PASP 33mmHg - in setting of Afib.  [name] DVT (deep venous thrombosis) ([place]) 02/2004  . Fibroid   . Hypercholesteremia   . Hypertension   . Menometrorrhagia 3/92   menopausal  . Microhematuria 1/96  . Nephrolithiasis 2015  . PAF (paroxysmal atrial fibrillation) ([place])    a. 01/2017 TEE/DCCV: EF 45-50%, no LA/LAA thrombus-->successful DCCV @ 150 J. CHA2DS2VASc = 3-->Xarelto.  . Seizures ([place])    followed by [facility]- Dr. Leta Baptist  . SUI (stress urinary incontinence, female) 9/91  . Vulvar cyst 7/90    PAST SURGICAL HISTORY: Past Surgical History:  Procedure Laterality Date  . APPENDECTOMY  1950  . BUNIONECTOMY  7/11   second toe  . CARDIOVERSION N/A 02/17/2017   Procedure: CARDIOVERSION;  Surgeon: Skeet Latch, MD;  Location: [place];  Service: Cardiovascular;  Laterality: N/A;  . cataracts  3/09 4/09  . DILATION AND CURETTAGE OF UTERUS     hystreroscopy/imcomplete polypectomy  . KNEE SURGERY  10/13 or 11/13   right knee arthroscopy  . TEE WITHOUT CARDIOVERSION N/A 02/17/2017   Procedure: TRANSESOPHAGEAL ECHOCARDIOGRAM (TEE);  Surgeon: Skeet Latch, MD;  Location: [place];  Service: Cardiovascular;  Laterality: N/A;  . [place]  . TUBAL LIGATION  1975   BTL    FAMILY HISTORY: Family History  Problem Relation Age of Onset  . Cancer Father        stomach and prostate  . Kidney Stones Father   . Hypertension Mother   . Asthma Mother   . Kidney Stones Mother   . Stroke Maternal Uncle   . Heart attack Maternal Uncle   . Hypertension Maternal Uncle   . Cancer Paternal Uncle   . Stroke Maternal Grandfather   . Heart disease Sister        Rapid rhythm  . Skin cancer Sister   . Heart disease Brother        Rapid rhythm  . Skin  cancer Brother     SOCIAL HISTORY:  Social History   Social History  . Marital status: Widowed    Spouse name: N/A  . Number of children: 2  . Years of education: 16   Occupational History  .      volunteer   Social History Main Topics  . Smoking status: Former Smoker    Types: Cigarettes  . Smokeless tobacco: Never Used     Comment: quit in 1969  .  Alcohol use 4.2 oz/week    7 Glasses of wine per week     Comment: 1 glass of wine with dinner most nights  . Drug use: No  . Sexual activity: No   Other Topics Concern  . Not on file   Social History Narrative   Lives alone   Caffeine- coffee- 3 mugs daily     PHYSICAL EXAM  GENERAL EXAM/CONSTITUTIONAL: Vitals:  Vitals:   04/01/17 0921  BP: 137/69  Pulse: (!) 51  Weight: 227 lb 6.4 oz (103.1 kg)   Body mass index is 42.97 kg/m. No exam data present  Patient is in no distress; well developed, nourished and groomed; neck is supple  CARDIOVASCULAR:  Examination of carotid arteries is normal; no carotid bruits  BRADYCARDIA; REGULAR RHYTHM; no murmurs  Examination of peripheral vascular system by observation and palpation is normal  EYES:  Ophthalmoscopic exam of optic discs and posterior segments is normal; no papilledema or hemorrhages  MUSCULOSKELETAL:  Gait, strength, tone, movements noted in Neurologic exam below  NEUROLOGIC: MENTAL STATUS:  No flowsheet data found.  awake, alert, oriented to person, place and time  recent and remote memory intact  normal attention and concentration  language fluent, comprehension intact, naming intact,   fund of knowledge appropriate  CRANIAL NERVE:   2nd - no papilledema on fundoscopic exam  2nd, 3rd, 4th, 6th - pupils equal and reactive to light, visual fields full to confrontation, extraocular muscles intact, no nystagmus  5th - facial sensation symmetric  7th - facial strength symmetric  8th - hearing intact  9th - palate elevates  symmetrically, uvula midline  11th - shoulder shrug symmetric  12th - tongue protrusion midline  MOTOR:   normal bulk and tone, full strength in the BUE, BLE  SENSORY:   normal and symmetric to light touch, temperature, vibration  COORDINATION:   finger-nose-finger, fine finger movements normal  REFLEXES:   deep tendon reflexes TRACE and symmetric  GAIT/STATION:   narrow based gait; SLIGHTLY ANTALGIC GAIT DUE TO LEFT WORSE THAN RIGHT KNEE PAIN    DIAGNOSTIC DATA (LABS, IMAGING, TESTING) - I reviewed patient records, labs, notes, testing and imaging myself where available.  Lab Results  Component Value Date   WBC 8.7 03/01/2017   HGB 13.6 03/01/2017   HCT 40.1 03/01/2017   MCV 80 03/01/2017   PLT 231 03/01/2017      Component Value Date/Time   NA 143 03/01/2017 1136   K 4.1 03/01/2017 1136   CL 100 03/01/2017 1136   CO2 26 03/01/2017 1136   GLUCOSE 83 03/01/2017 1136   GLUCOSE 111 (H) 02/17/2017 0224   BUN 14 03/01/2017 1136   CREATININE 0.82 03/01/2017 1136   CALCIUM 9.1 03/01/2017 1136   GFRNONAA 72 03/01/2017 1136   GFRAA 83 03/01/2017 1136   No results found for: CHOL, HDL, LDLCALC, LDLDIRECT, TRIG, CHOLHDL No results found for: HGBA1C No results found for: VITAMINB12 Lab Results  Component Value Date   TSH 4.332 02/14/2017    08/13/16 MRI brain (with and without) [I reviewed images myself and agree with interpretation. -VRP]  1. Mild periventricular and subcortical foci of chronic small vessel ischemic disease.  2. No acute findings.  08/30/16 EEG  - normal    ASSESSMENT AND PLAN  71 y.o. year old female here with new onset episode on 08/10/16 of transient vision changes, confusion with operating the television remote control, unprovoked loss of consciousness, tongue biting. Patient had a similar transient vision  change episode ~ April 2017, without the additional symptoms.  Findings are suspicious for partial onset seizure with occipital  lobe localization.  Now on empiric antiseizure medication with levetiracetam 500 mg twice a day, as this episode on 08/10/16 may have been the second event in her life.   Ddx: occipital seizure  1. Seizure disorder ([place])      PLAN:  I spent 25 minutes of face to face time with patient. Greater than 50% of time was spent in counseling and coordination of care with patient. In summary we discussed:    SEIZURE PREVENTION (established problem, stable) - continue levetiracetam 500mg  twice a day. - ok to continue driving; last seizure on 08/10/16.  - maintain seizure precautions. Caution with activities where a loss of awareness could harm you or someone else:  swimming alone, tub bathing, hot tubs, driving, operating motorized vehicles (cars, ATVs, motocycles, etc), lawnmowers or power tools. Caution with standing at heights, such as rooftops, ladders or stairs. Caution with hot objects such as stoves, heaters, open fires. Set your water heater to 120 degrees or less.  STROKE PREVENTION (established problem, stable) - regarding stroke prevention, advised on treatment of risk factors, including atrial fibrillation, hyperlipidemia, hypertension; may consider sleep study in future (due to obesity, HTN, snoring)  Meds ordered this encounter  Medications  . levETIRAcetam (KEPPRA) 500 MG tablet    Sig: Take 1 tablet (500 mg total) by mouth 2 (two) times daily.    Dispense:  180 tablet    Refill:  4   Return in about 1 year (around 04/01/2018).    Penni Bombard, MD 10/30/1094, 0:45 AM Certified in Neurology, Neurophysiology and Neuroimaging  [facility] [address] [place], [place] [address] [phone]

## 2017-04-19 DIAGNOSIS — G479 Sleep disorder, unspecified: Secondary | ICD-10-CM | POA: Diagnosis not present

## 2017-04-19 DIAGNOSIS — F339 Major depressive disorder, recurrent, unspecified: Secondary | ICD-10-CM | POA: Diagnosis not present

## 2017-04-19 DIAGNOSIS — E7800 Pure hypercholesterolemia, unspecified: Secondary | ICD-10-CM | POA: Diagnosis not present

## 2017-04-19 DIAGNOSIS — E559 Vitamin D deficiency, unspecified: Secondary | ICD-10-CM | POA: Diagnosis not present

## 2017-04-19 DIAGNOSIS — R5383 Other fatigue: Secondary | ICD-10-CM | POA: Diagnosis not present

## 2017-04-19 DIAGNOSIS — Z0000 Encounter for general adult medical examination without abnormal findings: Secondary | ICD-10-CM | POA: Diagnosis not present

## 2017-04-19 DIAGNOSIS — Z1389 Encounter for screening for other disorder: Secondary | ICD-10-CM | POA: Diagnosis not present

## 2017-04-19 DIAGNOSIS — G40909 Epilepsy, unspecified, not intractable, without status epilepticus: Secondary | ICD-10-CM | POA: Diagnosis not present

## 2017-04-19 DIAGNOSIS — Z6841 Body Mass Index (BMI) 40.0 and over, adult: Secondary | ICD-10-CM | POA: Diagnosis not present

## 2017-04-19 DIAGNOSIS — I481 Persistent atrial fibrillation: Secondary | ICD-10-CM | POA: Diagnosis not present

## 2017-04-19 DIAGNOSIS — I10 Essential (primary) hypertension: Secondary | ICD-10-CM | POA: Diagnosis not present

## 2017-05-10 ENCOUNTER — Encounter: Payer: Self-pay | Admitting: Cardiology

## 2017-05-10 ENCOUNTER — Ambulatory Visit (INDEPENDENT_AMBULATORY_CARE_PROVIDER_SITE_OTHER): Payer: Medicare Other | Admitting: Cardiology

## 2017-05-10 VITALS — BP 142/98 | HR 54 | Ht 61.0 in | Wt 226.0 lb

## 2017-05-10 DIAGNOSIS — G40909 Epilepsy, unspecified, not intractable, without status epilepticus: Secondary | ICD-10-CM | POA: Diagnosis not present

## 2017-05-10 DIAGNOSIS — E7800 Pure hypercholesterolemia, unspecified: Secondary | ICD-10-CM

## 2017-05-10 DIAGNOSIS — I4891 Unspecified atrial fibrillation: Secondary | ICD-10-CM

## 2017-05-10 NOTE — Patient Instructions (Signed)
Medication Instructions:  The current medical regimen is effective;  continue present plan and medications.  Testing/Procedures: Your physician has requested that you have an echocardiogram. Echocardiography is a painless test that uses sound waves to create images of your heart. It provides your doctor with information about the size and shape of your heart and how well your heart's chambers and valves are working. This procedure takes approximately one hour. There are no restrictions for this procedure.  Follow-Up: Follow up in 6 months with Cecilie Kicks.  You will receive a letter in the mail 2 months before you are due.  Please call us when you receive this letter to schedule your follow up appointment.  If you need a refill on your cardiac medications before your next appointment, please call your pharmacy.  Thank you for choosing [facility]!!

## 2017-05-10 NOTE — Progress Notes (Addendum)
Cardiology Office Note:    Date:  05/18/2017   ID:  Ashley Pacheco, DOB 15-Oct-1946, MRN [id number]  PCP:  Leighton Ruff, MD  Cardiologist:  Candee Furbish, MD    Referring MD: Leighton Ruff, MD     History of Present Illness:    Ashley Pacheco is a 71 y.o. female with a hx of Paroxysmal atrial fibrillation with hospitalization on 02/14/17 with TEE cardioversion. TEE Echocardiogram EF 40%, may have been related to her tachycardia. Back in 2015 her echo showed normal EF and stress test was normal. She also has comorbidities of morbid obesity, seizure disorder, hypertension, hyperlipidemia.  Previous lengthy discussion about atrial fibrillation and awareness of symptoms. She did not realize that she was in atrial fibrillation at the time, asymptomatic.  She's tired. Knee pain. Going to [place] in Aug.   Past Medical History:  Diagnosis Date  . 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 Atypical chest pain    a. 2015 Neg MV.  . Cardiomegaly 2005  . Cardiomyopathy ([place])    a. 01/2017 Echo: EF 45%, diff HK, mild LVH, triv AI, mild MR, mod dil LA, mildly dil RA, PASP 76mmHg - in setting of Afib.  [name] DVT (deep venous thrombosis) ([place]) 02/2004  . Fibroid   . Hypercholesteremia   . Hypertension   . Menometrorrhagia 3/92   menopausal  . Microhematuria 1/96  . Nephrolithiasis 2015  . PAF (paroxysmal atrial fibrillation) ([place])    a. 01/2017 TEE/DCCV: EF 45-50%, no LA/LAA thrombus-->successful DCCV @ 150 J. CHA2DS2VASc = 3-->Xarelto.  . Seizures ([place])    followed by [facility]- Dr. Leta Baptist  . SUI (stress urinary incontinence, female) 9/91  . Vulvar cyst 7/90    Past Surgical History:  Procedure Laterality Date  . APPENDECTOMY  1950  . BUNIONECTOMY  7/11   second toe  . CARDIOVERSION N/A 02/17/2017   Procedure: CARDIOVERSION;  Surgeon: Skeet Latch, MD;  Location: [place];  Service: Cardiovascular;  Laterality: N/A;  . cataracts  3/09 4/09  . DILATION AND CURETTAGE OF  UTERUS     hystreroscopy/imcomplete polypectomy  . KNEE SURGERY  10/13 or 11/13   right knee arthroscopy  . TEE WITHOUT CARDIOVERSION N/A 02/17/2017   Procedure: TRANSESOPHAGEAL ECHOCARDIOGRAM (TEE);  Surgeon: Skeet Latch, MD;  Location: [place];  Service: Cardiovascular;  Laterality: N/A;  . [place]  . TUBAL LIGATION  1975   BTL    Current Medications: Current Meds  Medication Sig  . acetaminophen (TYLENOL 8 HOUR) 650 MG CR tablet Take 650 mg by mouth as needed.   [name] aspirin 81 MG tablet Take 81 mg by mouth daily.  . Cholecalciferol (VITAMIN D) 2000 UNITS tablet Take 2,000 Units by mouth 2 (two) times daily.   [name] diltiazem (CARDIZEM CD) 240 MG 24 hr capsule Take 1 capsule (240 mg total) by mouth daily.  . diphenhydrAMINE (BENADRYL ALLERGY) 25 MG tablet Take 25 mg by mouth as needed (allergies).   . furosemide (LASIX) 40 MG tablet Take 1 tablet (40 mg total) by mouth daily.  [name] levETIRAcetam (KEPPRA) 500 MG tablet Take 1 tablet (500 mg total) by mouth 2 (two) times daily.  [name] lovastatin (MEVACOR) 40 MG tablet Take 40 mg by mouth at bedtime.   . metoprolol succinate (TOPROL XL) 25 MG 24 hr tablet Take 1 tablet (25 mg total) by mouth daily.  . Multiple Vitamin (MULTI-VITAMIN DAILY PO) Take 1 tablet by mouth daily.   [name]  rivaroxaban (XARELTO) 20 MG TABS tablet Take 1 tablet (20 mg total) by mouth daily with supper.  . sertraline (ZOLOFT) 100 MG tablet Take 100 mg by mouth daily.  . traZODone (DESYREL) 150 MG tablet Take 150 mg by mouth at bedtime.      Allergies:   Adhesive [tape]   Social History   Social History  . Marital status: Widowed    Spouse name: N/A  . Number of children: 2  . Years of education: 16   Occupational History  .      volunteer   Social History Main Topics  . Smoking status: Former Smoker    Types: Cigarettes  . Smokeless tobacco: Never Used     Comment: quit in 1969  . Alcohol use 4.2 oz/week    7 Glasses of wine  per week     Comment: 1 glass of wine with dinner most nights  . Drug use: No  . Sexual activity: No   Other Topics Concern  . None   Social History Narrative   Lives alone   Caffeine- coffee- 3 mugs daily     Family History: The patient's family history includes Asthma in her mother; Cancer in her father and paternal uncle; Heart attack in her maternal uncle; Heart disease in her brother and sister; Hypertension in her maternal uncle and mother; Kidney Stones in her father and mother; Skin cancer in her brother and sister; Stroke in her maternal grandfather and maternal uncle. ROS:   Please see the history of present illness.     All other systems reviewed and are negative.  EKGs/Labs/Other Studies Reviewed:    The following studies were reviewed today: Prior hospital records, office notes, lab work, echocardiogram, EKG  EKG:  03/01/17 Sinus bradycardia 57 with no other abnormalities.  Recent Labs: 02/14/2017: B Natriuretic Peptide 416.6; Magnesium 1.9; TSH 4.332 03/01/2017: BUN 14; Creatinine, Ser 0.82; Hemoglobin 13.6; Platelets 231; Potassium 4.1; Sodium 143  Recent Lipid Panel No results found for: CHOL, TRIG, HDL, CHOLHDL, VLDL, LDLCALC, LDLDIRECT  Physical Exam:    VS:  BP (!) 142/98   Pulse (!) 54   Ht 5\' 1"  (1.549 m)   Wt 226 lb (102.5 kg)   LMP 10/26/1999   BMI 42.70 kg/m     Wt Readings from Last 3 Encounters:  05/10/17 226 lb (102.5 kg)  04/01/17 227 lb 6.4 oz (103.1 kg)  03/01/17 223 lb 6.4 oz (101.3 kg)     GEN:  Well nourished, well developed in no acute distress, Obese HEENT: Normal NECK: No JVD; No carotid bruits LYMPHATICS: No lymphadenopathy CARDIAC: RRR, 2/6 systolic murmur RUSB, no rubs, gallops RESPIRATORY:  Clear to auscultation without rales, wheezing or rhonchi  ABDOMEN: Soft, non-tender, non-distended MUSCULOSKELETAL:  No edema; No deformity  SKIN: Warm and dry NEUROLOGIC:  Alert and oriented x 3 PSYCHIATRIC:  Normal affect    ASSESSMENT:    1. Atrial fibrillation with rapid ventricular response (HCC)   2. Seizure disorder ([place])   3. Morbid obesity ([place])   4. Pure hypercholesterolemia    PLAN:    In order of problems listed above:  Paroxysmal atrial fibrillation  - Post TEE cardioversion in April 2018, successful.  - Asymptomatic  - Continuing with anticoagulation, Xarelto  - CHADS-VASC 3 (F, HTN, Age)  - Main risk factors are age, obesity  - Repeat ECHO. Some fatigue may be EF related but we encouraged conditioning, weight loss, continued movement, exercise.  Morbid obesity  - Continue  to encourage weight loss  - Lasix was used for edema previously and help her lose some weight.  - Cane for ambulation  - some tingling in legs occasionally. Knee pain. I encouraged her to sparingly use Aleve secondary to bleeding risk.   Essential hypertension  - Medications reviewed, stable. Just took meds. Diastolic is high.   Hyper lipidemia  - Continue with statin use.   Seizure disorder  - Recently seen neurology, Dr. Leta Baptist  - Keppra     Medication Adjustments/Labs and Tests Ordered: Current medicines are reviewed at length with the patient today.  Concerns regarding medicines are outlined above.  Orders Placed This Encounter  Procedures  . ECHOCARDIOGRAM COMPLETE   No orders of the defined types were placed in this encounter.  ECHO repeat: 05/16/17 - Left ventricle: The cavity size was mildly dilated. Wall   thickness was normal. Systolic function was mildly to moderately   reduced. The estimated ejection fraction was in the range of 40%   to 45%. Diffuse hypokinesis. Features are consistent with a   pseudonormal left ventricular filling pattern, with concomitant   abnormal relaxation and increased filling pressure (grade 2   diastolic dysfunction). - Aortic valve: Transvalvular velocity was increased less than   expected, due to low cardiac output. There was mild to moderate   stenosis.  There was trivial regurgitation. Valve area (VTI): 1.54   cm^2. Valve area (Vmean): 1.5 cm^2. - Mitral valve: There was moderate regurgitation directed   centrally. - Left atrium: The atrium was moderately to severely dilated. - Pulmonary arteries: Systolic pressure was mildly increased. PA   peak pressure: 36 mm Hg (S).  EF same with non hemodynamically significant aortic stenosis and mitral regurgitation.  Continue with conditioning.  Will repeat ECHO in one year.   Signed, Candee Furbish, MD  05/18/2017 6:14 AM    [place]

## 2017-05-16 ENCOUNTER — Other Ambulatory Visit: Payer: Self-pay

## 2017-05-16 ENCOUNTER — Ambulatory Visit (HOSPITAL_COMMUNITY): Payer: Medicare Other | Attending: Cardiovascular Disease

## 2017-05-16 DIAGNOSIS — I5030 Unspecified diastolic (congestive) heart failure: Secondary | ICD-10-CM | POA: Diagnosis not present

## 2017-05-16 DIAGNOSIS — I080 Rheumatic disorders of both mitral and aortic valves: Secondary | ICD-10-CM | POA: Insufficient documentation

## 2017-05-16 DIAGNOSIS — I4891 Unspecified atrial fibrillation: Secondary | ICD-10-CM | POA: Diagnosis not present

## 2017-05-16 DIAGNOSIS — I420 Dilated cardiomyopathy: Secondary | ICD-10-CM | POA: Diagnosis not present

## 2017-05-18 ENCOUNTER — Telehealth: Payer: Self-pay | Admitting: *Deleted

## 2017-05-18 DIAGNOSIS — I350 Nonrheumatic aortic (valve) stenosis: Secondary | ICD-10-CM

## 2017-05-18 DIAGNOSIS — I340 Nonrheumatic mitral (valve) insufficiency: Secondary | ICD-10-CM

## 2017-05-18 HISTORY — DX: Nonrheumatic mitral (valve) insufficiency: I34.0

## 2017-05-18 HISTORY — DX: Nonrheumatic aortic (valve) stenosis: I35.0

## 2017-05-18 NOTE — Telephone Encounter (Signed)
Notified the pt of her echo results per Dr. Marlou Porch.  Informed the pt that per Dr. Marlou Porch, he recommends that she continue conditioning. Informed the pt that Dr. Marlou Porch recommends that we repeat another echo in one year.  Informed the pt that I will place the order for the echo in the system, and have a [facility] scheduler call her back, to arrange her echo for one year out.  Pt verbalized understanding and agrees with this plan.

## 2017-05-18 NOTE — Telephone Encounter (Signed)
-----   Message from Jerline Pain, MD sent at 05/18/2017  6:17 AM EDT ----- EF same with non hemodynamically significant aortic stenosis and mitral regurgitation.  Continue with conditioning.  Will repeat ECHO in one year.  Candee Furbish, MD

## 2017-07-05 ENCOUNTER — Other Ambulatory Visit: Payer: Self-pay | Admitting: Obstetrics & Gynecology

## 2017-07-05 DIAGNOSIS — Z853 Personal history of malignant neoplasm of breast: Secondary | ICD-10-CM

## 2017-07-18 ENCOUNTER — Other Ambulatory Visit: Payer: Self-pay | Admitting: Obstetrics & Gynecology

## 2017-07-18 DIAGNOSIS — E2839 Other primary ovarian failure: Secondary | ICD-10-CM

## 2017-07-20 ENCOUNTER — Other Ambulatory Visit: Payer: Self-pay | Admitting: Obstetrics & Gynecology

## 2017-07-20 ENCOUNTER — Ambulatory Visit
Admission: RE | Admit: 2017-07-20 | Discharge: 2017-07-20 | Disposition: A | Discharge status: Home / Self Care | Payer: Medicare Other | Source: Ambulatory Visit | Attending: Obstetrics & Gynecology | Admitting: Obstetrics & Gynecology

## 2017-07-20 ENCOUNTER — Other Ambulatory Visit: Payer: Medicare Other

## 2017-07-20 DIAGNOSIS — Z853 Personal history of malignant neoplasm of breast: Secondary | ICD-10-CM

## 2017-07-20 DIAGNOSIS — Z1231 Encounter for screening mammogram for malignant neoplasm of breast: Secondary | ICD-10-CM | POA: Diagnosis not present

## 2017-07-20 DIAGNOSIS — E2839 Other primary ovarian failure: Secondary | ICD-10-CM

## 2017-07-20 DIAGNOSIS — Z1239 Encounter for other screening for malignant neoplasm of breast: Secondary | ICD-10-CM

## 2017-07-20 DIAGNOSIS — M85851 Other specified disorders of bone density and structure, right thigh: Secondary | ICD-10-CM | POA: Diagnosis not present

## 2017-07-20 DIAGNOSIS — Z780 Asymptomatic menopausal state: Secondary | ICD-10-CM | POA: Diagnosis not present

## 2017-08-24 ENCOUNTER — Other Ambulatory Visit: Payer: Self-pay | Admitting: *Deleted

## 2017-08-24 ENCOUNTER — Telehealth: Payer: Self-pay | Admitting: Cardiology

## 2017-08-24 MED ORDER — DILTIAZEM HCL ER COATED BEADS 240 MG PO CP24
240.0000 mg | ORAL_CAPSULE | Freq: Every day | ORAL | 1 refills | Status: DC
Start: 2017-08-24 — End: 2017-11-19

## 2017-08-24 MED ORDER — FUROSEMIDE 40 MG PO TABS: 40.0000 mg | ORAL_TABLET | Freq: Every day | ORAL | 1 refills | Status: DC

## 2017-08-24 MED ORDER — METOPROLOL SUCCINATE ER 25 MG PO TB24: 25.0000 mg | ORAL_TABLET | Freq: Every day | ORAL | 1 refills | Status: DC

## 2017-08-24 NOTE — Telephone Encounter (Signed)
Patient requested that rx be changed to a ninety day supply.

## 2017-08-24 NOTE — Telephone Encounter (Signed)
New Message  Pt call requesting to get all 4 of her current prescriptions switched from the 30 day to the 90 day supply.pt does not know the names of the medication currently. Please call back to discuss

## 2017-08-24 NOTE — Telephone Encounter (Signed)
Spoke with patient and she stated that she did know the names of the medications that she needed and she did not understand why the message was sent to me like that. What she did not know was the provider that prescribed them as per patient is was a hospital provider. She read off the medications that she needed and she is aware that I will send them to her pharmacy for ninety day supplies. I apologized for the miscommunication from scheduler. Patient appreciative for my call.

## 2017-08-25 MED ORDER — RIVAROXABAN 20 MG PO TABS: 20.0000 mg | ORAL_TABLET | Freq: Every day | ORAL | 6 refills | Status: DC

## 2017-08-25 NOTE — Telephone Encounter (Signed)
Pt last saw Dr Theodosia Quay 05/10/17, last labs 03/01/17 Creat 0.82, age 71, weight 102.5kg, CrCl 100.35, based on CrCl pt is on appropriate dosage of Xarelto 20mg  QD.  Will refill rx.

## 2017-09-29 DIAGNOSIS — M1711 Unilateral primary osteoarthritis, right knee: Secondary | ICD-10-CM | POA: Diagnosis not present

## 2017-09-29 DIAGNOSIS — M1712 Unilateral primary osteoarthritis, left knee: Secondary | ICD-10-CM | POA: Diagnosis not present

## 2017-11-03 ENCOUNTER — Ambulatory Visit (INDEPENDENT_AMBULATORY_CARE_PROVIDER_SITE_OTHER): Payer: Medicare Other | Admitting: Cardiology

## 2017-11-03 ENCOUNTER — Encounter: Payer: Self-pay | Admitting: Cardiology

## 2017-11-03 VITALS — BP 100/66 | HR 72 | Ht 61.0 in | Wt 234.0 lb

## 2017-11-03 DIAGNOSIS — I208 Other forms of angina pectoris: Secondary | ICD-10-CM | POA: Diagnosis not present

## 2017-11-03 DIAGNOSIS — I209 Angina pectoris, unspecified: Secondary | ICD-10-CM

## 2017-11-03 DIAGNOSIS — I481 Persistent atrial fibrillation: Secondary | ICD-10-CM

## 2017-11-03 DIAGNOSIS — I4819 Other persistent atrial fibrillation: Secondary | ICD-10-CM

## 2017-11-03 NOTE — Patient Instructions (Signed)
Medication Instructions:  The current medical regimen is effective;  continue present plan and medications.  Testing/Procedures: Your physician has requested that you have a lexiscan myoview. For further information please visit [email or web address]. Please follow instruction sheet, as given.  Follow-Up: Follow up in 6 months with Dr. Marlou Porch.  You will receive a letter in the mail 2 months before you are due.  Please call us when you receive this letter to schedule your follow up appointment.  If you need a refill on your cardiac medications before your next appointment, please call your pharmacy.  Thank you for choosing [facility]!!

## 2017-11-03 NOTE — Progress Notes (Signed)
Cardiology Office Note:    Date:  11/03/2017   ID:  Ashley Pacheco, DOB January 05, 1946, MRN [id number]  PCP:  Leighton Ruff, MD  Cardiologist:  Candee Furbish, MD    Referring MD: Leighton Ruff, MD     History of Present Illness:    Ashley Pacheco is a 72 y.o. female with a hx of Paroxysmal atrial fibrillation with hospitalization on 02/14/17 with TEE cardioversion. TEE Echocardiogram EF 40%, may have been related to her tachycardia. Back in 2015 her echo showed normal EF and stress test was normal. She also has comorbidities of morbid obesity, seizure disorder, hypertension, hyperlipidemia.  Previous lengthy discussion about atrial fibrillation and awareness of symptoms. She did not realize that she was in atrial fibrillation at the time, asymptomatic.  11/03/17-during this visit, she was once again found to be in atrial fibrillation.  Previously successful cardioversion in April.  Asymptomatic once again.  We will continue with rate control.  Her EF was mildly reduced previously 40-45%.  She is on low-dose beta-blocker.  Also on medium dose diltiazem to help with control.  Denies any syncope bleeding orthopnea PND  Past Medical History:  Diagnosis Date  . 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 Atypical chest pain    a. 2015 Neg MV.  . Cardiomegaly 2005  . Cardiomyopathy ([place])    a. 01/2017 Echo: EF 45%, diff HK, mild LVH, triv AI, mild MR, mod dil LA, mildly dil RA, PASP 40mmHg - in setting of Afib.  [name] DVT (deep venous thrombosis) ([place]) 02/2004  . Fibroid   . Hypercholesteremia   . Hypertension   . Menometrorrhagia 3/92   menopausal  . Microhematuria 1/96  . Nephrolithiasis 2015  . PAF (paroxysmal atrial fibrillation) ([place])    a. 01/2017 TEE/DCCV: EF 45-50%, no LA/LAA thrombus-->successful DCCV @ 150 J. CHA2DS2VASc = 3-->Xarelto.  . Seizures ([place])    followed by [facility]- Dr. Leta Baptist  . SUI (stress urinary incontinence, female) 9/91  . Vulvar cyst 7/90    Past Surgical  History:  Procedure Laterality Date  . APPENDECTOMY  1950  . BUNIONECTOMY  7/11   second toe  . CARDIOVERSION N/A 02/17/2017   Procedure: CARDIOVERSION;  Surgeon: Skeet Latch, MD;  Location: [place];  Service: Cardiovascular;  Laterality: N/A;  . cataracts  3/09 4/09  . DILATION AND CURETTAGE OF UTERUS     hystreroscopy/imcomplete polypectomy  . KNEE SURGERY  10/13 or 11/13   right knee arthroscopy  . TEE WITHOUT CARDIOVERSION N/A 02/17/2017   Procedure: TRANSESOPHAGEAL ECHOCARDIOGRAM (TEE);  Surgeon: Skeet Latch, MD;  Location: [place];  Service: Cardiovascular;  Laterality: N/A;  . [place]  . TUBAL LIGATION  1975   BTL    Current Medications: Current Meds  Medication Sig  . acetaminophen (TYLENOL 8 HOUR) 650 MG CR tablet Take 650 mg by mouth as needed.   [name] aspirin 81 MG tablet Take 81 mg by mouth daily.  . Cholecalciferol (VITAMIN D) 2000 UNITS tablet Take 2,000 Units by mouth 2 (two) times daily.   [name] diltiazem (CARDIZEM CD) 240 MG 24 hr capsule Take 1 capsule (240 mg total) by mouth daily.  . diphenhydrAMINE (BENADRYL ALLERGY) 25 MG tablet Take 25 mg by mouth as needed (allergies).   . ferrous sulfate 325 (65 FE) MG tablet Take 325 mg by mouth daily with breakfast.  . furosemide (LASIX) 40 MG tablet Take 1 tablet (40 mg total) by mouth daily.  [name] levETIRAcetam (  KEPPRA) 500 MG tablet Take 1 tablet (500 mg total) by mouth 2 (two) times daily.  [name] lovastatin (MEVACOR) 40 MG tablet Take 40 mg by mouth at bedtime.   . metoprolol succinate (TOPROL XL) 25 MG 24 hr tablet Take 1 tablet (25 mg total) by mouth daily.  . Multiple Vitamin (MULTI-VITAMIN DAILY PO) Take 1 tablet by mouth daily.   . rivaroxaban (XARELTO) 20 MG TABS tablet Take 1 tablet (20 mg total) by mouth daily with supper.  . sertraline (ZOLOFT) 100 MG tablet Take 100 mg by mouth daily.  . traZODone (DESYREL) 150 MG tablet Take 150 mg by mouth at bedtime.      Allergies:    Adhesive [tape]   Social History   Socioeconomic History  . Marital status: Widowed    Spouse name: None  . Number of children: 2  . Years of education: 18  . Highest education level: None  Social Needs  . Financial resource strain: None  . Food insecurity - worry: None  . Food insecurity - inability: None  . Transportation needs - medical: None  . Transportation needs - non-medical: None  Occupational History    Comment: volunteer  Tobacco Use  . Smoking status: Former Smoker    Types: Cigarettes  . Smokeless tobacco: Never Used  . Tobacco comment: quit in 1969  Substance and Sexual Activity  . Alcohol use: Yes    Alcohol/week: 4.2 oz    Types: 7 Glasses of wine per week    Comment: 1 glass of wine with dinner most nights  . Drug use: No  . Sexual activity: No    Partners: Male  Other Topics Concern  . None  Social History Narrative   Lives alone   Caffeine- coffee- 3 mugs daily     Family History: The patient's family history includes Asthma in her mother; Cancer in her father and paternal uncle; Heart attack in her maternal uncle; Heart disease in her brother and sister; Hypertension in her maternal uncle and mother; Kidney Stones in her father and mother; Skin cancer in her brother and sister; Stroke in her maternal grandfather and maternal uncle. ROS:   Please see the history of present illness.     All other systems reviewed and are negative.  EKGs/Labs/Other Studies Reviewed:    The following studies were reviewed today: Prior hospital records, office notes, lab work, echocardiogram, EKG  EKG:  03/01/17 Sinus bradycardia 57 with no other abnormalities.  Recent Labs: 02/14/2017: B Natriuretic Peptide 416.6; Magnesium 1.9; TSH 4.332 03/01/2017: BUN 14; Creatinine, Ser 0.82; Hemoglobin 13.6; Platelets 231; Potassium 4.1; Sodium 143  Recent Lipid Panel No results found for: CHOL, TRIG, HDL, CHOLHDL, VLDL, LDLCALC, LDLDIRECT  Physical Exam:    VS:  BP 100/66    Pulse 72   Ht 5\' 1"  (1.549 m)   Wt 234 lb (106.1 kg)   LMP 10/26/1999   SpO2 97%   BMI 44.21 kg/m     Wt Readings from Last 3 Encounters:  11/03/17 234 lb (106.1 kg)  05/10/17 226 lb (102.5 kg)  04/01/17 227 lb 6.4 oz (103.1 kg)     GEN: Obesity, Well nourished, well developed, in no acute distress  HEENT: normal  Neck: no JVD, carotid bruits, or masses Cardiac: irreg; 1/6 SM, no rubs, or gallops,no edema  Respiratory:  clear to auscultation bilaterally, normal work of breathing GI: soft, nontender, nondistended, + BS MS: no deformity or atrophy  Skin: warm and dry, no rash  Neuro:  Alert and Oriented x 3, Strength and sensation are intact Psych: euthymic mood, full affect   ASSESSMENT:    1. Other forms of angina pectoris ([place])   2. Morbid obesity ([place])   3. Persistent atrial fibrillation ([place])    PLAN:    In order of problems listed above:  Angina -She did have some subscapular discomfort as well as left arm discomfort.  Potential angina.  We will check a nuclear stress test.  Back in 2015 this was low risk.  Persistent atrial fibrillation/mildly reduced ejection fraction 40-45%  - Post TEE cardioversion in April 2018, successful.  She was asymptomatic previously may be had some mild ankle edema prior to the discovery of atrial fibrillation.  She now has atrial fibrillation once again.  Unsure how long she has been in it.  We discussed her risk factors for atrial fibrillation and since she is asymptomatic we will continue with rate control strategy.  We discussed other potential treatment options for rhythm control.  - Continuing with anticoagulation, Xarelto  - CHADS-VASC 3 (F, HTN, Age)  - Main risk factors are age, obesity  - Repeat ECHO in July. Some fatigue may be EF related but we encouraged conditioning, weight loss, continued movement, exercise.  -Continue with low-dose Toprol as well.  Blood pressure does not allow for further increase or ACE  inhibitor.  Morbid obesity  - Continue to encourage weight loss  - Lasix was used for edema previously and help her lose some weight.  - Cane for ambulation  - some tingling in legs occasionally. Knee pain. I encouraged her to sparingly use Aleve secondary to bleeding risk.  Continue to encourage weight loss.  Essential hypertension  - Medications reviewed, excellent blood pressure today.  No dizziness.  Seizure disorder  - neurology, Dr. Leta Baptist  - Keppra  6 month f/u   Medication Adjustments/Labs and Tests Ordered: Current medicines are reviewed at length with the patient today.  Concerns regarding medicines are outlined above.  Orders Placed This Encounter  Procedures  . Myocardial Perfusion Imaging   No orders of the defined types were placed in this encounter.    Signed, Candee Furbish, MD  11/03/2017 3:21 PM    [facility]

## 2017-11-04 ENCOUNTER — Telehealth (HOSPITAL_COMMUNITY): Payer: Self-pay | Admitting: *Deleted

## 2017-11-04 NOTE — Telephone Encounter (Signed)
Left message on voicemail per DPR in reference to upcoming appointment scheduled on 11/08/17 at 12:30 with detailed instructions given per Myocardial Perfusion Study Information Sheet for the test. LM to arrive 15 minutes early, and that it is imperative to arrive on time for appointment to keep from having the test rescheduled. If you need to cancel or reschedule your appointment, please call the office within 24 hours of your appointment. Failure to do so may result in a cancellation of your appointment, and a $50 no show fee. Phone number given for call back for any questions.

## 2017-11-08 ENCOUNTER — Ambulatory Visit (HOSPITAL_COMMUNITY): Payer: Medicare Other

## 2017-11-08 ENCOUNTER — Encounter (HOSPITAL_COMMUNITY): Payer: Self-pay

## 2017-11-09 ENCOUNTER — Telehealth: Payer: Self-pay | Admitting: Cardiology

## 2017-11-09 ENCOUNTER — Ambulatory Visit (HOSPITAL_COMMUNITY): Payer: Medicare Other

## 2017-11-09 NOTE — Telephone Encounter (Signed)
Please have her come back in to see me or APP to make sure that her atrial fibrillation is under good rate control.  He was significantly elevated and did not allow her to proceed with a stress test previously.  Candee Furbish, MD

## 2017-11-11 NOTE — Telephone Encounter (Signed)
appt for f/u was scheduled was Tuesday 1/22 at 9:30 am.  Pt aware to arrive early for check in.

## 2017-11-15 ENCOUNTER — Encounter: Payer: Self-pay | Admitting: Nurse Practitioner

## 2017-11-15 ENCOUNTER — Observation Stay: Admit: 2017-11-15 | Payer: Self-pay | Admitting: Cardiology

## 2017-11-15 ENCOUNTER — Ambulatory Visit (INDEPENDENT_AMBULATORY_CARE_PROVIDER_SITE_OTHER): Payer: Medicare Other | Admitting: Nurse Practitioner

## 2017-11-15 ENCOUNTER — Inpatient Hospital Stay (HOSPITAL_COMMUNITY)
Admission: AD | Admit: 2017-11-15 | Discharge: 2017-11-19 | DRG: 309 | Disposition: A | Discharge status: Home / Self Care | Payer: Medicare Other | Source: Ambulatory Visit | Attending: Cardiology | Admitting: Cardiology

## 2017-11-15 VITALS — BP 110/60 | HR 130 | Ht 61.5 in | Wt 233.8 lb

## 2017-11-15 DIAGNOSIS — E7800 Pure hypercholesterolemia, unspecified: Secondary | ICD-10-CM | POA: Diagnosis present

## 2017-11-15 DIAGNOSIS — Z8249 Family history of ischemic heart disease and other diseases of the circulatory system: Secondary | ICD-10-CM

## 2017-11-15 DIAGNOSIS — Z823 Family history of stroke: Secondary | ICD-10-CM

## 2017-11-15 DIAGNOSIS — I481 Persistent atrial fibrillation: Secondary | ICD-10-CM | POA: Diagnosis present

## 2017-11-15 DIAGNOSIS — I420 Dilated cardiomyopathy: Secondary | ICD-10-CM | POA: Diagnosis present

## 2017-11-15 DIAGNOSIS — M19012 Primary osteoarthritis, left shoulder: Secondary | ICD-10-CM | POA: Diagnosis present

## 2017-11-15 DIAGNOSIS — Z79899 Other long term (current) drug therapy: Secondary | ICD-10-CM

## 2017-11-15 DIAGNOSIS — I4891 Unspecified atrial fibrillation: Secondary | ICD-10-CM | POA: Diagnosis not present

## 2017-11-15 DIAGNOSIS — I480 Paroxysmal atrial fibrillation: Principal | ICD-10-CM | POA: Diagnosis present

## 2017-11-15 DIAGNOSIS — Z7982 Long term (current) use of aspirin: Secondary | ICD-10-CM

## 2017-11-15 DIAGNOSIS — I080 Rheumatic disorders of both mitral and aortic valves: Secondary | ICD-10-CM | POA: Diagnosis present

## 2017-11-15 DIAGNOSIS — I110 Hypertensive heart disease with heart failure: Secondary | ICD-10-CM | POA: Diagnosis present

## 2017-11-15 DIAGNOSIS — Z808 Family history of malignant neoplasm of other organs or systems: Secondary | ICD-10-CM

## 2017-11-15 DIAGNOSIS — Z6841 Body Mass Index (BMI) 40.0 and over, adult: Secondary | ICD-10-CM

## 2017-11-15 DIAGNOSIS — Z86718 Personal history of other venous thrombosis and embolism: Secondary | ICD-10-CM

## 2017-11-15 DIAGNOSIS — Z7901 Long term (current) use of anticoagulants: Secondary | ICD-10-CM

## 2017-11-15 DIAGNOSIS — I5042 Chronic combined systolic (congestive) and diastolic (congestive) heart failure: Secondary | ICD-10-CM | POA: Diagnosis present

## 2017-11-15 DIAGNOSIS — Z87891 Personal history of nicotine dependence: Secondary | ICD-10-CM

## 2017-11-15 DIAGNOSIS — Z825 Family history of asthma and other chronic lower respiratory diseases: Secondary | ICD-10-CM

## 2017-11-15 DIAGNOSIS — M1710 Unilateral primary osteoarthritis, unspecified knee: Secondary | ICD-10-CM | POA: Diagnosis present

## 2017-11-15 DIAGNOSIS — R079 Chest pain, unspecified: Secondary | ICD-10-CM

## 2017-11-15 DIAGNOSIS — E785 Hyperlipidemia, unspecified: Secondary | ICD-10-CM | POA: Diagnosis present

## 2017-11-15 DIAGNOSIS — G40909 Epilepsy, unspecified, not intractable, without status epilepticus: Secondary | ICD-10-CM | POA: Diagnosis present

## 2017-11-15 HISTORY — DX: Unspecified atrial fibrillation: I48.91

## 2017-11-15 LAB — CBC WITH DIFFERENTIAL/PLATELET
Basophils Absolute: 0 10*3/uL (ref 0.0–0.1)
Basophils Relative: 0 %
Eosinophils Absolute: 0.1 10*3/uL (ref 0.0–0.7)
Eosinophils Relative: 1 %
HCT: 39.2 % (ref 36.0–46.0)
Hemoglobin: 12.8 g/dL (ref 12.0–15.0)
Lymphocytes Relative: 21 %
Lymphs Abs: 1.5 10*3/uL (ref 0.7–4.0)
MCH: 27.8 pg (ref 26.0–34.0)
MCHC: 32.7 g/dL (ref 30.0–36.0)
MCV: 85.2 fL (ref 78.0–100.0)
Monocytes Absolute: 0.6 10*3/uL (ref 0.1–1.0)
Monocytes Relative: 9 %
Neutro Abs: 4.9 10*3/uL (ref 1.7–7.7)
Neutrophils Relative %: 69 %
Platelets: 170 10*3/uL (ref 150–400)
RBC: 4.6 MIL/uL (ref 3.87–5.11)
RDW: 13.5 % (ref 11.5–15.5)
WBC: 7.1 10*3/uL (ref 4.0–10.5)

## 2017-11-15 LAB — APTT: aPTT: 50 seconds — ABNORMAL HIGH (ref 24–36)

## 2017-11-15 LAB — PROTIME-INR
INR: 3.13
Prothrombin Time: 32 seconds — ABNORMAL HIGH (ref 11.4–15.2)

## 2017-11-15 LAB — COMPREHENSIVE METABOLIC PANEL
ALT: 19 U/L (ref 14–54)
AST: 27 U/L (ref 15–41)
Albumin: 3.5 g/dL (ref 3.5–5.0)
Alkaline Phosphatase: 77 U/L (ref 38–126)
Anion gap: 12 (ref 5–15)
BUN: 12 mg/dL (ref 6–20)
CO2: 24 mmol/L (ref 22–32)
Calcium: 8.3 mg/dL — ABNORMAL LOW (ref 8.9–10.3)
Chloride: 108 mmol/L (ref 101–111)
Creatinine, Ser: 0.97 mg/dL (ref 0.44–1.00)
GFR calc Af Amer: >60 mL/min (ref >60)
GFR calc non Af Amer: 57 mL/min — ABNORMAL LOW (ref >60)
Glucose, Bld: 106 mg/dL — ABNORMAL HIGH (ref 65–99)
Potassium: 3.6 mmol/L (ref 3.5–5.1)
Sodium: 144 mmol/L (ref 135–145)
Total Bilirubin: 0.5 mg/dL (ref 0.3–1.2)
Total Protein: 6.1 g/dL — ABNORMAL LOW (ref 6.5–8.1)

## 2017-11-15 LAB — HEMOGLOBIN A1C
Hgb A1c MFr Bld: 5.5 % (ref 4.8–5.6)
Mean Plasma Glucose: 111.15 mg/dL

## 2017-11-15 LAB — TSH: TSH: 2.339 u[IU]/mL (ref 0.350–4.500)

## 2017-11-15 LAB — BRAIN NATRIURETIC PEPTIDE: B Natriuretic Peptide: 356.4 pg/mL — ABNORMAL HIGH (ref 0.0–100.0)

## 2017-11-15 LAB — TROPONIN I
Troponin I: <0.03 ng/mL (ref <0.03)
Troponin I: <0.03 ng/mL (ref <0.03)

## 2017-11-15 LAB — MAGNESIUM: Magnesium: 2 mg/dL (ref 1.7–2.4)

## 2017-11-15 MED ORDER — TRAZODONE HCL 50 MG PO TABS
150.0000 mg | ORAL_TABLET | Freq: Every day | ORAL | Status: DC
  Administered 2017-11-15 – 2017-11-18 (×4): 150 mg via ORAL
  Filled 2017-11-15 (×3): qty 3
  Filled 2017-11-15: qty 6

## 2017-11-15 MED ORDER — SODIUM CHLORIDE 0.9 % IV SOLN: 4.0000 mg | Freq: Four times a day (QID) | INTRAVENOUS | Status: DC | PRN

## 2017-11-15 MED ORDER — ASPIRIN EC 81 MG PO TBEC: 81.0000 mg | DELAYED_RELEASE_TABLET | Freq: Every day | ORAL | Status: DC

## 2017-11-15 MED ORDER — RIVAROXABAN 20 MG PO TABS
20.0000 mg | ORAL_TABLET | Freq: Every day | ORAL | Status: DC
  Administered 2017-11-16 – 2017-11-19 (×4): 20 mg via ORAL
  Filled 2017-11-15: qty 2
  Filled 2017-11-15 (×4): qty 1

## 2017-11-15 MED ORDER — METOPROLOL SUCCINATE 12.5 MG HALF TABLET: 25.0000 mg | ORAL_TABLET | Freq: Every day | ORAL | Status: DC

## 2017-11-15 MED ORDER — FERROUS SULFATE 325 (65 FE) MG PO TABS: 325.0000 mg | ORAL_TABLET | Freq: Every day | ORAL | Status: DC

## 2017-11-15 MED ORDER — LEVETIRACETAM 250 MG PO TABS: 500.0000 mg | ORAL_TABLET | Freq: Two times a day (BID) | ORAL | Status: DC

## 2017-11-15 MED ORDER — ASPIRIN EC 81 MG PO TBEC
81.0000 mg | DELAYED_RELEASE_TABLET | Freq: Every day | ORAL | Status: DC
  Administered 2017-11-16: 81 mg via ORAL
  Filled 2017-11-15: qty 1

## 2017-11-15 MED ORDER — FUROSEMIDE 20 MG PO TABS
40.0000 mg | ORAL_TABLET | Freq: Every day | ORAL | Status: DC
  Administered 2017-11-16: 40 mg via ORAL
  Filled 2017-11-15: qty 2

## 2017-11-15 MED ORDER — SERTRALINE HCL 100 MG PO TABS
100.0000 mg | ORAL_TABLET | Freq: Every day | ORAL | Status: DC
  Administered 2017-11-16 – 2017-11-19 (×4): 100 mg via ORAL
  Filled 2017-11-15: qty 1
  Filled 2017-11-15: qty 4
  Filled 2017-11-15: qty 1
  Filled 2017-11-15: qty 4
  Filled 2017-11-15: qty 1

## 2017-11-15 MED ORDER — SERTRALINE HCL 25 MG PO TABS: 100.0000 mg | ORAL_TABLET | Freq: Every day | ORAL | Status: DC

## 2017-11-15 MED ORDER — FERROUS SULFATE 325 (65 FE) MG PO TABS
325.0000 mg | ORAL_TABLET | Freq: Every day | ORAL | Status: DC
  Administered 2017-11-16: 325 mg via ORAL
  Filled 2017-11-15: qty 1

## 2017-11-15 MED ORDER — FUROSEMIDE 20 MG PO TABS: 40.0000 mg | ORAL_TABLET | Freq: Every day | ORAL | Status: DC

## 2017-11-15 MED ORDER — ONDANSETRON HCL 4 MG/2ML IJ SOLN: 4.0000 mg | Freq: Four times a day (QID) | INTRAMUSCULAR | Status: DC | PRN

## 2017-11-15 MED ORDER — PRAVASTATIN SODIUM 10 MG PO TABS: 20.0000 mg | ORAL_TABLET | Freq: Every day | ORAL | Status: DC

## 2017-11-15 MED ORDER — VITAMIN D 1000 UNITS PO TABS: 2000.0000 [IU] | ORAL_TABLET | Freq: Every day | ORAL | Status: DC

## 2017-11-15 MED ORDER — LEVETIRACETAM 500 MG PO TABS
500.0000 mg | ORAL_TABLET | Freq: Two times a day (BID) | ORAL | Status: DC
  Administered 2017-11-15 – 2017-11-19 (×8): 500 mg via ORAL
  Filled 2017-11-15: qty 2
  Filled 2017-11-15 (×6): qty 1
  Filled 2017-11-15: qty 2

## 2017-11-15 MED ORDER — TRAZODONE 25 MG HALF TABLET: 150.0000 mg | ORAL_TABLET | Freq: Every day | ORAL | Status: DC

## 2017-11-15 MED ORDER — ACETAMINOPHEN 325 MG PO TABS: 650.0000 mg | ORAL_TABLET | ORAL | Status: DC | PRN

## 2017-11-15 MED ORDER — ACETAMINOPHEN 325 MG PO TABS
650.0000 mg | ORAL_TABLET | ORAL | Status: DC | PRN
Start: 2017-11-15 — End: 2017-11-19

## 2017-11-15 MED ORDER — DILTIAZEM LOAD VIA INFUSION
20.0000 mg | Freq: Once | INTRAVENOUS | Status: AC
  Administered 2017-11-15: 20 mg via INTRAVENOUS
  Filled 2017-11-15: qty 20

## 2017-11-15 MED ORDER — METOPROLOL SUCCINATE ER 25 MG PO TB24
25.0000 mg | ORAL_TABLET | Freq: Every day | ORAL | Status: DC
  Administered 2017-11-16: 25 mg via ORAL
  Filled 2017-11-15: qty 1
  Filled 2017-11-15 (×2): qty 2

## 2017-11-15 MED ORDER — DILTIAZEM HCL 100 MG IV SOLR: 5.0000 mg/h | INTRAVENOUS | Status: DC

## 2017-11-15 MED ORDER — DILTIAZEM HCL 100 MG IV SOLR
5.0000 mg/h | INTRAVENOUS | Status: DC
  Administered 2017-11-16: 5 mg/h via INTRAVENOUS
  Filled 2017-11-15 (×2): qty 100

## 2017-11-15 MED ORDER — HYDROCODONE-ACETAMINOPHEN 5-325 MG PO TABS: 1.0000 | ORAL_TABLET | Freq: Four times a day (QID) | ORAL | Status: DC | PRN

## 2017-11-15 MED ORDER — RIVAROXABAN 10 MG PO TABS: 20.0000 mg | ORAL_TABLET | Freq: Every day | ORAL | Status: DC

## 2017-11-15 MED ORDER — PRAVASTATIN SODIUM 20 MG PO TABS
20.0000 mg | ORAL_TABLET | Freq: Every day | ORAL | Status: DC
  Administered 2017-11-16 – 2017-11-19 (×4): 20 mg via ORAL
  Filled 2017-11-15: qty 2
  Filled 2017-11-15 (×3): qty 1

## 2017-11-15 MED ORDER — DILTIAZEM LOAD VIA INFUSION: 20.0000 mg | Freq: Once | INTRAVENOUS | Status: DC

## 2017-11-15 NOTE — Progress Notes (Signed)
CARDIOLOGY OFFICE NOTE  Date:  11/15/2017    Ashley Pacheco Date of Birth: 1946/08/27 Medical Record #[id number]  PCP:  Leighton Ruff, MD  Cardiologist:  [facility]  Chief Complaint  Patient presents with  . Atrial Fibrillation    Work in visit - seen for Dr. Marlou Porch    History of Present Illness: Ashley Pacheco is a 72 y.o. female who presents today for a work in visit. Seen for Dr. Marlou Porch.   She has a history of PAF with hospitalization on 02/14/17 with TEE cardioversion. TEE Echocardiogram EF 40%, may have been related to her tachycardia. Back in 2015 her echo showed normal EF and stress test was normal. She also has comorbidities of morbid obesity, seizure disorder, hypertension, hyperlipidemia.  Noted that there have been prior lengthy discussion about atrial fibrillation and awareness of symptoms. She did not realize that she was in atrial fibrillation at the time, asymptomatic.  Seen earlier this month by Dr. Marlou Porch - found to be in atrial fib again by exam - opted to manage with rate control as she was not symptomatic. Had endorsed some chest pain - Myoview arranged but when she presented for the study - HR too fast to complete the test. Thus Dr. Marlou Porch wanted her to come back for recheck.   Comes in today. Here alone. HR remains up. She had left sided chest pain and left arm pain - difficult to sleep. This has just started over the last few days - different from the chest pain syndrome that she described to Dr. Marlou Porch earlier this month. No real awareness of her heart rate being fast. She does get winded with activity. No real worsening of swelling. BP soft.   Past Medical History:  Diagnosis Date  . 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 Atypical chest pain    a. 2015 Neg MV.  . Cardiomegaly 2005  . Cardiomyopathy ([place])    a. 01/2017 Echo: EF 45%, diff HK, mild LVH, triv AI, mild MR, mod dil LA, mildly dil RA, PASP 109mmHg - in setting of Afib.  [name] DVT (deep venous thrombosis)  ([place]) 02/2004  . Fibroid   . Hypercholesteremia   . Hypertension   . Menometrorrhagia 3/92   menopausal  . Microhematuria 1/96  . Nephrolithiasis 2015  . PAF (paroxysmal atrial fibrillation) ([place])    a. 01/2017 TEE/DCCV: EF 45-50%, no LA/LAA thrombus-->successful DCCV @ 150 J. CHA2DS2VASc = 3-->Xarelto.  . Seizures ([place])    followed by [facility] Neurological- Dr. Leta Baptist  . SUI (stress urinary incontinence, female) 9/91  . Vulvar cyst 7/90    Past Surgical History:  Procedure Laterality Date  . APPENDECTOMY  1950  . BUNIONECTOMY  7/11   second toe  . CARDIOVERSION N/A 02/17/2017   Procedure: CARDIOVERSION;  Surgeon: Skeet Latch, MD;  Location: [place];  Service: Cardiovascular;  Laterality: N/A;  . cataracts  3/09 4/09  . DILATION AND CURETTAGE OF UTERUS     hystreroscopy/imcomplete polypectomy  . KNEE SURGERY  10/13 or 11/13   right knee arthroscopy  . TEE WITHOUT CARDIOVERSION N/A 02/17/2017   Procedure: TRANSESOPHAGEAL ECHOCARDIOGRAM (TEE);  Surgeon: Skeet Latch, MD;  Location: [place];  Service: Cardiovascular;  Laterality: N/A;  . [place]  . TUBAL LIGATION  1975   BTL     Medications: Current Meds  Medication Sig  . acetaminophen (TYLENOL 8 HOUR) 650 MG CR tablet Take 650 mg by mouth as needed.   [name]  aspirin 81 MG tablet Take 81 mg by mouth daily.  . Cholecalciferol (VITAMIN D) 2000 UNITS tablet Take 2,000 Units by mouth 2 (two) times daily.   [name] diltiazem (CARDIZEM CD) 240 MG 24 hr capsule Take 1 capsule (240 mg total) by mouth daily.  . diphenhydrAMINE (BENADRYL ALLERGY) 25 MG tablet Take 25 mg by mouth as needed (allergies).   . ferrous sulfate 325 (65 FE) MG tablet Take 325 mg by mouth daily with breakfast.  . furosemide (LASIX) 40 MG tablet Take 1 tablet (40 mg total) by mouth daily.  [name] HYDROcodone-acetaminophen (NORCO/VICODIN) 5-325 MG tablet Take 1 tablet by mouth every 6 (six) hours as needed for moderate pain.  [name]  levETIRAcetam (KEPPRA) 500 MG tablet Take 1 tablet (500 mg total) by mouth 2 (two) times daily.  [name] loperamide (IMODIUM) 2 MG capsule Take 2 mg by mouth as needed for diarrhea or loose stools.  . lovastatin (MEVACOR) 40 MG tablet Take 40 mg by mouth at bedtime.   . metoprolol succinate (TOPROL XL) 25 MG 24 hr tablet Take 1 tablet (25 mg total) by mouth daily.  . Multiple Vitamin (MULTI-VITAMIN DAILY PO) Take 1 tablet by mouth daily.   . rivaroxaban (XARELTO) 20 MG TABS tablet Take 1 tablet (20 mg total) by mouth daily with supper.  . sertraline (ZOLOFT) 100 MG tablet Take 100 mg by mouth daily.  . traZODone (DESYREL) 150 MG tablet Take 150 mg by mouth at bedtime.      Allergies: Allergies  Allergen Reactions  . Adhesive [Tape]     rash    Social History: The patient  reports that she has quit smoking. Her smoking use included cigarettes. she has never used smokeless tobacco. She reports that she drinks about 4.2 oz of alcohol per week. She reports that she does not use drugs. Has not smoked in over 50 years.    Family History: The patient's family history includes Asthma in her mother; Cancer in her father and paternal uncle; Heart attack in her maternal uncle; Heart disease in her brother and sister; Hypertension in her maternal uncle and mother; Kidney Stones in her father and mother; Skin cancer in her brother and sister; Stroke in her maternal grandfather and maternal uncle. Mother's family + for stroke and heart attacks.   Review of Systems: Please see the history of present illness.   Otherwise, the review of systems is positive for none.   All other systems are reviewed and negative.   Physical Exam: VS:  BP 110/60 (BP Location: Left Arm, Patient Position: Sitting, Cuff Size: Large)   Pulse (!) 130   Ht 5' 1.5" (1.562 m)   Wt 233 lb 12.8 oz (106.1 kg)   LMP 10/26/1999   BMI 43.46 kg/m  .  BMI Body mass index is 43.46 kg/m.  Wt Readings from Last 3 Encounters:  11/15/17 233  lb 12.8 oz (106.1 kg)  11/08/17 234 lb (106.1 kg)  11/03/17 234 lb (106.1 kg)    General: Pleasant. Obese. Well developed, well nourished and in no acute distress.   HEENT: Normal.  Neck: Supple, no JVD, carotid bruits, or masses noted.  Cardiac: Irregular irregular rhythm. Rate is fast. No murmurs, rubs, or gallops. No edema.  Respiratory:  Lungs are clear to auscultation bilaterally with normal work of breathing.  GI: Soft and nontender.  MS: No deformity or atrophy. Gait and ROM intact.  Skin: Warm and dry. Color is normal.  Neuro:  Strength and sensation are  intact and no gross focal deficits noted.  Psych: Alert, appropriate and with normal affect.   LABORATORY DATA:  EKG:  EKG is ordered today. This demonstrates AF with RVR - HR is 130 today. Reviewed with Dr. Marlou Porch here in the office this morning.   Lab Results  Component Value Date   WBC 8.7 03/01/2017   HGB 13.6 03/01/2017   HCT 40.1 03/01/2017   PLT 231 03/01/2017   GLUCOSE 83 03/01/2017   NA 143 03/01/2017   K 4.1 03/01/2017   CL 100 03/01/2017   CREATININE 0.82 03/01/2017   BUN 14 03/01/2017   CO2 26 03/01/2017   TSH 4.332 02/14/2017   INR 1.15 02/14/2017     BNP (last 3 results) Recent Labs    02/14/17 1755  BNP 416.6*    ProBNP (last 3 results) No results for input(s): PROBNP in the last 8760 hours.   Other Studies Reviewed Today:  Echo Study Conclusions 04/2017  - Left ventricle: The cavity size was mildly dilated. Wall   thickness was normal. Systolic function was mildly to moderately   reduced. The estimated ejection fraction was in the range of 40%   to 45%. Diffuse hypokinesis. Features are consistent with a   pseudonormal left ventricular filling pattern, with concomitant   abnormal relaxation and increased filling pressure (grade 2   diastolic dysfunction). - Aortic valve: Transvalvular velocity was increased less than   expected, due to low cardiac output. There was mild to  moderate   stenosis. There was trivial regurgitation. Valve area (VTI): 1.54   cm^2. Valve area (Vmean): 1.5 cm^2. - Mitral valve: There was moderate regurgitation directed   centrally. - Left atrium: The atrium was moderately to severely dilated. - Pulmonary arteries: Systolic pressure was mildly increased. PA   peak pressure: 36 mm Hg (S).  Assessment/Plan:  1. AF with RVR - on good doses of CCB and low dose BB - BP is soft and limiting titration. Discussed with Dr. Marlou Porch. Will plan to admit, ask EP to see for consideration of AAD therapy +/- cardioversion (tentatively try for tomorrow). CHADS-VASC 3 (F, HTN, Age). Patient has been seen along with Dr. Marlou Porch here in the office and he is in agreement. Will try to rate control with IV Dilt. She reports no missed doses of Xarelto.   2. Chest pain/left arm pain - may be from inadequate rate control?? May be ischemic?? Further disposition to follow. Low risk Myoview from 2015 noted. Not able to perform most recent study that was ordered due to inadequate HR.    3. LV dysfunction - may be related to tachycardia - may be related to ischemia.   4. HTN - BP now soft and limiting titration of her medicines.   5. Seizure disorder - followed by neurology, Dr. Leta Baptist    Current medicines are reviewed with the patient today.  The patient does not have concerns regarding medicines other than what has been noted above.  The following changes have been made:  See above.  Labs/ tests ordered today include:    Orders Placed This Encounter  Procedures  . EKG 12-Lead     Disposition:   Further disposition pending. Patient to be admitted to [facility] today. EP to see. There are currently no beds available at this time. Dr. Marlou Porch has given the ok to let patient go home and be electively admitted later today. Will tentatively make her NPO after MN tonight for possible cardioversion tomorrow.    Patient  is agreeable to this plan and will call if any  problems develop in the interim.   SignedTruitt Merle, NP  11/15/2017 10:09 AM  [facility] [address] [place], [place]  [address] Phone: [phone] Fax: [phone]

## 2017-11-15 NOTE — H&P (Signed)
Author: Burtis Junes, NP Author Type: Nurse Practitioner Filed: 11/15/2017 10:22 AM  Note Status: Signed Cosign: Cosign Not Required Encounter Date: 11/15/2017  Editor: Burtis Junes, NP (Nurse Practitioner)  Expand All Collapse All       CARDIOLOGY OFFICE NOTE  Date:  11/15/2017    Ashley Pacheco Date of Birth: 1946/07/10 Medical Record #[id number]  PCP:  Leighton Ruff, MD  Cardiologist:  [facility]      Chief Complaint  Patient presents with  . Atrial Fibrillation    Work in visit - seen for Dr. Marlou Porch    History of Present Illness: Ashley Pacheco is a 72 y.o. female who presents today for a work in visit. Seen for Dr. Marlou Porch.   She has a history of PAF with hospitalization on 02/14/17 with TEE cardioversion. TEE Echocardiogram EF 40%, may have been related to her tachycardia. Back in 2015 her echo showed normal EF and stress test was normal. She also has comorbidities of morbid obesity, seizure disorder, hypertension, hyperlipidemia.  Noted that there have been prior lengthy discussion about atrial fibrillation and awareness of symptoms. She did not realize that she was in atrial fibrillation at the time, asymptomatic.  Seen earlier this month by Dr. Marlou Porch - found to be in atrial fib again by exam - opted to manage with rate control as she was not symptomatic. Had endorsed some chest pain - Myoview arranged but when she presented for the study - HR too fast to complete the test. Thus Dr. Marlou Porch wanted her to come back for recheck.   Comes in today. Here alone. HR remains up. She had left sided chest pain and left arm pain - difficult to sleep. This has just started over the last few days - different from the chest pain syndrome that she described to Dr. Marlou Porch earlier this month. No real awareness of her heart rate being fast. She does get winded with activity. No real worsening of swelling. BP soft.       Past Medical History:  Diagnosis Date  .  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 Atypical chest pain    a. 2015 Neg MV.  . Cardiomegaly 2005  . Cardiomyopathy ([place])    a. 01/2017 Echo: EF 45%, diff HK, mild LVH, triv AI, mild MR, mod dil LA, mildly dil RA, PASP 10mmHg - in setting of Afib.  [name] DVT (deep venous thrombosis) ([place]) 02/2004  . Fibroid   . Hypercholesteremia   . Hypertension   . Menometrorrhagia 3/92   menopausal  . Microhematuria 1/96  . Nephrolithiasis 2015  . PAF (paroxysmal atrial fibrillation) ([place])    a. 01/2017 TEE/DCCV: EF 45-50%, no LA/LAA thrombus-->successful DCCV @ 150 J. CHA2DS2VASc = 3-->Xarelto.  . Seizures ([place])    followed by [facility]- Dr. Leta Baptist  . SUI (stress urinary incontinence, female) 9/91  . Vulvar cyst 7/90         Past Surgical History:  Procedure Laterality Date  . APPENDECTOMY  1950  . BUNIONECTOMY  7/11   second toe  . CARDIOVERSION N/A 02/17/2017   Procedure: CARDIOVERSION;  Surgeon: Skeet Latch, MD;  Location: [place];  Service: Cardiovascular;  Laterality: N/A;  . cataracts  3/09 4/09  . DILATION AND CURETTAGE OF UTERUS     hystreroscopy/imcomplete polypectomy  . KNEE SURGERY  10/13 or 11/13   right knee arthroscopy  . TEE WITHOUT CARDIOVERSION N/A 02/17/2017   Procedure: TRANSESOPHAGEAL ECHOCARDIOGRAM (TEE);  Surgeon: Skeet Latch, MD;  Location: [place];  Service: Cardiovascular;  Laterality: N/A;  . [place]  . TUBAL LIGATION  1975   BTL     Medications: ActiveMedications      Current Meds  Medication Sig  . acetaminophen (TYLENOL 8 HOUR) 650 MG CR tablet Take 650 mg by mouth as needed.   [name] aspirin 81 MG tablet Take 81 mg by mouth daily.  . Cholecalciferol (VITAMIN D) 2000 UNITS tablet Take 2,000 Units by mouth 2 (two) times daily.   [name] diltiazem (CARDIZEM CD) 240 MG 24 hr capsule Take 1 capsule (240 mg total) by mouth daily.  . diphenhydrAMINE (BENADRYL ALLERGY) 25 MG tablet Take 25  mg by mouth as needed (allergies).   . ferrous sulfate 325 (65 FE) MG tablet Take 325 mg by mouth daily with breakfast.  . furosemide (LASIX) 40 MG tablet Take 1 tablet (40 mg total) by mouth daily.  [name] HYDROcodone-acetaminophen (NORCO/VICODIN) 5-325 MG tablet Take 1 tablet by mouth every 6 (six) hours as needed for moderate pain.  [name] levETIRAcetam (KEPPRA) 500 MG tablet Take 1 tablet (500 mg total) by mouth 2 (two) times daily.  [name] loperamide (IMODIUM) 2 MG capsule Take 2 mg by mouth as needed for diarrhea or loose stools.  . lovastatin (MEVACOR) 40 MG tablet Take 40 mg by mouth at bedtime.   . metoprolol succinate (TOPROL XL) 25 MG 24 hr tablet Take 1 tablet (25 mg total) by mouth daily.  . Multiple Vitamin (MULTI-VITAMIN DAILY PO) Take 1 tablet by mouth daily.   . rivaroxaban (XARELTO) 20 MG TABS tablet Take 1 tablet (20 mg total) by mouth daily with supper.  . sertraline (ZOLOFT) 100 MG tablet Take 100 mg by mouth daily.  . traZODone (DESYREL) 150 MG tablet Take 150 mg by mouth at bedtime.        Allergies:      Allergies  Allergen Reactions  . Adhesive [Tape]     rash    Social History: The patient  reports that she has quit smoking. Her smoking use included cigarettes. she has never used smokeless tobacco. She reports that she drinks about 4.2 oz of alcohol per week. She reports that she does not use drugs. Has not smoked in over 50 years.    Family History: The patient's family history includes Asthma in her mother; Cancer in her father and paternal uncle; Heart attack in her maternal uncle; Heart disease in her brother and sister; Hypertension in her maternal uncle and mother; Kidney Stones in her father and mother; Skin cancer in her brother and sister; Stroke in her maternal grandfather and maternal uncle. Mother's family + for stroke and heart attacks.   Review of Systems: Please see the history of present illness.   Otherwise, the review of systems is positive for  none.   All other systems are reviewed and negative.   Physical Exam: VS:  BP 110/60 (BP Location: Left Arm, Patient Position: Sitting, Cuff Size: Large)   Pulse (!) 130   Ht 5' 1.5" (1.562 m)   Wt 233 lb 12.8 oz (106.1 kg)   LMP 10/26/1999   BMI 43.46 kg/m  .  BMI Body mass index is 43.46 kg/m.     Wt Readings from Last 3 Encounters:  11/15/17 233 lb 12.8 oz (106.1 kg)  11/08/17 234 lb (106.1 kg)  11/03/17 234 lb (106.1 kg)    General: Pleasant. Obese. Well developed, well nourished and in no acute distress.  HEENT: Normal.  Neck: Supple, no JVD, carotid bruits, or masses noted.  Cardiac: Irregular irregular rhythm. Rate is fast. No murmurs, rubs, or gallops. No edema.  Respiratory:  Lungs are clear to auscultation bilaterally with normal work of breathing.  GI: Soft and nontender.  MS: No deformity or atrophy. Gait and ROM intact.  Skin: Warm and dry. Color is normal.  Neuro:  Strength and sensation are intact and no gross focal deficits noted.  Psych: Alert, appropriate and with normal affect.   LABORATORY DATA:  EKG:  EKG is ordered today. This demonstrates AF with RVR - HR is 130 today. Reviewed with Dr. Marlou Porch here in the office this morning.   RecentLabs       Lab Results  Component Value Date   WBC 8.7 03/01/2017   HGB 13.6 03/01/2017   HCT 40.1 03/01/2017   PLT 231 03/01/2017   GLUCOSE 83 03/01/2017   NA 143 03/01/2017   K 4.1 03/01/2017   CL 100 03/01/2017   CREATININE 0.82 03/01/2017   BUN 14 03/01/2017   CO2 26 03/01/2017   TSH 4.332 02/14/2017   INR 1.15 02/14/2017       BNP (last 3 results) RecentLabs(withinlast365days)     Recent Labs    02/14/17 1755  BNP 416.6*      ProBNP (last 3 results) RecentLabs(withinlast365days)  No results for input(s): PROBNP in the last 8760 hours.     Other Studies Reviewed Today:  Echo Study Conclusions 04/2017  - Left ventricle: The cavity size was mildly  dilated. Wall thickness was normal. Systolic function was mildly to moderately reduced. The estimated ejection fraction was in the range of 40% to 45%. Diffuse hypokinesis. Features are consistent with a pseudonormal left ventricular filling pattern, with concomitant abnormal relaxation and increased filling pressure (grade 2 diastolic dysfunction). - Aortic valve: Transvalvular velocity was increased less than expected, due to low cardiac output. There was mild to moderate stenosis. There was trivial regurgitation. Valve area (VTI): 1.54 cm^2. Valve area (Vmean): 1.5 cm^2. - Mitral valve: There was moderate regurgitation directed centrally. - Left atrium: The atrium was moderately to severely dilated. - Pulmonary arteries: Systolic pressure was mildly increased. PA peak pressure: 36 mm Hg (S).  Assessment/Plan:  1. AF with RVR - on good doses of CCB and low dose BB - BP is soft and limiting titration. Discussed with Dr. Marlou Porch. Will plan to admit, ask EP to see for consideration of AAD therapy +/- cardioversion (tentatively try for tomorrow). CHADS-VASC 3 (F, HTN, Age). Patient has been seen along with Dr. Marlou Porch here in the office and he is in agreement. Will try to rate control with IV Dilt. She reports no missed doses of Xarelto.   2. Chest pain/left arm pain - may be from inadequate rate control?? May be ischemic?? Further disposition to follow. Low risk Myoview from 2015 noted. Not able to perform most recent study that was ordered due to inadequate HR.    3. LV dysfunction - may be related to tachycardia - may be related to ischemia.   4. HTN - BP now soft and limiting titration of her medicines.   5. Seizure disorder - followed by neurology, Dr. Leta Baptist   Current medicines are reviewed with the patient today.  The patient does not have concerns regarding medicines other than what has been noted above.  The following changes have been made:  See  above.  Labs/ tests ordered today include:  Orders Placed This Encounter  Procedures  . EKG 12-Lead     Disposition:   Further disposition pending. Patient to be admitted to [facility] today. EP to see. There are currently no beds available at this time. Dr. Marlou Porch has given the ok to let patient go home and be electively admitted later today. Will tentatively make her NPO after MN tonight for possible cardioversion tomorrow.    Patient is agreeable to this plan and will call if any problems develop in the interim.   SignedTruitt Merle, NP  11/15/2017 10:09 AM  [facility] [address] [place], [place]  [address] Phone: [phone] Fax: [phone]           Personally seen and examined. Agree with above.  72 year old female with persistent atrial fibrillation with rapid ventricular response very difficult to control secondary to marginal blood pressure with diltiazem 240 and metoprolol 25 twice daily.  She does not feel palpitations.  She has been having left-sided chest discomfort/shoulder/arm pain which has been fairly continuous over the last several days.  We ordered a stress test earlier but she was unable to perform this because her heart rate was in the 140 range.  Unfortunately, it is been very challenging to control his heart rate.  She had a cardioversion in April 2018 which was successful at the time but then she was back in atrial fibrillation unknowingly during subsequent visit.  Her ejection fraction is in the 40% range previously.  Could this partially be tachycardia mediated?.  Exam: Irregularly irregular, tachycardic, lungs are clear, no JVD, obese, no acute distress, appears fairly comfortable.  Prior lab work and echocardiograms as well as ECGs personally reviewed.  Assessment and plan:  Persistent atrial fibrillation with rapid ventricular response -EP consultation -Plan on cardioversion, last  cardioversion was April 2018.  I would like to see if EP thinks that we need to place her on antiarrhythmic.  Risk factors for atrial fibrillation include obesity.  With her EF of 40%, options are rate likely amiodarone or Tikosyn.  Ultimately, her reduction in ejection fraction may be in part tachycardia mediated.  Chest pain -Has some atypical qualities to it given the persistent nature of the discomfort.  I think ultimately she needs an ischemic evaluation however we will be disadvantaged if we stop her Xarelto for a cardiac catheterization with because then we would be limited for cardioversion unless we held her Xarelto, performed cardiac catheterization, restarted Xarelto and then performed a TEE cardioversion.  She has not missed any doses of her Xarelto so trying for cardioversion alone may make sense.   Obesity -Continue to encourage weight loss.  Candee Furbish, MD

## 2017-11-15 NOTE — Patient Instructions (Signed)
We are admitting you to [facility] today.

## 2017-11-16 ENCOUNTER — Other Ambulatory Visit: Payer: Self-pay

## 2017-11-16 ENCOUNTER — Encounter (HOSPITAL_COMMUNITY): Payer: Self-pay

## 2017-11-16 DIAGNOSIS — G40909 Epilepsy, unspecified, not intractable, without status epilepticus: Secondary | ICD-10-CM | POA: Diagnosis present

## 2017-11-16 DIAGNOSIS — I481 Persistent atrial fibrillation: Secondary | ICD-10-CM

## 2017-11-16 DIAGNOSIS — M19012 Primary osteoarthritis, left shoulder: Secondary | ICD-10-CM | POA: Diagnosis present

## 2017-11-16 DIAGNOSIS — Z6841 Body Mass Index (BMI) 40.0 and over, adult: Secondary | ICD-10-CM | POA: Diagnosis not present

## 2017-11-16 DIAGNOSIS — Z8249 Family history of ischemic heart disease and other diseases of the circulatory system: Secondary | ICD-10-CM | POA: Diagnosis not present

## 2017-11-16 DIAGNOSIS — I10 Essential (primary) hypertension: Secondary | ICD-10-CM

## 2017-11-16 DIAGNOSIS — I420 Dilated cardiomyopathy: Secondary | ICD-10-CM | POA: Diagnosis present

## 2017-11-16 DIAGNOSIS — I5042 Chronic combined systolic (congestive) and diastolic (congestive) heart failure: Secondary | ICD-10-CM

## 2017-11-16 DIAGNOSIS — E785 Hyperlipidemia, unspecified: Secondary | ICD-10-CM | POA: Diagnosis present

## 2017-11-16 DIAGNOSIS — R079 Chest pain, unspecified: Secondary | ICD-10-CM | POA: Diagnosis not present

## 2017-11-16 DIAGNOSIS — I480 Paroxysmal atrial fibrillation: Secondary | ICD-10-CM | POA: Diagnosis present

## 2017-11-16 DIAGNOSIS — I110 Hypertensive heart disease with heart failure: Secondary | ICD-10-CM | POA: Diagnosis present

## 2017-11-16 DIAGNOSIS — Z86718 Personal history of other venous thrombosis and embolism: Secondary | ICD-10-CM | POA: Diagnosis not present

## 2017-11-16 DIAGNOSIS — I4891 Unspecified atrial fibrillation: Secondary | ICD-10-CM | POA: Diagnosis not present

## 2017-11-16 DIAGNOSIS — Z823 Family history of stroke: Secondary | ICD-10-CM | POA: Diagnosis not present

## 2017-11-16 DIAGNOSIS — Z87891 Personal history of nicotine dependence: Secondary | ICD-10-CM | POA: Diagnosis not present

## 2017-11-16 DIAGNOSIS — Z7982 Long term (current) use of aspirin: Secondary | ICD-10-CM | POA: Diagnosis not present

## 2017-11-16 DIAGNOSIS — Z808 Family history of malignant neoplasm of other organs or systems: Secondary | ICD-10-CM | POA: Diagnosis not present

## 2017-11-16 DIAGNOSIS — Z825 Family history of asthma and other chronic lower respiratory diseases: Secondary | ICD-10-CM | POA: Diagnosis not present

## 2017-11-16 DIAGNOSIS — M1710 Unilateral primary osteoarthritis, unspecified knee: Secondary | ICD-10-CM | POA: Diagnosis present

## 2017-11-16 DIAGNOSIS — Z79899 Other long term (current) drug therapy: Secondary | ICD-10-CM | POA: Diagnosis not present

## 2017-11-16 DIAGNOSIS — I080 Rheumatic disorders of both mitral and aortic valves: Secondary | ICD-10-CM | POA: Diagnosis present

## 2017-11-16 DIAGNOSIS — E7800 Pure hypercholesterolemia, unspecified: Secondary | ICD-10-CM | POA: Diagnosis not present

## 2017-11-16 DIAGNOSIS — Z7901 Long term (current) use of anticoagulants: Secondary | ICD-10-CM | POA: Diagnosis not present

## 2017-11-16 LAB — TROPONIN I: Troponin I: <0.03 ng/mL (ref <0.03)

## 2017-11-16 MED ORDER — METOPROLOL SUCCINATE ER 25 MG PO TB24
25.0000 mg | ORAL_TABLET | Freq: Two times a day (BID) | ORAL | Status: DC
  Administered 2017-11-16 – 2017-11-17 (×3): 25 mg via ORAL
  Filled 2017-11-16 (×5): qty 1

## 2017-11-16 NOTE — Consult Note (Signed)
Cardiology Consultation:   Patient ID: [name]; [id number]; 20-Nov-1945   Admit date: 11/15/2017 Date of Consult: 11/16/2017  Primary Care Provider: Leighton Ruff, MD Primary Cardiologist: Dr. Marlou Porch   Patient Profile:   Ashley Pacheco is a 72 y.o. female with a hx of paroxysmal AFib, NICM (thought to be tachy mediated), HTN, HLD, who is being seen today for the evaluation of AFib at the request of Dr. Oval Linsey.  History of Present Illness:   Ashley Pacheco historically in review of L. Servando Snare, NP note the patient had ahospitalization on 02/14/17 with TEE cardioversion. TEE Echocardiogram EF 40%, may have been related to her tachycardia. Back in 2015 her echo showed normal EF and stress test was normal.  She was seen by Dr. Marlou Porch 11/03/17 noted to be in AF though felt asymptomatic from this and planned for rate control strategy.  A stress test was ordered for c/o some subscapular/left arm discomfort.  Had an "add on" visit with Margarita Grizzle and found in rapid AFib with some c/o CP and referred for admission  She tells me she has no real cardiac awareness, no palpitations, but in hid sight generally thinks that when in AF she has less energy, more easily winded.  NO dizziness, near syncope or syncope.  She denies CP, states she has had for about a week a constant ache to her left shoulder/very superior L chest/scpula and arm.  It is not new in that she has had this more of a waxing/waning.  This week is constant, nothing changes it.  She denies any SOB no symptoms of PND or orthopnea.  LABS K+ 3.6 Mag 2.0 BUN/Creat 12/0.97 WBC 7.1 H/H 12/39 Plts 170 Trop I: <0.03 x3 BNP 356 TSH 2.339   Current rate limiting meds Metoprolol 25mg  daily dilt gtt at 5mg /hr   Past Medical History:  Diagnosis Date  . 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 Atypical chest pain    a. 2015 Neg MV.  . Cardiomegaly 2005  . Cardiomyopathy ([place])    a. 01/2017 Echo: EF 45%, diff HK, mild LVH, triv AI, mild MR, mod dil LA,  mildly dil RA, PASP 64mmHg - in setting of Afib.  [name] DVT (deep venous thrombosis) ([place]) 02/2004  . Fibroid   . Hypercholesteremia   . Hypertension   . Menometrorrhagia 3/92   menopausal  . Microhematuria 1/96  . Nephrolithiasis 2015  . PAF (paroxysmal atrial fibrillation) ([place])    a. 01/2017 TEE/DCCV: EF 45-50%, no LA/LAA thrombus-->successful DCCV @ 150 J. CHA2DS2VASc = 3-->Xarelto.  . Seizures ([place])    followed by [facility] Neu[facility]- Dr. Leta Baptist  . SUI (stress urinary incontinence, female) 9/91  . Vulvar cyst 7/90    Past Surgical History:  Procedure Laterality Date  . APPENDECTOMY  1950  . BUNIONECTOMY  7/11   second toe  . CARDIOVERSION N/A 02/17/2017   Procedure: CARDIOVERSION;  Surgeon: Skeet Latch, MD;  Location: [place];  Service: Cardiovascular;  Laterality: N/A;  . cataracts  3/09 4/09  . DILATION AND CURETTAGE OF UTERUS     hystreroscopy/imcomplete polypectomy  . KNEE SURGERY  10/13 or 11/13   right knee arthroscopy  . TEE WITHOUT CARDIOVERSION N/A 02/17/2017   Procedure: TRANSESOPHAGEAL ECHOCARDIOGRAM (TEE);  Surgeon: Skeet Latch, MD;  Location: [place];  Service: Cardiovascular;  Laterality: N/A;  . [place]  . TUBAL LIGATION  1975   BTL       Inpatient Medications: Scheduled Meds: . aspirin EC  81 mg Oral Daily  . ferrous sulfate  325 mg Oral Q breakfast  . furosemide  40 mg Oral Daily  . levETIRAcetam  500 mg Oral BID  . metoprolol succinate  25 mg Oral Daily  . pravastatin  20 mg Oral q1800  . rivaroxaban  20 mg Oral Q supper  . sertraline  100 mg Oral Daily  . traZODone  150 mg Oral QHS   Continuous Infusions: . diltiazem (CARDIZEM) infusion 5 mg/hr (11/16/17 0230)   PRN Meds: acetaminophen, HYDROcodone-acetaminophen, ondansetron (ZOFRAN) IV  Allergies:    Allergies  Allergen Reactions  . Adhesive [Tape] Rash    Social History:   Social History   Socioeconomic History  . Marital status:  Widowed    Spouse name: Not on file  . Number of children: 2  . Years of education: 80  . Highest education level: Not on file  Social Needs  . Financial resource strain: Not on file  . Food insecurity - worry: Not on file  . Food insecurity - inability: Not on file  . Transportation needs - medical: Not on file  . Transportation needs - non-medical: Not on file  Occupational History    Comment: volunteer  Tobacco Use  . Smoking status: Former Smoker    Types: Cigarettes  . Smokeless tobacco: Never Used  . Tobacco comment: quit in 1969  Substance and Sexual Activity  . Alcohol use: Yes    Alcohol/week: 4.2 oz    Types: 7 Glasses of wine per week    Comment: 1 glass of wine with dinner most nights  . Drug use: No  . Sexual activity: No    Partners: Male  Other Topics Concern  . Not on file  Social History Narrative   Lives alone   Caffeine- coffee- 3 mugs daily    Family History:   Family History  Problem Relation Age of Onset  . Cancer Father        stomach and prostate  . Kidney Stones Father   . Hypertension Mother   . Asthma Mother   . Kidney Stones Mother   . Stroke Maternal Uncle   . Heart attack Maternal Uncle   . Hypertension Maternal Uncle   . Cancer Paternal Uncle   . Stroke Maternal Grandfather   . Heart disease Sister        Rapid rhythm  . Skin cancer Sister   . Heart disease Brother        Rapid rhythm  . Skin cancer Brother      ROS:  Please see the history of present illness.  ROS  All other ROS reviewed and negative.     Physical Exam/Data:   Vitals:   11/16/17 0058 11/16/17 0422 11/16/17 0722 11/16/17 1128  BP: 108/76 102/84 113/78 126/84  Pulse: 95 97 (!) 111 92  Resp: 18 17 20 20   Temp: 97.9 F (36.6 C) 98.5 F (36.9 C) 99.4 F (37.4 C) 98.8 F (37.1 C)  TempSrc: Oral Oral Oral Oral  SpO2: 100% 100% 93% 97%  Weight:  231 lb 12.8 oz (105.1 kg)    Height:        Intake/Output Summary (Last 24 hours) at 11/16/2017 1427 Last  data filed at 11/16/2017 1100 Gross per 24 hour  Intake 275.34 ml  Output 4 ml  Net 271.34 ml   Filed Weights   11/15/17 1716 11/16/17 0422  Weight: 233 lb 4.8 oz (105.8 kg) 231 lb 12.8 oz (105.1 kg)  Body mass index is 43.09 kg/m.  General:  Well nourished, well developed, in no acute distress HEENT: normal Lymph: no adenopathy Neck: no JVD Endocrine:  No thryomegaly Vascular: No carotid bruits  Cardiac:  iRRR; 1/6 SM, gallops or rubs are appreciated Lungs:  CTA b/l, no wheezing, rhonchi or rales  Abd: soft, non-tender, obese Ext: no edema Musculoskeletal:  No deformities, no atrophy Skin: warm and dry  Neuro:  No gross focal abnormalities noted Psych:  Normal affect   EKG:  The EKG was personally reviewed and demonstrates:   11/15/17: AFib, 130bpm, QRS 62ms, QTc 344ms, difficult to measure with rapid AFib, I get QT 351ms, QTc 471 03/01/17: SB, 57bpm, PR 110ms, QRS 42ms, QTc 21ms Telemetry:  Telemetry was personally reviewed and demonstrates:   Afib, 80's-110's, occasionally faster  Relevant CV Studies:  Echo 05/18/17: Study Conclusions - Left ventricle: The cavity size was mildly dilated. Wall thickness was normal. Systolic function was mildly to moderately reduced. The estimated ejection fraction was in the range of 40% to 45%. Diffuse hypokinesis. Features are consistent with a pseudonormal left ventricular filling pattern, with concomitant abnormal relaxation and increased filling pressure (grade 2 diastolic dysfunction). - Aortic valve: Transvalvular velocity was increased less than expected, due to low cardiac output. There was mild to moderate stenosis. There was trivial regurgitation. Valve area (VTI): 1.54 cm^2. Valve area (Vmean): 1.5 cm^2. - Mitral valve: There was moderate regurgitation directed centrally. - Left atrium: The atrium was moderately to severely dilated. - Pulmonary arteries: Systolic pressure was mildly increased.  PA peak pressure: 36 mm Hg (S).    Laboratory Data:  Chemistry Recent Labs  Lab 11/15/17 1725  NA 144  K 3.6  CL 108  CO2 24  GLUCOSE 106*  BUN 12  CREATININE 0.97  CALCIUM 8.3*  GFRNONAA 57*  GFRAA >60  ANIONGAP 12    Recent Labs  Lab 11/15/17 1725  PROT 6.1*  ALBUMIN 3.5  AST 27  ALT 19  ALKPHOS 77  BILITOT 0.5   Hematology Recent Labs  Lab 11/15/17 1725  WBC 7.1  RBC 4.60  HGB 12.8  HCT 39.2  MCV 85.2  MCH 27.8  MCHC 32.7  RDW 13.5  PLT 170   Cardiac Enzymes Recent Labs  Lab 11/15/17 1725 11/15/17 1936 11/16/17 0214  TROPONINI <0.03 <0.03 <0.03   No results for input(s): TROPIPOC in the last 168 hours.  BNP Recent Labs  Lab 11/15/17 1725  BNP 356.4*    DDimer No results for input(s): DDIMER in the last 168 hours.  Radiology/Studies:  No results found.  Assessment and Plan:   1. Persistent AFib     CHA2DS2Vasc is 5, on xarelto     No palpitations, she feels less energetic in AF     Planned for DCCV tomorrow  Asked to evaluate for AAD options with the patient AAD therapy, Tikosyn, amiodarone could be considered, though she is a little reluctant, BP has been felt to be a limiting factor for titration, she tells me her BP is usually high  In review of last few OP visits SBP has been 120-140, a couple lower 100-110,  here she has been about 110-120, and likely will have some room for titration of her medicines.   Dr. Lovena Le has seen the patient, unable to elicit any clear symptoms associated with the AF, recommend 1st try rate control strategy, will stop dilt gtt and increase her metoprolol to BID.  He will discuss with primary cardiology, perhaps  not pursuing DCCV tomorrow.    2. L arm pain     Neg Trop x3, plans for stres test once restored SR  3. NICM     Exam does not suggest fluid OL  4. HTN     BP looks OK  5. VHD     Echo described mild- moderate AS, mentioned transvavlular velocities less then expected likely 2/2 low  CO   For questions or updates, please contact [place] Please consult [email or web address] for contact info under Cardiology/STEMI.   Signed, Baldwin Jamaica, PA-C  11/16/2017 2:27 PM  EP Attending  Patient seen and examined. Agree with the findings as noted above. The patient was admitted with atrial fib and a RVR and very atypical chest pain. Her symptoms are improved. She does not feel palpitations per see. She does feel some increased fatigue. Her blood pressure has been a little soft. I would be surprised if she could maintain NSR without amiodarone. I think the best strategy for now is rate control with metoprolol and digoxin. I would consider cancelling her DCCV. She is not a candidate for atrial fib ablation. She would be a candidate for AV node ablation and PPM although this strategy would only be a consideration if absolutely necessary because her rates could not be controlled.   Mikle Bosworth.D.

## 2017-11-16 NOTE — Progress Notes (Signed)
Progress Note  Patient Name: Ashley Pacheco Date of Encounter: 11/16/2017  Primary Cardiologist: No primary care provider on file.   Subjective   Feeling OK other than constant L arm pain. She denies palpitations or shortness of breath.   Inpatient Medications    Scheduled Meds: . aspirin EC  81 mg Oral Daily  . ferrous sulfate  325 mg Oral Q breakfast  . furosemide  40 mg Oral Daily  . levETIRAcetam  500 mg Oral BID  . metoprolol succinate  25 mg Oral Daily  . pravastatin  20 mg Oral q1800  . rivaroxaban  20 mg Oral Q supper  . sertraline  100 mg Oral Daily  . traZODone  150 mg Oral QHS   Continuous Infusions: . diltiazem (CARDIZEM) infusion 5 mg/hr (11/16/17 0230)   PRN Meds: acetaminophen, HYDROcodone-acetaminophen, ondansetron (ZOFRAN) IV   Vital Signs    Vitals:   11/15/17 1936 11/16/17 0058 11/16/17 0422 11/16/17 0722  BP: 119/78 108/76 102/84 113/78  Pulse: 81 95 97 (!) 111  Resp: 16 18 17 20   Temp: 98.2 F (36.8 C) 97.9 F (36.6 C) 98.5 F (36.9 C) 99.4 F (37.4 C)  TempSrc: Oral Oral Oral Oral  SpO2: 100% 100% 100% 93%  Weight:   231 lb 12.8 oz (105.1 kg)   Height:        Intake/Output Summary (Last 24 hours) at 11/16/2017 1122 Last data filed at 11/16/2017 0930 Gross per 24 hour  Intake 275.34 ml  Output 3 ml  Net 272.34 ml   Filed Weights   11/15/17 1716 11/16/17 0422  Weight: 233 lb 4.8 oz (105.8 kg) 231 lb 12.8 oz (105.1 kg)    Telemetry    Atrial fibrillation rate 90s-140s.  - Personally Reviewed  ECG    N/a - Personally Reviewed  Physical Exam   VS:  BP 126/84 (BP Location: Right Arm)   Pulse 92   Temp 98.8 F (37.1 C) (Oral)   Resp 20   Ht 5' 1.5" (1.562 m)   Wt 231 lb 12.8 oz (105.1 kg)   LMP 10/26/1999   SpO2 97%   BMI 43.09 kg/m  , BMI Body mass index is 43.09 kg/m. GENERAL:  Well appearing HEENT: Pupils equal round and reactive, fundi not visualized, oral mucosa unremarkable NECK:  No jugular venous distention,  waveform within normal limits, carotid upstroke brisk and symmetric, no bruits, no thyromegaly LYMPHATICS:  No cervical adenopathy LUNGS:  Clear to auscultation bilaterally HEART:  Tachycardic.  Irregularly irregular.   PMI not displaced or sustained,S1 and S2 within normal limits, no S3, no S4, no clicks, no rubs, no murmurs ABD:  Flat, positive bowel sounds normal in frequency in pitch, no bruits, no rebound, no guarding, no midline pulsatile mass, no hepatomegaly, no splenomegaly EXT:  2 plus pulses throughout, no edema, no cyanosis no clubbing SKIN:  No rashes no nodules NEURO:  Cranial nerves II through XII grossly intact, motor grossly intact throughout PSYCH:  Cognitively intact, oriented to person place and time   Labs    Chemistry Recent Labs  Lab 11/15/17 1725  NA 144  K 3.6  CL 108  CO2 24  GLUCOSE 106*  BUN 12  CREATININE 0.97  CALCIUM 8.3*  PROT 6.1*  ALBUMIN 3.5  AST 27  ALT 19  ALKPHOS 77  BILITOT 0.5  GFRNONAA 57*  GFRAA >60  ANIONGAP 12     Hematology Recent Labs  Lab 11/15/17 1725  WBC 7.1  RBC 4.60  HGB 12.8  HCT 39.2  MCV 85.2  MCH 27.8  MCHC 32.7  RDW 13.5  PLT 170    Cardiac Enzymes Recent Labs  Lab 11/15/17 1725 11/15/17 1936 11/16/17 0214  TROPONINI <0.03 <0.03 <0.03   No results for input(s): TROPIPOC in the last 168 hours.   BNP Recent Labs  Lab 11/15/17 1725  BNP 356.4*     DDimer No results for input(s): DDIMER in the last 168 hours.   Radiology    No results found.  Cardiac Studies   Echo 05/18/17: Study Conclusions  - Left ventricle: The cavity size was mildly dilated. Wall   thickness was normal. Systolic function was mildly to moderately   reduced. The estimated ejection fraction was in the range of 40%   to 45%. Diffuse hypokinesis. Features are consistent with a   pseudonormal left ventricular filling pattern, with concomitant   abnormal relaxation and increased filling pressure (grade 2   diastolic  dysfunction). - Aortic valve: Transvalvular velocity was increased less than   expected, due to low cardiac output. There was mild to moderate   stenosis. There was trivial regurgitation. Valve area (VTI): 1.54   cm^2. Valve area (Vmean): 1.5 cm^2. - Mitral valve: There was moderate regurgitation directed   centrally. - Left atrium: The atrium was moderately to severely dilated. - Pulmonary arteries: Systolic pressure was mildly increased. PA   peak pressure: 36 mm Hg (S).  Patient Profile     72 y.o. female with paroxysmal atrial fibrillation s/p TEE/DCCV 03/3844, chronic systolic and diastolic heart failure, hypertension, hyperlipidemia, seizure disorder and morbid obesity here with recurrent atrial fibrillation with RVR and L arm pain.   Assessment & Plan    # Paroxysmal atrial fibrillation: Ms. Harrell remains in atrial fibrillation with poorly-controlled rates on a diltiazem drip and metoprolol.  She remains anticoaguated wtih Xarelto.  We will ask EP to see her for consideration of antiarrhythmics.  Tentatively plan for DCCV tomorrow.  She hasn't missed any doses of Xarelto.  # L arm pain: She was unable to complete this as an outpatient due to tachycardia.  Will plan to get [facility] prior to discharge once she is in sinus rhythm.  Troponin has been negative.   # Chronic systolic and diastolic heart failure: Ms. Zima is euvolemic.  Echo pending.  Continue lasix.  Ideally she wouldn't be on diltiazem.  # Hypertension: BP controlled on current regimen.   # Hyperlipidemia: Continue pravastatin.   For questions or updates, please contact [place] Please consult [email or web address] for contact info under Cardiology/STEMI.      Signed, Skeet Latch, MD  11/16/2017, 11:22 AM

## 2017-11-16 NOTE — Care Management Obs Status (Signed)
[place] NOTIFICATION   Patient Details  Name: Ashley Pacheco MRN: [id number] Date of Birth: 1946-10-22   Medicare Observation Status Notification Given:  Yes    Carles Collet, RN 11/16/2017, 10:03 AM

## 2017-11-17 ENCOUNTER — Encounter (HOSPITAL_COMMUNITY)
Admission: AD | Disposition: A | Discharge status: Home / Self Care | Payer: Self-pay | Source: Ambulatory Visit | Attending: Cardiology

## 2017-11-17 SURGERY — CARDIOVERSION
Anesthesia: General

## 2017-11-17 MED ORDER — FUROSEMIDE 40 MG PO TABS
40.0000 mg | ORAL_TABLET | Freq: Every day | ORAL | Status: DC
  Administered 2017-11-17 – 2017-11-19 (×3): 40 mg via ORAL
  Filled 2017-11-17 (×3): qty 1

## 2017-11-17 MED ORDER — ASPIRIN EC 81 MG PO TBEC
81.0000 mg | DELAYED_RELEASE_TABLET | Freq: Every day | ORAL | Status: DC
  Administered 2017-11-17 – 2017-11-18 (×2): 81 mg via ORAL
  Filled 2017-11-17 (×2): qty 1

## 2017-11-17 MED ORDER — DIGOXIN 0.25 MG/ML IJ SOLN
0.2500 mg | Freq: Every day | INTRAMUSCULAR | Status: DC
  Administered 2017-11-17 – 2017-11-18 (×2): 0.25 mg via INTRAVENOUS
  Filled 2017-11-17 (×2): qty 2

## 2017-11-17 MED ORDER — FERROUS SULFATE 325 (65 FE) MG PO TABS
325.0000 mg | ORAL_TABLET | Freq: Every day | ORAL | Status: DC
  Administered 2017-11-17 – 2017-11-19 (×3): 325 mg via ORAL
  Filled 2017-11-17 (×3): qty 1

## 2017-11-17 NOTE — Progress Notes (Signed)
Progress Note  Patient Name: Ashley Pacheco Date of Encounter: 11/17/2017  Primary Cardiologist: Candee Furbish, MD   Subjective   C/w same L shoulder "ache", no CP, palpitations, no rest SOB  Inpatient Medications    Scheduled Meds: . aspirin EC  81 mg Oral Daily  . digoxin  0.25 mg Intravenous Daily  . ferrous sulfate  325 mg Oral Q breakfast  . furosemide  40 mg Oral Daily  . levETIRAcetam  500 mg Oral BID  . metoprolol succinate  25 mg Oral BID  . pravastatin  20 mg Oral q1800  . rivaroxaban  20 mg Oral Q supper  . sertraline  100 mg Oral Daily  . traZODone  150 mg Oral QHS   Continuous Infusions:  PRN Meds: acetaminophen, HYDROcodone-acetaminophen, ondansetron (ZOFRAN) IV   Vital Signs    Vitals:   11/16/17 1536 11/16/17 1729 11/16/17 2006 11/17/17 0442  BP: 120/85 (!) 143/100 123/90 (!) 113/94  Pulse: (!) 111  (!) 125 98  Resp: 20  20 10   Temp: 98.7 F (37.1 C)  98.8 F (37.1 C) 98.3 F (36.8 C)  TempSrc: Oral  Oral Oral  SpO2: 93%  96% 96%  Weight:    230 lb 11.2 oz (104.6 kg)  Height:        Intake/Output Summary (Last 24 hours) at 11/17/2017 0914 Last data filed at 11/16/2017 1900 Gross per 24 hour  Intake 840 ml  Output 4 ml  Net 836 ml   Filed Weights   11/15/17 1716 11/16/17 0422 11/17/17 0442  Weight: 233 lb 4.8 oz (105.8 kg) 231 lb 12.8 oz (105.1 kg) 230 lb 11.2 oz (104.6 kg)    Telemetry    AFib generally 110-120 - Personally Reviewed  ECG    No new EKG - Personally Reviewed  Physical Exam   GEN: No acute distress.   Neck: No JVD Cardiac: iRRR, tachycardic, 1/6 SM, no rubs, or gallops.  Respiratory: CTA b/l. GI: Soft, nontender, non-distended  MS: No pitting edema; No deformity. Neuro:  Nonfocal  Psych: Normal affect   Labs    Chemistry Recent Labs  Lab 11/15/17 1725  NA 144  K 3.6  CL 108  CO2 24  GLUCOSE 106*  BUN 12  CREATININE 0.97  CALCIUM 8.3*  PROT 6.1*  ALBUMIN 3.5  AST 27  ALT 19  ALKPHOS 77    BILITOT 0.5  GFRNONAA 57*  GFRAA >60  ANIONGAP 12     Hematology Recent Labs  Lab 11/15/17 1725  WBC 7.1  RBC 4.60  HGB 12.8  HCT 39.2  MCV 85.2  MCH 27.8  MCHC 32.7  RDW 13.5  PLT 170    Cardiac Enzymes Recent Labs  Lab 11/15/17 1725 11/15/17 1936 11/16/17 0214  TROPONINI <0.03 <0.03 <0.03   No results for input(s): TROPIPOC in the last 168 hours.   BNP Recent Labs  Lab 11/15/17 1725  BNP 356.4*     DDimer No results for input(s): DDIMER in the last 168 hours.   Radiology    No results found.  Cardiac Studies   Echo 05/18/17: Study Conclusions - Left ventricle: The cavity size was mildly dilated. Wall thickness was normal. Systolic function was mildly to moderately reduced. The estimated ejection fraction was in the range of 40% to 45%. Diffuse hypokinesis. Features are consistent with a pseudonormal left ventricular filling pattern, with concomitant abnormal relaxation and increased filling pressure (grade 2 diastolic dysfunction). - Aortic valve: Transvalvular velocity was  increased less than expected, due to low cardiac output. There was mild to moderate stenosis. There was trivial regurgitation. Valve area (VTI): 1.54 cm^2. Valve area (Vmean): 1.5 cm^2. - Mitral valve: There was moderate regurgitation directed centrally. - Left atrium: The atrium was moderately to severely dilated. - Pulmonary arteries: Systolic pressure was mildly increased. PA peak pressure: 36 mm Hg (S).    Patient Profile     72 y.o. female with a hx of paroxysmal AFib, NICM (thought to be tachy mediated), HTN, HLD, w/rapid AF, an L arm discomfort, ? anginal  Assessment & Plan     1. Persistent AFib     CHA2DS2Vasc is 5, on xarelto      Off dilt gtt, BP tolerating increased metoprolol, will add dig today, rates not yet controlled  2. L arm pain     Neg Trop x3,      W/u with primary cardiology team  3. NICM     Exam does not suggest  fluid OL  4. HTN     BP looks OK  5. VHD     Echo described mild- moderate AS, mentioned transvavlular velocities less then expected likely 2/2 low CO          For questions or updates, please contact [place] Please consult [email or web address] for contact info under Cardiology/STEMI.      Signed, Baldwin [place], PA-C  11/17/2017, 9:14 AM    EP Attending  Patient seen and examined. Agree with above. She continues to have rapid atrial fib. Her BP has been a little soft. Will add digoxin and plan to uptitrate her beta blocker as her BP allows. I strongly suspect her shoulder pain is arthritis.  Mikle Bosworth.D.

## 2017-11-17 NOTE — Progress Notes (Signed)
Progress Note  Patient Name: Ashley Pacheco Date of Encounter: 11/17/2017  Primary Cardiologist: Candee Furbish, MD   Subjective   Fatigue and left arm discomfort.  Inpatient Medications    Scheduled Meds: . aspirin EC  81 mg Oral Daily  . digoxin  0.25 mg Intravenous Daily  . ferrous sulfate  325 mg Oral Q breakfast  . furosemide  40 mg Oral Daily  . levETIRAcetam  500 mg Oral BID  . metoprolol succinate  25 mg Oral BID  . pravastatin  20 mg Oral q1800  . rivaroxaban  20 mg Oral Q supper  . sertraline  100 mg Oral Daily  . traZODone  150 mg Oral QHS   Continuous Infusions:  PRN Meds: acetaminophen, HYDROcodone-acetaminophen, ondansetron (ZOFRAN) IV   Vital Signs    Vitals:   11/16/17 1729 11/16/17 2006 11/17/17 0442 11/17/17 0949  BP: (!) 143/100 123/90 (!) 113/94 119/87  Pulse:  (!) 125 98 (!) 112  Resp:  20 10   Temp:  98.8 F (37.1 C) 98.3 F (36.8 C)   TempSrc:  Oral Oral   SpO2:  96% 96%   Weight:   230 lb 11.2 oz (104.6 kg)   Height:        Intake/Output Summary (Last 24 hours) at 11/17/2017 1039 Last data filed at 11/16/2017 1900 Gross per 24 hour  Intake 840 ml  Output 3 ml  Net 837 ml   Filed Weights   11/15/17 1716 11/16/17 0422 11/17/17 0442  Weight: 233 lb 4.8 oz (105.8 kg) 231 lb 12.8 oz (105.1 kg) 230 lb 11.2 oz (104.6 kg)    Telemetry    Atrial fibrillation rate 100s-140s.  - Personally Reviewed  ECG    N/a - Personally Reviewed  Physical Exam   VS:  BP 119/87   Pulse (!) 112   Temp 98.3 F (36.8 C) (Oral)   Resp 10   Ht 5' 1.5" (1.562 m)   Wt 230 lb 11.2 oz (104.6 kg)   LMP 10/26/1999   SpO2 96%   BMI 42.88 kg/m  , BMI Body mass index is 42.88 kg/m. GENERAL:  Well appearing.  No acute distress HEENT: Pupils equal round and reactive, fundi not visualized, oral mucosa unremarkable NECK:  No jugular venous distention, waveform within normal limits, carotid upstroke brisk and symmetric, no bruits LUNGS:  Clear to  auscultation bilaterally.  No crackles, wheezes, or rhonchi.  HEART:  Tachycardic.  Irregularly irregular.   PMI not displaced or sustained,S1 and S2 within normal limits, no S3, no S4, no clicks, no rubs, no murmurs ABD:  Flat, positive bowel sounds normal in frequency in pitch, no bruits, no rebound, no guarding, no midline pulsatile mass, no hepatomegaly, no splenomegaly EXT:  2 plus pulses throughout, no edema, no cyanosis no clubbing SKIN:  No rashes no nodules NEURO:  Cranial nerves II through XII grossly intact, motor grossly intact throughout PSYCH:  Cognitively intact, oriented to person place and time   Labs    Chemistry Recent Labs  Lab 11/15/17 1725  NA 144  K 3.6  CL 108  CO2 24  GLUCOSE 106*  BUN 12  CREATININE 0.97  CALCIUM 8.3*  PROT 6.1*  ALBUMIN 3.5  AST 27  ALT 19  ALKPHOS 77  BILITOT 0.5  GFRNONAA 57*  GFRAA >60  ANIONGAP 12     Hematology Recent Labs  Lab 11/15/17 1725  WBC 7.1  RBC 4.60  HGB 12.8  HCT 39.2  MCV 85.2  MCH 27.8  MCHC 32.7  RDW 13.5  PLT 170    Cardiac Enzymes Recent Labs  Lab 11/15/17 1725 11/15/17 1936 11/16/17 0214  TROPONINI <0.03 <0.03 <0.03   No results for input(s): TROPIPOC in the last 168 hours.   BNP Recent Labs  Lab 11/15/17 1725  BNP 356.4*     DDimer No results for input(s): DDIMER in the last 168 hours.   Radiology    No results found.  Cardiac Studies   Echo 05/18/17: Study Conclusions  - Left ventricle: The cavity size was mildly dilated. Wall   thickness was normal. Systolic function was mildly to moderately   reduced. The estimated ejection fraction was in the range of 40%   to 45%. Diffuse hypokinesis. Features are consistent with a   pseudonormal left ventricular filling pattern, with concomitant   abnormal relaxation and increased filling pressure (grade 2   diastolic dysfunction). - Aortic valve: Transvalvular velocity was increased less than   expected, due to low cardiac  output. There was mild to moderate   stenosis. There was trivial regurgitation. Valve area (VTI): 1.54   cm^2. Valve area (Vmean): 1.5 cm^2. - Mitral valve: There was moderate regurgitation directed   centrally. - Left atrium: The atrium was moderately to severely dilated. - Pulmonary arteries: Systolic pressure was mildly increased. PA   peak pressure: 36 mm Hg (S).  Patient Profile     72 y.o. female with paroxysmal atrial fibrillation s/p TEE/DCCV 06/239, chronic systolic and diastolic heart failure, hypertension, hyperlipidemia, seizure disorder and morbid obesity here with recurrent atrial fibrillation with RVR and L arm pain.   Assessment & Plan    # Paroxysmal atrial fibrillation: [name] remains in atrial fibrillation with poorly-controlled rates.  Diltiazem infusion was discontinued and she was started on metoprolol yesterday.  Her heart rates are slightly higher today.  She will start digoxin IV today.  Per EP it is unlikely that she will maintain sinus rhythm with any antiarrhythmic other than amiodarone.  Continue Xarelto.  They recommended holding off on cardioversion for now given that she has minimal symptoms.   # L arm pain: She was unable to complete this as an outpatient due to tachycardia.  Will plan to get [facility] prior to discharge once her heart rate is better-controlled.  Troponin has been negative.   # Chronic systolic and diastolic heart failure: [name] is euvolemic.  LVEF 40-45%.  Continue lasix.  Diltiazem was switched to metoprolol  # Hypertension: BP controlled on current regimen.   # Hyperlipidemia: Continue pravastatin.   For questions or updates, please contact [place] Please consult [email or web address] for contact info under Cardiology/STEMI.      Signed, Skeet Latch, MD  11/17/2017, 10:39 AM

## 2017-11-18 MED ORDER — METOPROLOL SUCCINATE ER 50 MG PO TB24
50.0000 mg | ORAL_TABLET | Freq: Two times a day (BID) | ORAL | Status: DC
  Administered 2017-11-18 – 2017-11-19 (×3): 50 mg via ORAL
  Filled 2017-11-18 (×3): qty 1

## 2017-11-18 NOTE — Progress Notes (Signed)
Progress Note  Patient Name: Ashley Pacheco Date of Encounter: 11/18/2017  Primary Cardiologist: Candee Furbish, MD   Subjective   C/w same L shoulder "ache", no CP, palpitations, no rest SOB  Inpatient Medications    Scheduled Meds: . aspirin EC  81 mg Oral Daily  . digoxin  0.25 mg Intravenous Daily  . ferrous sulfate  325 mg Oral Q breakfast  . furosemide  40 mg Oral Daily  . levETIRAcetam  500 mg Oral BID  . metoprolol succinate  50 mg Oral BID  . pravastatin  20 mg Oral q1800  . rivaroxaban  20 mg Oral Q supper  . sertraline  100 mg Oral Daily  . traZODone  150 mg Oral QHS   Continuous Infusions:  PRN Meds: acetaminophen, HYDROcodone-acetaminophen, ondansetron (ZOFRAN) IV   Vital Signs    Vitals:   11/17/17 1306 11/17/17 2037 11/17/17 2118 11/18/17 0622  BP: (!) 118/91 (!) 143/99 (!) 134/92 (!) 128/98  Pulse: (!) 125 69  93  Resp: 20 18  16   Temp: 98.5 F (36.9 C) 98.9 F (37.2 C)  98.2 F (36.8 C)  TempSrc: Oral Oral  Oral  SpO2: 95% 98%  94%  Weight:    228 lb 4.8 oz (103.6 kg)  Height:        Intake/Output Summary (Last 24 hours) at 11/18/2017 0924 Last data filed at 11/17/2017 1700 Gross per 24 hour  Intake 780 ml  Output -  Net 780 ml   Filed Weights   11/16/17 0422 11/17/17 0442 11/18/17 0622  Weight: 231 lb 12.8 oz (105.1 kg) 230 lb 11.2 oz (104.6 kg) 228 lb 4.8 oz (103.6 kg)    Telemetry    AFib generally 110-120 - Personally Reviewed  ECG    No new EKG - Personally Reviewed  Physical Exam   Exam is unchanged GEN: No acute distress.   Neck: 6 cm JVD Cardiac: iRRR, tachycardic, 1/6 SM, no rubs, or gallops.  Respiratory: CTA b/l. GI: Soft, nontender, non-distended  MS: No pitting edema; No deformity. Neuro:  Nonfocal  Psych: Normal affect   Labs    Chemistry Recent Labs  Lab 11/15/17 1725  NA 144  K 3.6  CL 108  CO2 24  GLUCOSE 106*  BUN 12  CREATININE 0.97  CALCIUM 8.3*  PROT 6.1*  ALBUMIN 3.5  AST 27  ALT 19    ALKPHOS 77  BILITOT 0.5  GFRNONAA 57*  GFRAA >60  ANIONGAP 12     Hematology Recent Labs  Lab 11/15/17 1725  WBC 7.1  RBC 4.60  HGB 12.8  HCT 39.2  MCV 85.2  MCH 27.8  MCHC 32.7  RDW 13.5  PLT 170    Cardiac Enzymes Recent Labs  Lab 11/15/17 1725 11/15/17 1936 11/16/17 0214  TROPONINI <0.03 <0.03 <0.03   No results for input(s): TROPIPOC in the last 168 hours.   BNP Recent Labs  Lab 11/15/17 1725  BNP 356.4*     DDimer No results for input(s): DDIMER in the last 168 hours.   Radiology    No results found.  Cardiac Studies   Echo 05/18/17: Study Conclusions - Left ventricle: The cavity size was mildly dilated. Wall thickness was normal. Systolic function was mildly to moderately reduced. The estimated ejection fraction was in the range of 40% to 45%. Diffuse hypokinesis. Features are consistent with a pseudonormal left ventricular filling pattern, with concomitant abnormal relaxation and increased filling pressure (grade 2 diastolic dysfunction). - Aortic  valve: Transvalvular velocity was increased less than expected, due to low cardiac output. There was mild to moderate stenosis. There was trivial regurgitation. Valve area (VTI): 1.54 cm^2. Valve area (Vmean): 1.5 cm^2. - Mitral valve: There was moderate regurgitation directed centrally. - Left atrium: The atrium was moderately to severely dilated. - Pulmonary arteries: Systolic pressure was mildly increased. PA peak pressure: 36 mm Hg (S).    Patient Profile     72 y.o. female with a hx of paroxysmal AFib, NICM (thought to be tachy mediated), HTN, HLD, w/rapid AF, an L arm discomfort, ? Anginal though suspect is MS  Assessment & Plan     1. Persistent AFib     CHA2DS2Vasc is 5, on xarelto     Not clearly symptomatic with her AF,      BP tolerating increased metoprolol, will give dig again today, rates not yet controlled, will up-titrate BB further.  2. L arm  pain     Neg Trop x3     Constant ache, early this AM she woke laying on her L side with increased aching discomfort, warm compress helped, likely is MS, though will defer to primary cardiology team     pending stress test  3. NICM     Exam does not suggest fluid OL  4. HTN     BP looks OK  5. VHD     Echo described mild- moderate AS, mentioned transvavlular velocities less then expected likely 2/2 low CO     For questions or updates, please contact [place] Please consult [email or web address] for contact info under Cardiology/STEMI.      Signed, Baldwin Jamaica, PA-C  11/18/2017, 9:24 AM    EP Attending  Patient seen and examined. Agree with above. Her rates are still increased but her blood pressure is better. She still has shoulder pain which appears not to be ischemia rather arthritis. We will continue IV digoxin and uptitrate her oral toprol to 50 bid. If she does not slow down would consider AV node ablation. Hopefully this can be avoided.  Mikle Bosworth.D.

## 2017-11-18 NOTE — Progress Notes (Signed)
Progress Note  Patient Name: Ashley Pacheco Date of Encounter: 11/18/2017  Primary Cardiologist: Ashley Furbish, MD   Subjective   Pt with no palpitations, lightheadedness or shortness of breath. She feels tired but she says this is her norm. She is having constant aching in the upper left chest through the shoulder and into the upper left arm. This is worse when she lays on it and she has point tenderness in the joint when I push with my finger.   Inpatient Medications    Scheduled Meds: . aspirin EC  81 mg Oral Daily  . ferrous sulfate  325 mg Oral Q breakfast  . furosemide  40 mg Oral Daily  . levETIRAcetam  500 mg Oral BID  . metoprolol succinate  50 mg Oral BID  . pravastatin  20 mg Oral q1800  . rivaroxaban  20 mg Oral Q supper  . sertraline  100 mg Oral Daily  . traZODone  150 mg Oral QHS   Continuous Infusions:  PRN Meds: acetaminophen, HYDROcodone-acetaminophen, ondansetron (ZOFRAN) IV   Vital Signs    Vitals:   11/17/17 1306 11/17/17 2037 11/17/17 2118 11/18/17 0622  BP: (!) 118/91 (!) 143/99 (!) 134/92 (!) 128/98  Pulse: (!) 125 69  93  Resp: 20 18  16   Temp: 98.5 F (36.9 C) 98.9 F (37.2 C)  98.2 F (36.8 C)  TempSrc: Oral Oral  Oral  SpO2: 95% 98%  94%  Weight:    228 lb 4.8 oz (103.6 kg)  Height:        Intake/Output Summary (Last 24 hours) at 11/18/2017 1017 Last data filed at 11/17/2017 1700 Gross per 24 hour  Intake 600 ml  Output -  Net 600 ml   Filed Weights   11/16/17 0422 11/17/17 0442 11/18/17 0622  Weight: 231 lb 12.8 oz (105.1 kg) 230 lb 11.2 oz (104.6 kg) 228 lb 4.8 oz (103.6 kg)    Telemetry    Afib in the 80's overnight, 90's-110's this am.  - Personally Reviewed  ECG    No new tracings.  - Personally Reviewed  Physical Exam   GEN: No acute distress.   Neck: No JVD Cardiac: Irregularly irregular rhythm, no murmurs, rubs, or gallops.  Respiratory: Clear to auscultation bilaterally. GI: Soft, nontender, non-distended    MS: No edema; No deformity. Neuro:  Nonfocal  Psych: Normal affect   Labs    Chemistry Recent Labs  Lab 11/15/17 1725  NA 144  K 3.6  CL 108  CO2 24  GLUCOSE 106*  BUN 12  CREATININE 0.97  CALCIUM 8.3*  PROT 6.1*  ALBUMIN 3.5  AST 27  ALT 19  ALKPHOS 77  BILITOT 0.5  GFRNONAA 57*  GFRAA >60  ANIONGAP 12     Hematology Recent Labs  Lab 11/15/17 1725  WBC 7.1  RBC 4.60  HGB 12.8  HCT 39.2  MCV 85.2  MCH 27.8  MCHC 32.7  RDW 13.5  PLT 170    Cardiac Enzymes Recent Labs  Lab 11/15/17 1725 11/15/17 1936 11/16/17 0214  TROPONINI <0.03 <0.03 <0.03   No results for input(s): TROPIPOC in the last 168 hours.   BNP Recent Labs  Lab 11/15/17 1725  BNP 356.4*     DDimer No results for input(s): DDIMER in the last 168 hours.   Radiology    No results found.  Cardiac Studies   Echo 05/18/17: Study Conclusions  - Left ventricle: The cavity size was mildly dilated. Wall thickness  was normal. Systolic function was mildly to moderately reduced. The estimated ejection fraction was in the range of 40% to 45%. Diffuse hypokinesis. Features are consistent with a pseudonormal left ventricular filling pattern, with concomitant abnormal relaxation and increased filling pressure (grade 2 diastolic dysfunction). - Aortic valve: Transvalvular velocity was increased less than expected, due to low cardiac output. There was mild to moderate stenosis. There was trivial regurgitation. Valve area (VTI): 1.54 cm^2. Valve area (Vmean): 1.5 cm^2. - Mitral valve: There was moderate regurgitation directed centrally. - Left atrium: The atrium was moderately to severely dilated. - Pulmonary arteries: Systolic pressure was mildly increased. PA peak pressure: 36 mm Hg (S).    Patient Profile     72 y.o. female with paroxysmal atrial fibrillation s/p TEE/DCCV 03/2835, chronic systolic and diastolic heart failure, hypertension, hyperlipidemia,  seizure disorder and morbid obesity here with recurrent atrial fibrillation with RVR and L arm pain.   Assessment & Plan    Paroxysmal atrial fibrillation -Previously controlled with diltiazem and metoprolol, doses limited by soft BP.  -Remains in afib with poorly controlled rate. She has minimal symptoms.  -EP consulted. Stopped diltiazem and increased BB. Noted that it is unlikely that pt will remain in NSR with any antiarrhythmic other than amiodarone.  They recommended holding off on cardioversion for now given that she has minimal symptoms.  -IV digoxin started yesterday. -BB uptitrated by EP this am. BP stable.  -CHA2DS2Vasc is 5, on xarelto  # L arm pain: She was unable to complete this as an outpatient due to tachycardia.  Will plan to get [facility] prior to discharge once her heart rate is better-controlled.  Troponin has been negative.   # Chronic systolic and diastolic heart failure: [name] is euvolemic.  LVEF 40-45%.  Continue lasix.  Diltiazem was switched to metoprolol  # Hypertension: SBP good, diastolic BP elevated in the 90's. BB uptitrated for heart rate control. Continue to monitor.  # Hyperlipidemia: Continue pravastatin.     For questions or updates, please contact [place] Please consult [email or web address] for contact info under Cardiology/STEMI.      Signed, Ashley Perch, NP  11/18/2017, 10:17 AM

## 2017-11-18 NOTE — Consult Note (Signed)
           [facility] Primary Care Navigator  11/18/2017  Ashley Pacheco 02/03/1946 [id number]   Wentto see patient at the bedsideto identify possible discharge needs. Patientreports "feeling easily tired and short of breath"; "does not feel heart rate being fast which was found on routine check-up" which had led to this admission.  Patientendorses Dr.Elizabeth Drema Pacheco with [name] as herprimary care provider.   [facility] [address] [facility].   Patient reportsmanagingher ownmedications at [name] out of the containers.  Patient states she was driving until she had a seizure episode. She reports using [employer] transportation to her doctors'appointments. Patient mentioned having a sister and sister in-law that she can ask for transportation assistance if needed.  Patient verbalized thatshe lives alone, uses cane and independent with self care but son Ashley Pacheco) will be able to assist if she needed any help at home. Patient is also considering to have "life alert".  Per patient, anticipateddischarge planishome, awaiting physician order pending medical interventions.  Patientvoiced understanding to call primary care provider's officewhen shereturnshome,for a post discharge follow-up within 1-2 weeks or sooner if needs arise.Patient letter (with PCP's contact number) was provided as areminder.   Discussed with patient regarding [facility] CM services available for health management at home but she expressed decline for services that wasoffered to her on different times includingEMMI calls to follow-upher recovery.She mentioned that she is managing at home so far and would not like to be overwhelmed as she recovers.  Patient expressed understanding of needto seekreferral from primary care provider to [facility] care management if she changes her mind and if  deemednecessaryand appropriate for services in the near future.  [facility] care management information was provided for future needs that she may have.  Primary care provider's office is listed as providing transition of care (TOC).   For questions, please contact:  Ashley Pacheco, BSN, RN- [facility] Primary Care Navigator  Telephone: [phone] [place]

## 2017-11-19 ENCOUNTER — Inpatient Hospital Stay (HOSPITAL_COMMUNITY): Payer: Medicare Other

## 2017-11-19 DIAGNOSIS — R079 Chest pain, unspecified: Secondary | ICD-10-CM

## 2017-11-19 LAB — NM MYOCAR MULTI W/SPECT W/WALL MOTION / EF
CHL CUP RESTING HR STRESS: 97 {beats}/min
CSEPPHR: 105 {beats}/min

## 2017-11-19 MED ORDER — DIGOXIN 125 MCG PO TABS: 0.1250 mg | ORAL_TABLET | Freq: Every day | ORAL | 11 refills | Status: DC

## 2017-11-19 MED ORDER — METOPROLOL SUCCINATE ER 50 MG PO TB24: 50.0000 mg | ORAL_TABLET | Freq: Two times a day (BID) | ORAL | 11 refills | Status: DC

## 2017-11-19 MED ORDER — TECHNETIUM TC 99M TETROFOSMIN IV KIT
10.0000 | PACK | Freq: Once | INTRAVENOUS | Status: AC | PRN
  Administered 2017-11-19: 10 via INTRAVENOUS

## 2017-11-19 MED ORDER — REGADENOSON 0.4 MG/5ML IV SOLN
INTRAVENOUS | Status: AC
  Filled 2017-11-19: qty 5

## 2017-11-19 MED ORDER — TECHNETIUM TC 99M TETROFOSMIN IV KIT
30.0000 | PACK | Freq: Once | INTRAVENOUS | Status: AC | PRN
  Administered 2017-11-19: 30 via INTRAVENOUS

## 2017-11-19 MED ORDER — DIGOXIN 125 MCG PO TABS
0.1250 mg | ORAL_TABLET | Freq: Every day | ORAL | Status: DC
  Administered 2017-11-19: 0.125 mg via ORAL
  Filled 2017-11-19: qty 1

## 2017-11-19 MED ORDER — REGADENOSON 0.4 MG/5ML IV SOLN
0.4000 mg | Freq: Once | INTRAVENOUS | Status: AC
  Administered 2017-11-19: 0.4 mg via INTRAVENOUS
  Filled 2017-11-19: qty 5

## 2017-11-19 NOTE — Discharge Summary (Signed)
Discharge Summary    Patient ID: Ashley Pacheco,  MRN: [id number], DOB/AGE: 28-Dec-1945 72 y.o.  Admit date: 11/15/2017 Discharge date: 11/19/2017  Primary Care Provider: Leighton Ruff Primary Cardiologist: Dr. Marlou Porch  Discharge Diagnoses    Active Problems:   Atrial fibrillation with RVR [facility])  History of Present Illness     Ashley Pacheco is a 72 y.o. female with past medical history of PAF (on Xarelto), history of cardiomyopathy (EF 40% by echo in 2018), HTN, and HLD who presented to the office on 11/15/2017 for evaluation of left-sided chest pain and palpitations.  An EKG was obtained at that time and showed she was in atrial fibrillation with RVR, HR in the 130's. BP was soft and therefore limited the titration of her AV nodal blocking agents. It was therefore recommended she be admitted for further evaluation.   Hospital Course     Consultants: EP   The following morning, she denied any palpitations or dyspnea and HR was variable in the 90's to 140's on IV Cardizem. EP was consulted for consideration of antiarrhythmic options but recommended a rate-control strategy initially with consideration of AV node ablation and PPM placement only if absolutely necessary. Repeat DCCV was not recommended as she was overall asymptomatic. She was therefore transitioned from IV Cardizem to Lopressor and loaded with IV Digoxin. Rates remained difficult to control and Lopressor was titrated to 50mg  BID on 11/18/2017. IV Digoxin was transitioned to PO Dig 0.125mg  daily.   She had been scheduled for an outpatient NST but this was postponed due to her tachycardia. With rates having improved and her known reduced EF, a Lexiscan Myoview was arranged for 1/26. This showed no ischemia.  It was read out as high risk due to low EF (EF 34%).  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  A repeat echocardiogram will likely need to be obtained at some point in the  next several weeks to reassess her LV function. No further ischemic testing is needed at this time.     She was last examined by Dr. Stanford Breed and deemed stable for discharge. A staff message has been sent to the office to arrange for follow-up. She was discharged home in good condition.    _____________  Discharge Vitals Blood pressure 103/62, pulse (!) 107, temperature 97.7 F (36.5 C), temperature source Oral, resp. rate 16, height 5' 1.5" (1.562 m), weight 227 lb 9.6 oz (103.2 kg), last menstrual period 10/26/1999, SpO2 96 %.  Filed Weights   11/17/17 0442 11/18/17 0622 11/19/17 0523  Weight: 230 lb 11.2 oz (104.6 kg) 228 lb 4.8 oz (103.6 kg) 227 lb 9.6 oz (103.2 kg)    Labs & Radiologic Studies    Lab Results  Component Value Date   WBC 7.1 11/15/2017   HGB 12.8 11/15/2017   HCT 39.2 11/15/2017   MCV 85.2 11/15/2017   PLT 170 11/15/2017    Lab Results  Component Value Date   TROPONINI <0.03 11/16/2017   TROPONINI <0.03 11/15/2017   TROPONINI <0.03 11/15/2017    Sodium  Date/Time Value Ref Range Status  11/15/2017 05:25 PM 144 135 - 145 mmol/L Final  03/01/2017 11:36 AM 143 134 - 144 mmol/L Final   Potassium  Date/Time Value Ref Range Status  11/15/2017 05:25 PM 3.6 3.5 - 5.1 mmol/L Final   Creatinine, Ser  Date/Time Value Ref Range Status  11/15/2017 05:25 PM 0.97 0.44 - 1.00 mg/dL Final  Lab Results  Component Value Date   TSH 2.339 11/15/2017      Nm Myocar Multi W/spect W/wall Motion / Ef  Result Date: 11/19/2017  IMPRESSION: 1. No reversible ischemia or infarction. 2. Global hypokinesia. 3. Left ventricular ejection fraction 34% 4. Non invasive risk stratification*: High *2012 Appropriate Use Criteria for Coronary Revascularization Focused Update: J Am Coll Cardiol. 7408;14(4):818-563. http://content.airportbarriers.com.aspx?articleid=1201161 Electronically Signed   By: Corrie Mckusick D.O.   On: 11/19/2017 14:15     Diagnostic Studies/Procedures      None Performed  Disposition   Pt is being discharged home today in good condition.  Follow-up Plans & Appointments    Follow-up Information    Jerline Pain, MD Follow up in 2 week(s).   Specialty:  Cardiology Why:  The office will contact you within 2-3 business days to arrange follow-up. If you do not hear from them, please call the number provided.  Contact information: [address] [place] [address] [phone]          Discharge Instructions    (HEART FAILURE PATIENTS) Call MD:  Anytime you have any of the following symptoms: 1) 3 pound weight gain in 24 hours or 5 pounds in 1 week 2) shortness of breath, with or without a dry hacking cough 3) swelling in the hands, feet or stomach 4) if you have to sleep on extra pillows at night in order to breathe.   Complete by:  As directed    Diet - low sodium heart healthy   Complete by:  As directed    Increase activity slowly   Complete by:  As directed       Discharge Medications     Medication List    STOP taking these medications   aspirin 81 MG tablet   diltiazem 240 MG 24 hr capsule Commonly known as:  CARDIZEM CD   naproxen sodium 220 MG tablet Commonly known as:  ALEVE     TAKE these medications   BENADRYL ALLERGY 25 MG tablet Generic drug:  diphenhydrAMINE Take 25 mg by mouth as needed (allergies).   COD LIVER OIL PO Take by mouth daily.   digoxin 0.125 MG tablet Commonly known as:  LANOXIN Take 1 tablet (0.125 mg total) by mouth daily. Start taking on:  11/20/2017   ferrous sulfate 325 (65 FE) MG tablet Take 325 mg by mouth daily with breakfast.   furosemide 40 MG tablet Commonly known as:  LASIX Take 1 tablet (40 mg total) by mouth daily.   HYDROcodone-acetaminophen 5-325 MG tablet Commonly known as:  NORCO/VICODIN Take 1 tablet by mouth every 6 (six) hours as needed for moderate pain.   levETIRAcetam 500 MG tablet Commonly known as:  KEPPRA Take 1 tablet (500  mg total) by mouth 2 (two) times daily.   loperamide 2 MG capsule Commonly known as:  IMODIUM Take 2 mg by mouth as needed for diarrhea or loose stools.   lovastatin 40 MG tablet Commonly known as:  MEVACOR Take 40 mg by mouth at bedtime.   metoprolol succinate 50 MG 24 hr tablet Commonly known as:  TOPROL-XL Take 1 tablet (50 mg total) by mouth 2 (two) times daily. Take with or immediately following a meal. What changed:    medication strength  how much to take  when to take this  additional instructions   MULTI-VITAMIN DAILY PO Take 1 tablet by mouth daily.   rivaroxaban 20 MG Tabs tablet Commonly known as:  XARELTO Take 1 tablet (20 mg total) by mouth daily with supper.   sertraline 100 MG tablet Commonly known as:  ZOLOFT Take 100 mg by mouth daily.   traZODone 150 MG tablet Commonly known as:  DESYREL Take 150 mg by mouth at bedtime.   TYLENOL 8 HOUR 650 MG CR tablet Generic drug:  acetaminophen Take 650 mg by mouth as needed.   Vitamin D 2000 units tablet Take 2,000 Units by mouth 2 (two) times daily.         Allergies Allergies  Allergen Reactions  . Adhesive [Tape] Rash     Outstanding Labs/Studies   BMET and Dig Level at Follow-up  Duration of Discharge Encounter   Greater than 30 minutes including physician time.  Signed, Richardson Dopp, PA-C  11/19/2017, 3:27 PM

## 2017-11-19 NOTE — Progress Notes (Addendum)
Progress Note  Patient Name: Ashley Pacheco Date of Encounter: 11/19/2017  Primary Cardiologist: Candee Furbish, MD   Subjective   Complains of chest and shoulder pain; no dyspnea  Inpatient Medications    Scheduled Meds: . aspirin EC  81 mg Oral Daily  . ferrous sulfate  325 mg Oral Q breakfast  . furosemide  40 mg Oral Daily  . levETIRAcetam  500 mg Oral BID  . metoprolol succinate  50 mg Oral BID  . pravastatin  20 mg Oral q1800  . rivaroxaban  20 mg Oral Q supper  . sertraline  100 mg Oral Daily  . traZODone  150 mg Oral QHS   Continuous Infusions:  PRN Meds: acetaminophen, HYDROcodone-acetaminophen, ondansetron (ZOFRAN) IV   Vital Signs    Vitals:   11/18/17 1449 11/18/17 2059 11/18/17 2124 11/19/17 0523  BP: (!) 131/99 (!) 112/91  (!) 134/102  Pulse:  (!) 102  85  Resp:    16  Temp: 98.4 F (36.9 C)  98.5 F (36.9 C) 98.4 F (36.9 C)  TempSrc: Oral  Oral Oral  SpO2: 96%   96%  Weight:    227 lb 9.6 oz (103.2 kg)  Height:       No intake or output data in the 24 hours ending 11/19/17 0642 Filed Weights   11/17/17 0442 11/18/17 0622 11/19/17 0523  Weight: 230 lb 11.2 oz (104.6 kg) 228 lb 4.8 oz (103.6 kg) 227 lb 9.6 oz (103.2 kg)    Telemetry    Atrial fibrillation; rate controlled- Personally Reviewed   Physical Exam   GEN: No acute distress.   Neck: No JVD Cardiac: irregular Respiratory: Clear to auscultation bilaterally. GI: Soft, nontender, non-distended  MS: No edema Neuro:  Nonfocal  Psych: Normal affect   Labs    Chemistry Recent Labs  Lab 11/15/17 1725  NA 144  K 3.6  CL 108  CO2 24  GLUCOSE 106*  BUN 12  CREATININE 0.97  CALCIUM 8.3*  PROT 6.1*  ALBUMIN 3.5  AST 27  ALT 19  ALKPHOS 77  BILITOT 0.5  GFRNONAA 57*  GFRAA >60  ANIONGAP 12     Hematology Recent Labs  Lab 11/15/17 1725  WBC 7.1  RBC 4.60  HGB 12.8  HCT 39.2  MCV 85.2  MCH 27.8  MCHC 32.7  RDW 13.5  PLT 170    Cardiac Enzymes Recent Labs   Lab 11/15/17 1725 11/15/17 1936 11/16/17 0214  TROPONINI <0.03 <0.03 <0.03    BNP Recent Labs  Lab 11/15/17 1725  BNP 356.4*     Patient Profile     72 y.o. female admitted with atrial fibrillation with rapid ventricular response and left arm pain.  Assessment & Plan    1 chest pain/left upper extremity pain-pain has been continuous for 1 week.  Enzymes are negative.  Doubt pain is cardiac.  She has been scheduled for a stress test later today.  If negative will discharge and follow-up with Dr. Marlou Porch.  2 atrial fibrillation-patient remains in atrial fibrillation this morning.  Plan is rate control and anticoagulation.  Continue metoprolol.  Add digoxin 0.125 mg daily.  Continue Xarelto.  DC aspirin given need for anticoagulation.  3 chronic combined systolic/diastolic congestive heart failure with ejection fraction 40-45%-continue beta-blocker.  She is euvolemic.  Continue present dose of Lasix.  4 hypertension-blood pressure controlled.  Continue present medications.    5 hyperlipidemia-continue statin.  6 valvular heart disease-prior echocardiogram showed mild to moderate  aortic stenosis and moderate mitral regurgitation.  She will need follow-up studies in the future.  If nuclear study negative will discharge later today and follow-up with Dr. Marlou Porch.  >30 min PA and physician time D2  For questions or updates, please contact [place] Please consult [email or web address] for contact info under Cardiology/STEMI.      Signed, Kirk Ruths, MD  11/19/2017, 6:42 AM

## 2017-11-19 NOTE — Progress Notes (Addendum)
  Lexiscan Myoview Pharmacologic stress portion completed. Images are currently pending. Danton Sewer, PA-C   11/19/2017 11:09 AM     11/19/2017 3:29 PM  Nm Myocar Multi W/spect Tamela Oddi Motion / Ef   Result Date: 11/19/2017  IMPRESSION: 1. No reversible ischemia or infarction. 2. Global hypokinesia. 3. Left ventricular ejection fraction 34% 4. Non invasive risk stratification*: High *2012 Appropriate Use Criteria for Coronary Revascularization Focused Update: J Am Coll Cardiol. 6333;54(5):625-638. http://content.airportbarriers.com.aspx?articleid=[email or web address] Electronically Signed   By: Corrie Mckusick D.O.   On: 11/19/2017 14:15    Reviewed with Dr. Kirk Ruths.  EF known to be low in the past.  No ischemia. Will DC home and follow up with Dr. Candee Furbish. I discussed results with patient. Richardson Dopp, PA-C    11/19/2017 3:30 PM

## 2017-11-29 ENCOUNTER — Encounter: Payer: Self-pay | Admitting: Diagnostic Neuroimaging

## 2017-12-16 ENCOUNTER — Ambulatory Visit (INDEPENDENT_AMBULATORY_CARE_PROVIDER_SITE_OTHER): Payer: Medicare Other | Admitting: Cardiology

## 2017-12-16 ENCOUNTER — Encounter: Payer: Self-pay | Admitting: Cardiology

## 2017-12-16 VITALS — BP 116/80 | HR 82 | Ht 61.5 in | Wt 228.4 lb

## 2017-12-16 DIAGNOSIS — G40909 Epilepsy, unspecified, not intractable, without status epilepticus: Secondary | ICD-10-CM | POA: Diagnosis not present

## 2017-12-16 DIAGNOSIS — Z7901 Long term (current) use of anticoagulants: Secondary | ICD-10-CM | POA: Diagnosis not present

## 2017-12-16 DIAGNOSIS — I208 Other forms of angina pectoris: Secondary | ICD-10-CM | POA: Diagnosis not present

## 2017-12-16 DIAGNOSIS — I481 Persistent atrial fibrillation: Secondary | ICD-10-CM

## 2017-12-16 DIAGNOSIS — I4819 Other persistent atrial fibrillation: Secondary | ICD-10-CM

## 2017-12-16 DIAGNOSIS — I5022 Chronic systolic (congestive) heart failure: Secondary | ICD-10-CM

## 2017-12-16 NOTE — Progress Notes (Signed)
Cardiology Office Note:    Date:  12/16/2017   ID:  Ashley Pacheco, DOB [birth year], MRN [id number]  PCP:  Leighton Ruff, MD  Cardiologist:  Candee Furbish, MD    Referring MD: Leighton Ruff, MD     History of Present Illness:    Ashley Pacheco is a 72 y.o. female with a hx of Paroxysmal atrial fibrillation with hospitalization on 02/14/17 with TEE cardioversion. TEE Echocardiogram EF 40%, may have been related to her tachycardia. Back in 2015 her echo showed normal EF and stress test was normal. She also has comorbidities of morbid obesity, seizure disorder, hypertension, hyperlipidemia.  Previous lengthy discussion about atrial fibrillation and awareness of symptoms. She did not realize that she was in atrial fibrillation at the time, asymptomatic.  11/03/17-during this visit, she was once again found to be in atrial fibrillation.  Previously successful cardioversion in April.  Asymptomatic once again.  We will continue with rate control.  Her EF was mildly reduced previously 40-45%.  She is on low-dose beta-blocker.  Also on medium dose diltiazem to help with control.  Denies any syncope bleeding orthopnea PND  12/16/17-she was admitted on 11/15/17 with atrial fibrillation rapid ventricular response in the 130s.  Electrophysiology was consulted.  Originally on IV Cardizem.  EP recommended rate control strategy initially with consideration of AV nodal ablation and pacemaker placement only if absolutely necessary.  Repeat cardioversion was not recommended as she was overall symptom-free.  She was transitioned from IV Cardizem to Lopressor and loaded with IV digoxin.  Her rates remain very difficult to control and Lopressor was increased to 50 mg twice a day.  IV dig was transitioned to 0.125 mg daily.  She was supposed to have an outpatient nuclear stress test but this was canceled because of her rapid ventricular response.  This was then performed on 11/19/17 after improved heart rate and it  showed no ischemia, EF was calculated at 34% however echocardiogram showed 40-45%.  She was ultimately discharged on metoprolol succinate 50 mg twice a day once a day digoxin 0.125 mg a day  Past Medical History:  Diagnosis Date  . 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 Atypical chest pain    a. 2015 Neg MV.  . Cardiomegaly 2005  . Cardiomyopathy ([place])    a. 01/2017 Echo: EF 45%, diff HK, mild LVH, triv AI, mild MR, mod dil LA, mildly dil RA, PASP 78mmHg - in setting of Afib.  [name] DVT (deep venous thrombosis) ([place]) 02/2004  . Fibroid   . Hypercholesteremia   . Hypertension   . Menometrorrhagia 3/92   menopausal  . Microhematuria 1/96  . Nephrolithiasis 2015  . PAF (paroxysmal atrial fibrillation) ([place])    a. 01/2017 TEE/DCCV: EF 45-50%, no LA/LAA thrombus-->successful DCCV @ 150 J. CHA2DS2VASc = 3-->Xarelto.  . Seizures ([place])    followed by [facility]- Dr. Leta Baptist  . SUI (stress urinary incontinence, female) 9/91  . Vulvar cyst 7/90    Past Surgical History:  Procedure Laterality Date  . APPENDECTOMY  1950  . BUNIONECTOMY  7/11   second toe  . CARDIOVERSION N/A 02/17/2017   Procedure: CARDIOVERSION;  Surgeon: Skeet Latch, MD;  Location: [place];  Service: Cardiovascular;  Laterality: N/A;  . cataracts  3/09 4/09  . DILATION AND CURETTAGE OF UTERUS     hystreroscopy/imcomplete polypectomy  . KNEE SURGERY  10/13 or 11/13   right knee arthroscopy  . TEE WITHOUT CARDIOVERSION N/A 02/17/2017   Procedure: TRANSESOPHAGEAL  ECHOCARDIOGRAM (TEE);  Surgeon: Skeet Latch, MD;  Location: [place];  Service: Cardiovascular;  Laterality: N/A;  . [place]  . TUBAL LIGATION  1975   BTL    Current Medications: Current Meds  Medication Sig  . acetaminophen (TYLENOL 8 HOUR) 650 MG CR tablet Take 650 mg by mouth as needed.   . Cholecalciferol (VITAMIN D) 2000 UNITS tablet Take 2,000 Units by mouth 2 (two) times daily.   . COD LIVER OIL PO Take by  mouth daily.  . digoxin (LANOXIN) 0.125 MG tablet Take 1 tablet (0.125 mg total) by mouth daily.  . diphenhydrAMINE (BENADRYL ALLERGY) 25 MG tablet Take 25 mg by mouth as needed (allergies).   . ferrous sulfate 325 (65 FE) MG tablet Take 325 mg by mouth daily with breakfast.  . furosemide (LASIX) 40 MG tablet Take 1 tablet (40 mg total) by mouth daily.  [name] HYDROcodone-acetaminophen (NORCO/VICODIN) 5-325 MG tablet Take 1 tablet by mouth every 6 (six) hours as needed for moderate pain.  [name] levETIRAcetam (KEPPRA) 500 MG tablet Take 1 tablet (500 mg total) by mouth 2 (two) times daily.  [name] loperamide (IMODIUM) 2 MG capsule Take 2 mg by mouth as needed for diarrhea or loose stools.  . lovastatin (MEVACOR) 40 MG tablet Take 40 mg by mouth at bedtime.   . metoprolol succinate (TOPROL-XL) 50 MG 24 hr tablet Take 1 tablet (50 mg total) by mouth 2 (two) times daily. Take with or immediately following a meal.  . Multiple Vitamin (MULTI-VITAMIN DAILY PO) Take 1 tablet by mouth daily.   . rivaroxaban (XARELTO) 20 MG TABS tablet Take 1 tablet (20 mg total) by mouth daily with supper.  . sertraline (ZOLOFT) 100 MG tablet Take 100 mg by mouth daily.  . traZODone (DESYREL) 150 MG tablet Take 150 mg by mouth at bedtime.      Allergies:   Adhesive [tape]   Social History   Socioeconomic History  . Marital status: Widowed    Spouse name: None  . Number of children: 2  . Years of education: 59  . Highest education level: None  Social Needs  . Financial resource strain: None  . Food insecurity - worry: None  . Food insecurity - inability: None  . Transportation needs - medical: None  . Transportation needs - non-medical: None  Occupational History    Comment: volunteer  Tobacco Use  . Smoking status: Former Smoker    Types: Cigarettes  . Smokeless tobacco: Never Used  . Tobacco comment: quit in 1969  Substance and Sexual Activity  . Alcohol use: Yes    Alcohol/week: 4.2 oz    Types: 7 Glasses of  wine per week    Comment: 1 glass of wine with dinner most nights  . Drug use: No  . Sexual activity: No    Partners: Male  Other Topics Concern  . None  Social History Narrative   Lives alone   Caffeine- coffee- 3 mugs daily     Family History: The patient's family history includes Asthma in her mother; Cancer in her father and paternal uncle; Heart attack in her maternal uncle; Heart disease in her brother and sister; Hypertension in her maternal uncle and mother; Kidney Stones in her father and mother; Skin cancer in her brother and sister; Stroke in her maternal grandfather and maternal uncle. ROS:   Please see the history of present illness.     All other systems reviewed and are negative.  EKGs/Labs/Other Studies Reviewed:    The following studies were reviewed today: Prior hospital records, office notes, lab work, echocardiogram, EKG  EKG:  03/01/17 Sinus bradycardia 57 with no other abnormalities.  Recent Labs: 11/15/2017: ALT 19; B Natriuretic Peptide 356.4; BUN 12; Creatinine, Ser 0.97; Hemoglobin 12.8; Magnesium 2.0; Platelets 170; Potassium 3.6; Sodium 144; TSH 2.339  Recent Lipid Panel No results found for: CHOL, TRIG, HDL, CHOLHDL, VLDL, LDLCALC, LDLDIRECT  Physical Exam:    VS:  BP 116/80   Pulse 82   Ht 5' 1.5" (1.562 m)   Wt 228 lb 6.4 oz (103.6 kg)   LMP 10/26/1999   SpO2 95%   BMI 42.46 kg/m     Wt Readings from Last 3 Encounters:  12/16/17 228 lb 6.4 oz (103.6 kg)  11/19/17 227 lb 9.6 oz (103.2 kg)  11/15/17 233 lb 12.8 oz (106.1 kg)     GEN: Obesity, Well nourished, well developed, in no acute distress  HEENT: normal  Neck: no JVD, carotid bruits, or masses Cardiac: irreg; 1/6 SM, no rubs, or gallops,no edema  Respiratory:  clear to auscultation bilaterally, normal work of breathing GI: soft, nontender, nondistended, + BS MS: no deformity or atrophy  Skin: warm and dry, no rash Neuro:  Alert and Oriented x 3, Strength and sensation are  intact Psych: euthymic mood, full affect   ASSESSMENT:    1. Persistent atrial fibrillation ([place])   2. Morbid obesity ([place])   3. Anticoagulated   4. Seizure disorder ([place])   5. Chronic systolic heart failure (HCC)    PLAN:    In order of problems listed above:  Angina -She did have some subscapular discomfort as well as left arm discomfort.  Potential angina.  We will check a nuclear stress test.  Back in 2015 this was low risk.  Permanent atrial fibrillation/mildly reduced ejection fraction 40-45%  - Post TEE cardioversion in April 2018, successful.  However this did not last for long.  Recently hospitalized as described above in January 2019.  Difficult to rate control.  Digoxin was added.  Diltiazem was stopped.  Metoprolol was increased.    - Continuing with anticoagulation, Xarelto  - CHADS-VASC 3 (F, HTN, Age)  - Main risk factors are age, obesity  - Repeat ECHO in July. Some fatigue may be EF related but we encouraged conditioning, weight loss, continued movement, exercise.  -Continue with Toprol as well.  Blood pressure does not allow for further increase or ACE inhibitor.  Continuing digoxin.  Chronic systolic heart failure -EF 45%.  Beta-blocker, digoxin, lisinopril.  Hopefully with better rate control, her EF will improve.  We are repeating echocardiogram in July as above.  Morbid obesity  - Continue to encourage weight loss  - Lasix was used for edema previously and help her lose some weight.  - Cane for ambulation  - some tingling in legs occasionally. Knee pain. I encouraged her to sparingly use Aleve secondary to bleeding risk.  Continue to encourage weight loss.  Encouraged daily exercise.  Consider Silver sneakers.  Essential hypertension  - Medications reviewed, excellent blood pressure today.  No dizziness.  No changes.  Seizure disorder  - neurology, Dr. Leta Baptist  - Keppra, stable  4 month f/u with Cecille Rubin, 8 months with me.  Echocardiogram to be repeated  in July 2019.   Medication Adjustments/Labs and Tests Ordered: Current medicines are reviewed at length with the patient today.  Concerns regarding medicines are outlined above.  No orders of the defined  types were placed in this encounter.  No orders of the defined types were placed in this encounter.    Signed, Candee Furbish, MD  12/16/2017 9:05 AM    [facility]

## 2017-12-16 NOTE — Patient Instructions (Signed)
Medication Instructions:  The current medical regimen is effective;  continue present plan and medications.  Testing/Procedures: Your physician has requested that you have an echocardiogram as scheduled. Echocardiography is a painless test that uses sound waves to create images of your heart. It provides your doctor with information about the size and shape of your heart and how well your heart's chambers and valves are working. This procedure takes approximately one hour. There are no restrictions for this procedure.  Follow-Up: Follow up in 4 months with Truitt Merle, NP.  You will receive a letter in the mail 2 months before you are due.  Please call us when you receive this letter to schedule your follow up appointment.  Follow up in 8 months with Dr. Marlou Porch.  You will receive a letter in the mail 2 months before you are due.  Please call us when you receive this letter to schedule your follow up appointment.  If you need a refill on your cardiac medications before your next appointment, please call your pharmacy.  Thank you for choosing [facility]!!

## 2018-01-24 ENCOUNTER — Ambulatory Visit (INDEPENDENT_AMBULATORY_CARE_PROVIDER_SITE_OTHER): Payer: Medicare Other | Admitting: Obstetrics & Gynecology

## 2018-01-24 ENCOUNTER — Other Ambulatory Visit (HOSPITAL_COMMUNITY)
Admission: RE | Admit: 2018-01-24 | Discharge: 2018-01-24 | Disposition: A | Discharge status: Home / Self Care | Payer: Medicare Other | Source: Ambulatory Visit | Attending: Obstetrics & Gynecology | Admitting: Obstetrics & Gynecology

## 2018-01-24 ENCOUNTER — Other Ambulatory Visit: Payer: Self-pay

## 2018-01-24 ENCOUNTER — Encounter: Payer: Self-pay | Admitting: Obstetrics & Gynecology

## 2018-01-24 VITALS — BP 118/86 | HR 72 | Resp 16 | Ht 61.5 in | Wt 227.0 lb

## 2018-01-24 DIAGNOSIS — Z124 Encounter for screening for malignant neoplasm of cervix: Secondary | ICD-10-CM | POA: Insufficient documentation

## 2018-01-24 DIAGNOSIS — Z01419 Encounter for gynecological examination (general) (routine) without abnormal findings: Secondary | ICD-10-CM

## 2018-01-24 NOTE — Progress Notes (Signed)
72 y.o. G2P2 WidowedCaucasianF here for annual exam.  Having issues with afib.  Has been hospitalized twice since I saw her.  She has considered cardiac ablation with a pacemaker.  EF was mildly decreased.    Denies vaginal bleeding.   PCP:  Dr. Drema Dallas.  Next appt is in the summer.    Patient's last menstrual period was 10/26/1999.          Sexually active: No.  The current method of family planning is tubal ligation and post menopausal.    Exercising: No.   Smoker:  no  Health Maintenance: Pap:  06/27/15 Neg  History of abnormal Pap:  no MMG:  07/20/17 BIRADS1:neg  Colonoscopy:  2013 f/u 5 years.  Aware this is due.  Dr. Collene Mares.  She will schedule this.   BMD:   07/20/17 Osteopenia  TDaP:  Current  Pneumonia vaccine(s): Done with PCP Shingrix:   Done with PCP Hep C testing: Done with PCP: Neg Screening Labs: PCP   reports that she has quit smoking. Her smoking use included cigarettes. She has never used smokeless tobacco. She reports that she drinks about 4.2 oz of alcohol per week. She reports that she does not use drugs.  Past Medical History:  Diagnosis Date  . 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 Atrial fibrillation ([place])   . Atypical chest pain    a. 2015 Neg MV.  . Cardiomegaly 2005  . Cardiomyopathy ([place])    a. 01/2017 Echo: EF 45%, diff HK, mild LVH, triv AI, mild MR, mod dil LA, mildly dil RA, PASP 8mmHg - in setting of Afib.  [name] DVT (deep venous thrombosis) ([place]) 02/2004  . Fibroid   . Hypercholesteremia   . Hypertension   . Menometrorrhagia 3/92   menopausal  . Microhematuria 1/96  . Nephrolithiasis 2015  . PAF (paroxysmal atrial fibrillation) ([place])    a. 01/2017 TEE/DCCV: EF 45-50%, no LA/LAA thrombus-->successful DCCV @ 150 J. CHA2DS2VASc = 3-->Xarelto.  . Seizures ([place])    followed by [facility]- Dr. Leta Baptist  . SUI (stress urinary incontinence, female) 9/91  . Vulvar cyst 7/90    Past Surgical History:  Procedure Laterality Date  . APPENDECTOMY  1950  .  BUNIONECTOMY  7/11   second toe  . CARDIOVERSION N/A 02/17/2017   Procedure: CARDIOVERSION;  Surgeon: Skeet Latch, MD;  Location: [place];  Service: Cardiovascular;  Laterality: N/A;  . cataracts  3/09 4/09  . DILATION AND CURETTAGE OF UTERUS     hystreroscopy/imcomplete polypectomy  . KNEE SURGERY  10/13 or 11/13   right knee arthroscopy  . TEE WITHOUT CARDIOVERSION N/A 02/17/2017   Procedure: TRANSESOPHAGEAL ECHOCARDIOGRAM (TEE);  Surgeon: Skeet Latch, MD;  Location: [place];  Service: Cardiovascular;  Laterality: N/A;  . [place]  . TUBAL LIGATION  1975   BTL    Current Outpatient Medications  Medication Sig Dispense Refill  . acetaminophen (TYLENOL 8 HOUR) 650 MG CR tablet Take 650 mg by mouth as needed.     [name] aspirin 81 MG chewable tablet Chew 81 mg by mouth daily.    . Cholecalciferol (VITAMIN D) 2000 UNITS tablet Take 2,000 Units by mouth 2 (two) times daily.     . COD LIVER OIL PO Take by mouth daily.    . digoxin (LANOXIN) 0.125 MG tablet Take 1 tablet (0.125 mg total) by mouth daily. 30 tablet 11  . diphenhydrAMINE (BENADRYL ALLERGY) 25 MG tablet Take 25 mg by mouth  as needed (allergies).     . ferrous sulfate 325 (65 FE) MG tablet Take 325 mg by mouth daily with breakfast.    . furosemide (LASIX) 40 MG tablet Take 1 tablet (40 mg total) by mouth daily. 90 tablet 1  . HYDROcodone-acetaminophen (NORCO/VICODIN) 5-325 MG tablet Take 1 tablet by mouth every 6 (six) hours as needed for moderate pain.    [name] levETIRAcetam (KEPPRA) 500 MG tablet Take 1 tablet (500 mg total) by mouth 2 (two) times daily. 180 tablet 4  . loperamide (IMODIUM) 2 MG capsule Take 2 mg by mouth as needed for diarrhea or loose stools.    . lovastatin (MEVACOR) 40 MG tablet Take 40 mg by mouth at bedtime.     . metoprolol succinate (TOPROL-XL) 50 MG 24 hr tablet Take 1 tablet (50 mg total) by mouth 2 (two) times daily. Take with or immediately following a meal. 60  tablet 11  . Multiple Vitamin (MULTI-VITAMIN DAILY PO) Take 1 tablet by mouth daily.     . rivaroxaban (XARELTO) 20 MG TABS tablet Take 1 tablet (20 mg total) by mouth daily with supper. 30 tablet 6  . sertraline (ZOLOFT) 100 MG tablet Take 100 mg by mouth daily.    . traZODone (DESYREL) 150 MG tablet Take 150 mg by mouth at bedtime.      No current facility-administered medications for this visit.     Family History  Problem Relation Age of Onset  . Cancer Father        stomach and prostate  . Kidney Stones Father   . Hypertension Mother   . Asthma Mother   . Kidney Stones Mother   . Stroke Maternal Uncle   . Heart attack Maternal Uncle   . Hypertension Maternal Uncle   . Cancer Paternal Uncle   . Stroke Maternal Grandfather   . Heart disease Sister        Rapid rhythm  . Skin cancer Sister   . Heart disease Brother        Rapid rhythm  . Skin cancer Brother     Review of Systems  All other systems reviewed and are negative.   Exam:   BP 118/86 (BP Location: Left Arm, Patient Position: Sitting, Cuff Size: Large)   Pulse 72   Resp 16   Ht 5' 1.5" (1.562 m)   Wt 227 lb (103 kg)   LMP 10/26/1999   BMI 42.20 kg/m    Height: 5' 1.5" (156.2 cm)  Ht Readings from Last 3 Encounters:  01/24/18 5' 1.5" (1.562 m)  12/16/17 5' 1.5" (1.562 m)  11/15/17 5' 1.5" (1.562 m)    General appearance: alert, cooperative and appears stated age Head: Normocephalic, without obvious abnormality, atraumatic Neck: no adenopathy, supple, symmetrical, trachea midline and thyroid normal to inspection and palpation Lungs: clear to auscultation bilaterally Breasts: normal appearance, no masses or tenderness Heart: regular rate and rhythm Abdomen: soft, non-tender; bowel sounds normal; no masses,  no organomegaly Extremities: extremities normal, atraumatic, no cyanosis or edema Skin: Skin color, texture, turgor normal. No rashes or lesions Lymph nodes: Cervical, supraclavicular, and axillary  nodes normal. No abnormal inguinal nodes palpated Neurologic: Grossly normal   Pelvic: External genitalia:  no lesions              Urethra:  normal appearing urethra with no masses, tenderness or lesions              Bartholins and Skenes: normal  Vagina: normal appearing vagina with normal color and discharge, no lesions              Cervix: no lesions              Pap taken: Yes.   Bimanual Exam:  Uterus:  normal size, contour, position, consistency, mobility, non-tender              Adnexa: normal adnexa and no mass, fullness, tenderness               Rectovaginal: Confirms               Anus:  normal sphincter tone, no lesions  Chaperone was present for exam.  A:  Well Woman with normal exam PMP, no HRT Obesity H/o DVT Afib Hypertension Episodic diarrhea Multiple sebaceous cysts on the vulva  P:   Mammogram guidelines reviewed.  UTD Pap obtained today Lab work done with Dr. Drema Dallas yearly although has had blood work done with hospitalization this year.  Reviewed with pt today. Aware colonoscopy is due.  H/O polyps.  Is going to follow up with Dr. Collene Mares.  Possibly cologuard can be done due to additional risks for this pt with colonoscopy.  Does not want my office to schedule appt for her Reports vaccines are UTD.  I do not have documentation for this as is with Dr. Drema Dallas.   AEX 1 year or follow up prn.

## 2018-01-25 LAB — CYTOLOGY - PAP: Diagnosis: NEGATIVE

## 2018-03-02 ENCOUNTER — Other Ambulatory Visit: Payer: Self-pay | Admitting: Cardiology

## 2018-04-04 ENCOUNTER — Other Ambulatory Visit: Payer: Self-pay | Admitting: Cardiology

## 2018-04-04 NOTE — Telephone Encounter (Signed)
CrCl 85.24 Refill remains same for Xarelto 20 mg daily

## 2018-04-04 NOTE — Telephone Encounter (Signed)
Age 72 years Wt 103kg 01/24/2018 Saw Dr Marlou Porch  12/16/2017 11/15/2017 SrCr 0.97 Hgb 12.8 HCT 39.2  CrCl 100.2 Refill done for Xarelto 20 mg daily as requested

## 2018-04-07 ENCOUNTER — Ambulatory Visit: Payer: Medicare Other | Admitting: Diagnostic Neuroimaging

## 2018-04-09 ENCOUNTER — Other Ambulatory Visit: Payer: Self-pay | Admitting: Physician Assistant

## 2018-04-19 ENCOUNTER — Ambulatory Visit (INDEPENDENT_AMBULATORY_CARE_PROVIDER_SITE_OTHER): Payer: Medicare Other | Admitting: Nurse Practitioner

## 2018-04-19 ENCOUNTER — Encounter: Payer: Self-pay | Admitting: Nurse Practitioner

## 2018-04-19 ENCOUNTER — Other Ambulatory Visit: Payer: Self-pay | Admitting: *Deleted

## 2018-04-19 VITALS — BP 118/80 | HR 92 | Ht 61.5 in | Wt 223.0 lb

## 2018-04-19 DIAGNOSIS — I208 Other forms of angina pectoris: Secondary | ICD-10-CM

## 2018-04-19 DIAGNOSIS — E7849 Other hyperlipidemia: Secondary | ICD-10-CM | POA: Diagnosis not present

## 2018-04-19 DIAGNOSIS — Z79899 Other long term (current) drug therapy: Secondary | ICD-10-CM

## 2018-04-19 DIAGNOSIS — I4819 Other persistent atrial fibrillation: Secondary | ICD-10-CM

## 2018-04-19 DIAGNOSIS — I4891 Unspecified atrial fibrillation: Secondary | ICD-10-CM

## 2018-04-19 DIAGNOSIS — I481 Persistent atrial fibrillation: Secondary | ICD-10-CM | POA: Diagnosis not present

## 2018-04-19 LAB — BASIC METABOLIC PANEL
BUN/Creatinine Ratio: 11 — ABNORMAL LOW (ref 12–28)
BUN: 10 mg/dL (ref 8–27)
CO2: 27 mmol/L (ref 20–29)
Calcium: 8.8 mg/dL (ref 8.7–10.3)
Chloride: 104 mmol/L (ref 96–106)
Creatinine, Ser: 0.88 mg/dL (ref 0.57–1.00)
GFR calc Af Amer: 76 mL/min/{1.73_m2} (ref >59)
GFR calc non Af Amer: 66 mL/min/{1.73_m2} (ref >59)
Glucose: 108 mg/dL — ABNORMAL HIGH (ref 65–99)
Potassium: 4 mmol/L (ref 3.5–5.2)
Sodium: 144 mmol/L (ref 134–144)

## 2018-04-19 LAB — CBC
Hematocrit: 40.4 % (ref 34.0–46.6)
Hemoglobin: 13.4 g/dL (ref 11.1–15.9)
MCH: 27.3 pg (ref 26.6–33.0)
MCHC: 33.2 g/dL (ref 31.5–35.7)
MCV: 82 fL (ref 79–97)
Platelets: 194 10*3/uL (ref 150–450)
RBC: 4.9 x10E6/uL (ref 3.77–5.28)
RDW: 14 % (ref 12.3–15.4)
WBC: 7.9 10*3/uL (ref 3.4–10.8)

## 2018-04-19 LAB — HEPATIC FUNCTION PANEL
ALT: 13 IU/L (ref 0–32)
AST: 14 IU/L (ref 0–40)
Albumin: 3.9 g/dL (ref 3.5–4.8)
Alkaline Phosphatase: 83 IU/L (ref 39–117)
Bilirubin Total: 0.4 mg/dL (ref 0.0–1.2)
Bilirubin, Direct: 0.14 mg/dL (ref 0.00–0.40)
Total Protein: 6 g/dL (ref 6.0–8.5)

## 2018-04-19 LAB — DIGOXIN LEVEL: Digoxin, Serum: 1.1 ng/mL — ABNORMAL HIGH (ref 0.5–0.9)

## 2018-04-19 LAB — TSH: TSH: 1.94 u[IU]/mL (ref 0.450–4.500)

## 2018-04-19 LAB — LIPID PANEL
Chol/HDL Ratio: 4.7 ratio — ABNORMAL HIGH (ref 0.0–4.4)
Cholesterol, Total: 166 mg/dL (ref 100–199)
HDL: 35 mg/dL — ABNORMAL LOW (ref >39)
LDL Calculated: 103 mg/dL — ABNORMAL HIGH (ref 0–99)
Triglycerides: 140 mg/dL (ref 0–149)
VLDL Cholesterol Cal: 28 mg/dL (ref 5–40)

## 2018-04-19 MED ORDER — METOPROLOL SUCCINATE ER 50 MG PO TB24: 50.0000 mg | ORAL_TABLET | Freq: Two times a day (BID) | ORAL | 3 refills | Status: DC

## 2018-04-19 MED ORDER — RIVAROXABAN 20 MG PO TABS: ORAL_TABLET | ORAL | 3 refills | Status: DC

## 2018-04-19 NOTE — Patient Instructions (Signed)
We will be checking the following labs today - BMET, CBC, HPF, Lipids, Dig level and TSh   Medication Instructions:    Continue with your current medicines.   I sent in your #90 refills for Metoprolol and Xarelto    Testing/Procedures To Be Arranged:  Keep echocardiogram appointment for next month  Follow-Up:   See Dr.Skains in 4 months    Other Special Instructions:   N/A    If you need a refill on your cardiac medications before your next appointment, please call your pharmacy.   Call the [place] office at [phone] if you have any questions, problems or concerns.

## 2018-04-19 NOTE — Progress Notes (Signed)
CARDIOLOGY OFFICE NOTE  Date:  04/19/2018    Ashley Pacheco Date of Birth: 1946/08/30 Medical Record #[id number]  PCP:  Leighton Ruff, MD  Cardiologist:  Marisa Cyphers    Chief Complaint  Patient presents with  . Atrial Fibrillation  . Shortness of Breath  . Congestive Heart Failure    4 month check. Seen for Dr. Marlou Porch    History of Present Illness: Ashley Pacheco is a 72 y.o. female who presents today for a 4 month check. Seen for Dr. Marlou Porch.   She has a history of PAF with prior TEE cardioversion in 01/2017. EF 40% at that time. EF in 2015 had been normal. Other issues include morbid obesity, seizure disorder, hypertension, hyperlipidemia.  Seen earlier this year - found to be in AF - was asymptomatic. Rate control was opted for. EF mildly reduced at 40 to 45%. Was then admitted with RVR - EP consulted. EP recommended rate control strategy initially with consideration of AV nodal ablation and pacemaker placement only if absolutely necessary.  Repeat cardioversion was not recommended as she was overall symptom-free.  She was transitioned from IV Cardizem to Lopressor and loaded with IV digoxin.  Her rates remained very difficult to control and Lopressor was increased to 50 mg twice a day.  IV dig was transitioned to 0.125 mg daily.  She was supposed to have an outpatient nuclear stress test but this was canceled because of her rapid ventricular response. This was then performed on 11/19/17 after improved heart rate and it showed no ischemia, EF was calculated at 34% however echocardiogram showed 40-45%.  She was ultimately discharged on metoprolol succinate 50 mg twice a day once a day digoxin 0.125 mg. She is managed as permanent AF with rate control and anticoagulation.   Last seen in February by Dr. Marlou Porch.   Comes in today. Here alone. She notes that she stays pretty fatigued and short of breath. She feels this is unchanged. She has no awareness of her AF. She takes 2  naps a day and still is able to sleep 8 to 9 hours at night. Very sedentary and does not plan on doing any activity that would cause her knees to hurt. No actual chest pain. Tolerating her medicines. Took her Digoxin around 7:30 this morning. She is to have a repeat echo in about a month to recheck her EF.   Past Medical History:  Diagnosis Date  . Aortic stenosis 05/18/2017  . 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 Atrial fibrillation ([place])   . Atrial fibrillation with rapid ventricular response ([place]) 02/14/2017  . Atrial fibrillation with RVR ([place]) 11/15/2017  . Atypical chest pain    a. 2015 Neg MV.  . Cardiomegaly 2005  . Cardiomyopathy ([place])    a. 01/2017 Echo: EF 45%, diff HK, mild LVH, triv AI, mild MR, mod dil LA, mildly dil RA, PASP 96mmHg - in setting of Afib.  [name] DVT (deep venous thrombosis) ([place]) 02/2004  . Fibroid   . Hypercholesteremia   . Hypertension   . Menometrorrhagia 3/92   menopausal  . Microhematuria 1/96  . Mitral regurgitation 05/18/2017  . Nephrolithiasis 2015  . PAF (paroxysmal atrial fibrillation) ([place])    a. 01/2017 TEE/DCCV: EF 45-50%, no LA/LAA thrombus-->successful DCCV @ 150 J. CHA2DS2VASc = 3-->Xarelto.  . Seizure disorder ([place]) 02/17/2017  . Seizures ([place])    followed by [facility]- Dr. Leta Baptist  . SUI (stress urinary incontinence, female) 9/91  .  Vulvar cyst 7/90    Past Surgical History:  Procedure Laterality Date  . APPENDECTOMY  1950  . BUNIONECTOMY  7/11   second toe  . CARDIOVERSION N/A 02/17/2017   Procedure: CARDIOVERSION;  Surgeon: Skeet Latch, MD;  Location: [place];  Service: Cardiovascular;  Laterality: N/A;  . cataracts  3/09 4/09  . DILATION AND CURETTAGE OF UTERUS     hystreroscopy/imcomplete polypectomy  . KNEE SURGERY  10/13 or 11/13   right knee arthroscopy  . TEE WITHOUT CARDIOVERSION N/A 02/17/2017   Procedure: TRANSESOPHAGEAL ECHOCARDIOGRAM (TEE);  Surgeon: Skeet Latch, MD;  Location: [place];  Service:  Cardiovascular;  Laterality: N/A;  . [place]  . TUBAL LIGATION  1975   BTL     Medications: Current Meds  Medication Sig  . acetaminophen (TYLENOL 8 HOUR) 650 MG CR tablet Take 650 mg by mouth as needed for pain.   . Cholecalciferol (VITAMIN D) 2000 UNITS tablet Take 2,000 Units by mouth 2 (two) times daily.   . COD LIVER OIL PO Take by mouth daily.  . digoxin (LANOXIN) 0.125 MG tablet Take 1 tablet (0.125 mg total) by mouth daily.  . diphenhydrAMINE (BENADRYL ALLERGY) 25 MG tablet Take 25 mg by mouth as needed (allergies).   . ferrous sulfate 325 (65 FE) MG tablet Take 325 mg by mouth daily with breakfast.  . furosemide (LASIX) 40 MG tablet TAKE 1 TABLET BY MOUTH EVERY DAY  . HYDROcodone-acetaminophen (NORCO/VICODIN) 5-325 MG tablet Take 1 tablet by mouth every 6 (six) hours as needed for moderate pain.  [name] levETIRAcetam (KEPPRA) 500 MG tablet Take 1 tablet (500 mg total) by mouth 2 (two) times daily.  [name] loperamide (IMODIUM) 2 MG capsule Take 2 mg by mouth as needed for diarrhea or loose stools.  . lovastatin (MEVACOR) 40 MG tablet Take 40 mg by mouth at bedtime.   . metoprolol succinate (TOPROL-XL) 50 MG 24 hr tablet Take 1 tablet (50 mg total) by mouth 2 (two) times daily. Take with or immediately following a meal.  . Multiple Vitamin (MULTI-VITAMIN DAILY PO) Take 1 tablet by mouth daily.   . rivaroxaban (XARELTO) 20 MG TABS tablet TAKE 1 TABLET BY MOUTH DAILY WITH SUPPER.  . sertraline (ZOLOFT) 100 MG tablet Take 100 mg by mouth daily.  . traZODone (DESYREL) 150 MG tablet Take 150 mg by mouth at bedtime.   . [DISCONTINUED] metoprolol succinate (TOPROL-XL) 50 MG 24 hr tablet Take 1 tablet (50 mg total) by mouth 2 (two) times daily. Take with or immediately following a meal.  . [DISCONTINUED] naproxen sodium (ALEVE) 220 MG tablet Take 220 mg by mouth as needed (knee pain).  . [DISCONTINUED] XARELTO 20 MG TABS tablet TAKE 1 TABLET BY MOUTH DAILY WITH SUPPER.       Allergies: Allergies  Allergen Reactions  . Adhesive [Tape] Rash    Social History: The patient  reports that she has quit smoking. Her smoking use included cigarettes. She has never used smokeless tobacco. She reports that she drinks about 4.2 oz of alcohol per week. She reports that she does not use drugs.   Family History: The patient's family history includes Asthma in her mother; Cancer in her father and paternal uncle; Heart attack in her maternal uncle; Heart disease in her brother and sister; Hypertension in her maternal uncle and mother; Kidney Stones in her father and mother; Skin cancer in her brother and sister; Stroke in her maternal grandfather and maternal uncle.  Review of Systems: Please see the history of present illness.   Otherwise, the review of systems is positive for none.   All other systems are reviewed and negative.   Physical Exam: VS:  BP 118/80 (BP Location: Left Arm, Patient Position: Sitting, Cuff Size: Large)   Pulse 92   Ht 5' 1.5" (1.562 m)   Wt 223 lb (101.2 kg)   LMP 10/26/1999   SpO2 99%   BMI 41.45 kg/m  .  BMI Body mass index is 41.45 kg/m.  Wt Readings from Last 3 Encounters:  04/19/18 223 lb (101.2 kg)  01/24/18 227 lb (103 kg)  12/16/17 228 lb 6.4 oz (103.6 kg)    General: Pleasant. Obese. Alert and in no acute distress.   HEENT: Normal.  Neck: Supple, no JVD, carotid bruits, or masses noted.  Cardiac: Irregular irregular rhythm. Rate is ok.  HR is 76 by me.  No significant edema.  Respiratory:  Lungs are clear to auscultation bilaterally with normal work of breathing.  GI: Soft and nontender.  MS: No deformity or atrophy. Gait and ROM intact.  Skin: Warm and dry. Color is normal.  Neuro:  Strength and sensation are intact and no gross focal deficits noted.  Psych: Alert, appropriate and with normal affect.   LABORATORY DATA:  EKG:  EKG is not ordered today.  Lab Results  Component Value Date   WBC 7.1 11/15/2017    HGB 12.8 11/15/2017   HCT 39.2 11/15/2017   PLT 170 11/15/2017   GLUCOSE 106 (H) 11/15/2017   ALT 19 11/15/2017   AST 27 11/15/2017   NA 144 11/15/2017   K 3.6 11/15/2017   CL 108 11/15/2017   CREATININE 0.97 11/15/2017   BUN 12 11/15/2017   CO2 24 11/15/2017   TSH 2.339 11/15/2017   INR 3.13 11/15/2017   HGBA1C 5.5 11/15/2017     BNP (last 3 results) Recent Labs    11/15/17 1725  BNP 356.4*    ProBNP (last 3 results) No results for input(s): PROBNP in the last 8760 hours.   Other Studies Reviewed Today:  Echo Study Conclusions 04/2017  - Left ventricle: The cavity size was mildly dilated. Wall thickness was normal. Systolic function was mildly to moderately reduced. The estimated ejection fraction was in the range of 40% to 45%. Diffuse hypokinesis. Features are consistent with a pseudonormal left ventricular filling pattern, with concomitant abnormal relaxation and increased filling pressure (grade 2 diastolic dysfunction). - Aortic valve: Transvalvular velocity was increased less than expected, due to low cardiac output. There was mild to moderate stenosis. There was trivial regurgitation. Valve area (VTI): 1.54 cm^2. Valve area (Vmean): 1.5 cm^2. - Mitral valve: There was moderate regurgitation directed centrally. - Left atrium: The atrium was moderately to severely dilated. - Pulmonary arteries: Systolic pressure was mildly increased. PA peak pressure: 36 mm Hg (S).   Assessment/Plan:  1. Permanent Af - her rate is controlled. She remains on anticoagulation.   2. Chronic anticoagulation - lab today. No obvious problems noted. Denies bleeding. No falls.   3. Chronic systolic HF - to have repeat echo next month - her breathing seems stable. I suspect most of her fatigue/shortness of breath is related to sedentary activity/obesity, etc. Lab today.   4. Morbid obesity - activity is encouraged - but she has no plans to do any type of  activity that would cause her knees to hurt. Unfortunately, I do not see this changing.   5. Seizure disorder -  not discussed.   6. High risk medicine - lab today.   Current medicines are reviewed with the patient today.  The patient does not have concerns regarding medicines other than what has been noted above.  The following changes have been made:  See above.  Labs/ tests ordered today include:    Orders Placed This Encounter  Procedures  . Basic metabolic panel  . CBC  . Hepatic function panel  . Digoxin level  . Lipid panel  . TSH     Disposition:   FU with Dr. Marlou Porch in 4 months.    Patient is agreeable to this plan and will call if any problems develop in the interim.   SignedTruitt Merle, NP  04/19/2018 10:26 AM  [facility] [address] [place], [place]  [address] Phone: [phone] Fax: [phone]

## 2018-04-20 ENCOUNTER — Other Ambulatory Visit: Payer: Medicare Other

## 2018-04-21 ENCOUNTER — Other Ambulatory Visit: Payer: Medicare Other | Admitting: *Deleted

## 2018-04-21 DIAGNOSIS — I4891 Unspecified atrial fibrillation: Secondary | ICD-10-CM | POA: Diagnosis not present

## 2018-04-21 LAB — DIGOXIN LEVEL: Digoxin, Serum: 0.6 ng/mL (ref 0.5–0.9)

## 2018-05-09 DIAGNOSIS — Z0000 Encounter for general adult medical examination without abnormal findings: Secondary | ICD-10-CM | POA: Diagnosis not present

## 2018-05-09 DIAGNOSIS — I481 Persistent atrial fibrillation: Secondary | ICD-10-CM | POA: Diagnosis not present

## 2018-05-09 DIAGNOSIS — L659 Nonscarring hair loss, unspecified: Secondary | ICD-10-CM | POA: Diagnosis not present

## 2018-05-09 DIAGNOSIS — E7800 Pure hypercholesterolemia, unspecified: Secondary | ICD-10-CM | POA: Diagnosis not present

## 2018-05-09 DIAGNOSIS — F339 Major depressive disorder, recurrent, unspecified: Secondary | ICD-10-CM | POA: Diagnosis not present

## 2018-05-09 DIAGNOSIS — M170 Bilateral primary osteoarthritis of knee: Secondary | ICD-10-CM | POA: Diagnosis not present

## 2018-05-09 DIAGNOSIS — Z6841 Body Mass Index (BMI) 40.0 and over, adult: Secondary | ICD-10-CM | POA: Diagnosis not present

## 2018-05-09 DIAGNOSIS — E559 Vitamin D deficiency, unspecified: Secondary | ICD-10-CM | POA: Diagnosis not present

## 2018-05-09 DIAGNOSIS — I10 Essential (primary) hypertension: Secondary | ICD-10-CM | POA: Diagnosis not present

## 2018-05-09 DIAGNOSIS — Z136 Encounter for screening for cardiovascular disorders: Secondary | ICD-10-CM | POA: Diagnosis not present

## 2018-05-09 DIAGNOSIS — M8588 Other specified disorders of bone density and structure, other site: Secondary | ICD-10-CM | POA: Diagnosis not present

## 2018-05-17 ENCOUNTER — Ambulatory Visit: Payer: Medicare Other | Admitting: Diagnostic Neuroimaging

## 2018-05-17 ENCOUNTER — Other Ambulatory Visit: Payer: Self-pay

## 2018-05-17 ENCOUNTER — Ambulatory Visit (HOSPITAL_COMMUNITY): Payer: Medicare Other | Attending: Internal Medicine

## 2018-05-17 DIAGNOSIS — I350 Nonrheumatic aortic (valve) stenosis: Secondary | ICD-10-CM | POA: Diagnosis not present

## 2018-05-17 DIAGNOSIS — I4891 Unspecified atrial fibrillation: Secondary | ICD-10-CM | POA: Insufficient documentation

## 2018-05-17 DIAGNOSIS — I340 Nonrheumatic mitral (valve) insufficiency: Secondary | ICD-10-CM | POA: Diagnosis not present

## 2018-05-17 DIAGNOSIS — I082 Rheumatic disorders of both aortic and tricuspid valves: Secondary | ICD-10-CM | POA: Diagnosis not present

## 2018-05-17 DIAGNOSIS — I119 Hypertensive heart disease without heart failure: Secondary | ICD-10-CM | POA: Insufficient documentation

## 2018-05-17 DIAGNOSIS — E785 Hyperlipidemia, unspecified: Secondary | ICD-10-CM | POA: Diagnosis not present

## 2018-05-18 DIAGNOSIS — M1712 Unilateral primary osteoarthritis, left knee: Secondary | ICD-10-CM | POA: Diagnosis not present

## 2018-05-18 DIAGNOSIS — M1711 Unilateral primary osteoarthritis, right knee: Secondary | ICD-10-CM | POA: Diagnosis not present

## 2018-05-24 ENCOUNTER — Encounter: Payer: Self-pay | Admitting: Diagnostic Neuroimaging

## 2018-05-24 ENCOUNTER — Ambulatory Visit (INDEPENDENT_AMBULATORY_CARE_PROVIDER_SITE_OTHER): Payer: Medicare Other | Admitting: Diagnostic Neuroimaging

## 2018-05-24 VITALS — BP 122/84 | HR 43 | Ht 61.5 in | Wt 218.2 lb

## 2018-05-24 DIAGNOSIS — G40909 Epilepsy, unspecified, not intractable, without status epilepticus: Secondary | ICD-10-CM | POA: Diagnosis not present

## 2018-05-24 DIAGNOSIS — I208 Other forms of angina pectoris: Secondary | ICD-10-CM

## 2018-05-24 DIAGNOSIS — G4489 Other headache syndrome: Secondary | ICD-10-CM

## 2018-05-24 MED ORDER — LEVETIRACETAM 500 MG PO TABS: 500.0000 mg | ORAL_TABLET | Freq: Two times a day (BID) | ORAL | 4 refills | Status: DC

## 2018-05-24 NOTE — Progress Notes (Signed)
[facility]  PATIENT: Ashley Pacheco DOB: October 09, 1946  REFERRING CLINICIAN: Edwin Dada HISTORY FROM: patient  REASON FOR VISIT: follow up   HISTORICAL  CHIEF COMPLAINT:  Chief Complaint  Patient presents with  . Follow-up  . Seizures    no seizures, tolerating keppra well.      HISTORY OF PRESENT ILLNESS:   UPDATE (05/24/18, VRP): Since last visit, doing well. No seizures. However, now with new HA x 2-3 months, 2 per week, occipital. No other sxs. No similar HA in the past. Severity is mild. No alleviating or aggravating factors. Tolerating levetiracetam.    UPDATE 04/01/17: Since last visit, was in [place] in April 2018, dx'd with afib and RVR, now on xarelto and diltiazem. Now doing well. No seizures. Tolerating LEV.   UPDATE 10/01/16: Since last visit, doing well. No sz. Tolerating LEV. No other abnl spells. She has some snoring, but no sig daytime fatigue. She is concerned about risk of stroke due to her family history.   PRIOR HPI (08/13/16): 72 year old right-handed female here for evaluation of possible seizure. Patient has history of hypertension, hyperglycemia depression and heart flutter. 08/10/16, patient was at home playing solitaire on her tablet. She was sitting in a lounge chair where she sometimes takes a nap in the afternoon. Around 2 PM patient noticed that this screen on the tablet was "wavy". Patient looked away from the tablet and saw an afterimage of the tablet follow her visual tracking to the right side. Patient then tried to use the TV remote to turn off the television. She tried pressing a few buttons was unable to turn off the TV. The next thing she remembered she was waking up, noticed that the TV remote was on the floor and the television was still on. She then noticed that her tongue and throat were very sore. She got up and looked in the mirror she found that she had severely bitten her tongue on both sides. No incontinence. No definite postictal  confusion. She did not feel sore in her arms or legs. In retrospect patient had a similar but minor event in spring of 2017 where she was using her tablet and saw a "wavy screen" lasting for approximately 1 minute. She did not have tongue biting or any other symptoms with that episode. Patient has normal worth and development mental history. No history of seizure in the past. No head trauma, meningitis or encephalitis in the past. No family history of seizure. Patient lives alone, is fairly active, takes care of all ADLs. No recent change in medication, alcohol, sleep, stress or other events.   REVIEW OF SYSTEMS: Full 14 system review of systems performed and negative with exception of: headache SOB fatigue walking diff.   ALLERGIES: Allergies  Allergen Reactions  . Adhesive [Tape] Rash    HOME MEDICATIONS: Outpatient Medications Prior to Visit  Medication Sig Dispense Refill  . acetaminophen (TYLENOL 8 HOUR) 650 MG CR tablet Take 650 mg by mouth as needed for pain.     . Cholecalciferol (VITAMIN D) 2000 UNITS tablet Take 2,000 Units by mouth 2 (two) times daily.     . COD LIVER OIL PO Take by mouth daily.    . digoxin (LANOXIN) 0.125 MG tablet Take 1 tablet (0.125 mg total) by mouth daily. 90 tablet 3  . diphenhydrAMINE (BENADRYL ALLERGY) 25 MG tablet Take 25 mg by mouth as needed (allergies).     . ferrous sulfate 325 (65 FE) MG tablet Take 325  mg by mouth daily with breakfast.    . furosemide (LASIX) 40 MG tablet TAKE 1 TABLET BY MOUTH EVERY DAY 90 tablet 3  . HYDROcodone-acetaminophen (NORCO/VICODIN) 5-325 MG tablet Take 1 tablet by mouth every 6 (six) hours as needed for moderate pain.    [name] levETIRAcetam (KEPPRA) 500 MG tablet Take 1 tablet (500 mg total) by mouth 2 (two) times daily. 180 tablet 4  . loperamide (IMODIUM) 2 MG capsule Take 2 mg by mouth as needed for diarrhea or loose stools.    . lovastatin (MEVACOR) 40 MG tablet Take 40 mg by mouth every morning.     . metoprolol  succinate (TOPROL-XL) 50 MG 24 hr tablet Take 1 tablet (50 mg total) by mouth 2 (two) times daily. Take with or immediately following a meal. (Patient taking differently: Take 50 mg by mouth daily. Take with or immediately following a meal.) 180 tablet 3  . Multiple Vitamin (MULTI-VITAMIN DAILY PO) Take 1 tablet by mouth daily.     . rivaroxaban (XARELTO) 20 MG TABS tablet TAKE 1 TABLET BY MOUTH DAILY WITH SUPPER. 90 tablet 3  . sertraline (ZOLOFT) 100 MG tablet Take 100 mg by mouth daily.    . traZODone (DESYREL) 150 MG tablet Take 150 mg by mouth at bedtime.      No facility-administered medications prior to visit.     PAST MEDICAL HISTORY: Past Medical History:  Diagnosis Date  . Aortic stenosis 05/18/2017  . 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 Atrial fibrillation ([place])   . Atrial fibrillation with rapid ventricular response ([place]) 02/14/2017  . Atrial fibrillation with RVR ([place]) 11/15/2017  . Atypical chest pain    a. 2015 Neg MV.  . Cardiomegaly 2005  . Cardiomyopathy ([place])    a. 01/2017 Echo: EF 45%, diff HK, mild LVH, triv AI, mild MR, mod dil LA, mildly dil RA, PASP 70mmHg - in setting of Afib.  [name] DVT (deep venous thrombosis) ([place]) 02/2004  . Fibroid   . Hypercholesteremia   . Hypertension   . Menometrorrhagia 3/92   menopausal  . Microhematuria 1/96  . Mitral regurgitation 05/18/2017  . Nephrolithiasis 2015  . PAF (paroxysmal atrial fibrillation) ([place])    a. 01/2017 TEE/DCCV: EF 45-50%, no LA/LAA thrombus-->successful DCCV @ 150 J. CHA2DS2VASc = 3-->Xarelto.  . Seizure disorder ([place]) 02/17/2017  . Seizures ([place])    followed by [facility]- Dr. Leta Baptist  . SUI (stress urinary incontinence, female) 9/91  . Vulvar cyst 7/90    PAST SURGICAL HISTORY: Past Surgical History:  Procedure Laterality Date  . APPENDECTOMY  1950  . BUNIONECTOMY  7/11   second toe  . CARDIOVERSION N/A 02/17/2017   Procedure: CARDIOVERSION;  Surgeon: Skeet Latch, MD;  Location: [place];   Service: Cardiovascular;  Laterality: N/A;  . cataracts  3/09 4/09  . DILATION AND CURETTAGE OF UTERUS     hystreroscopy/imcomplete polypectomy  . KNEE SURGERY  10/13 or 11/13   right knee arthroscopy  . TEE WITHOUT CARDIOVERSION N/A 02/17/2017   Procedure: TRANSESOPHAGEAL ECHOCARDIOGRAM (TEE);  Surgeon: Skeet Latch, MD;  Location: [place];  Service: Cardiovascular;  Laterality: N/A;  . [place]  . TUBAL LIGATION  1975   BTL    FAMILY HISTORY: Family History  Problem Relation Age of Onset  . Cancer Father        stomach and prostate  . Kidney Stones Father   . Hypertension Mother   . Asthma Mother   .  Kidney Stones Mother   . Stroke Maternal Uncle   . Heart attack Maternal Uncle   . Hypertension Maternal Uncle   . Cancer Paternal Uncle   . Stroke Maternal Grandfather   . Heart disease Sister        Rapid rhythm  . Skin cancer Sister   . Heart disease Brother        Rapid rhythm  . Skin cancer Brother     SOCIAL HISTORY:  Social History   Socioeconomic History  . Marital status: Widowed    Spouse name: Not on file  . Number of children: 2  . Years of education: 76  . Highest education level: Not on file  Occupational History    Comment: volunteer  Social Needs  . Financial resource strain: Not on file  . Food insecurity:    Worry: Not on file    Inability: Not on file  . Transportation needs:    Medical: Not on file    Non-medical: Not on file  Tobacco Use  . Smoking status: Former Smoker    Types: Cigarettes  . Smokeless tobacco: Never Used  . Tobacco comment: quit in 1969  Substance and Sexual Activity  . Alcohol use: Yes    Alcohol/week: 4.2 oz    Types: 7 Glasses of wine per week    Comment: 1 glass of wine with dinner most nights  . Drug use: No  . Sexual activity: Not Currently    Partners: Male  Lifestyle  . Physical activity:    Days per week: Not on file    Minutes per session: Not on file  . Stress:  Not on file  Relationships  . Social connections:    Talks on phone: Not on file    Gets together: Not on file    Attends religious service: Not on file    Active member of club or organization: Not on file    Attends meetings of clubs or organizations: Not on file    Relationship status: Not on file  . Intimate partner violence:    Fear of current or ex partner: Not on file    Emotionally abused: Not on file    Physically abused: Not on file    Forced sexual activity: Not on file  Other Topics Concern  . Not on file  Social History Narrative   Lives alone   Caffeine- coffee- 3 mugs daily     PHYSICAL EXAM  GENERAL EXAM/CONSTITUTIONAL: Vitals:  Vitals:   05/24/18 1342  BP: 122/84  Pulse: (!) 43  Weight: 218 lb 3.2 oz (99 kg)  Height: 5' 1.5" (1.562 m)     Body mass index is 40.56 kg/m. Wt Readings from Last 3 Encounters:  05/24/18 218 lb 3.2 oz (99 kg)  04/19/18 223 lb (101.2 kg)  01/24/18 227 lb (103 kg)     Patient is in no distress; well developed, nourished and groomed; neck is supple  CARDIOVASCULAR:  Examination of carotid arteries is normal; no carotid bruits  IRREGULAR, BRADYCARDIA; no murmurs  Examination of peripheral vascular system by observation and palpation is normal  EYES:  Ophthalmoscopic exam of optic discs and posterior segments is normal; no papilledema or hemorrhages  No exam data present  MUSCULOSKELETAL:  Gait, strength, tone, movements noted in Neurologic exam below  NEUROLOGIC: MENTAL STATUS:  No flowsheet data found.  awake, alert, oriented to person, place and time  recent and remote memory intact  normal attention and concentration  language fluent, comprehension intact, naming intact  fund of knowledge appropriate  CRANIAL NERVE:   2nd - no papilledema on fundoscopic exam  2nd, 3rd, 4th, 6th - pupils equal and reactive to light, visual fields full to confrontation, extraocular muscles intact, no  nystagmus  5th - facial sensation symmetric  7th - facial strength symmetric  8th - hearing intact  9th - palate elevates symmetrically, uvula midline  11th - shoulder shrug symmetric  12th - tongue protrusion midline  MOTOR:   normal bulk and tone, full strength in the BUE, BLE  SENSORY:   normal and symmetric to light touch, temperature, vibration  COORDINATION:   finger-nose-finger, fine finger movements normal  REFLEXES:   deep tendon reflexes TRACE and symmetric  GAIT/STATION:   narrow based gait     DIAGNOSTIC DATA (LABS, IMAGING, TESTING) - I reviewed patient records, labs, notes, testing and imaging myself where available.  Lab Results  Component Value Date   WBC 7.9 04/19/2018   HGB 13.4 04/19/2018   HCT 40.4 04/19/2018   MCV 82 04/19/2018   PLT 194 04/19/2018      Component Value Date/Time   NA 144 04/19/2018 1034   K 4.0 04/19/2018 1034   CL 104 04/19/2018 1034   CO2 27 04/19/2018 1034   GLUCOSE 108 (H) 04/19/2018 1034   GLUCOSE 106 (H) 11/15/2017 1725   BUN 10 04/19/2018 1034   CREATININE 0.88 04/19/2018 1034   CALCIUM 8.8 04/19/2018 1034   PROT 6.0 04/19/2018 1034   ALBUMIN 3.9 04/19/2018 1034   AST 14 04/19/2018 1034   ALT 13 04/19/2018 1034   ALKPHOS 83 04/19/2018 1034   BILITOT 0.4 04/19/2018 1034   GFRNONAA 66 04/19/2018 1034   GFRAA 76 04/19/2018 1034   Lab Results  Component Value Date   CHOL 166 04/19/2018   HDL 35 (L) 04/19/2018   LDLCALC 103 (H) 04/19/2018   TRIG 140 04/19/2018   CHOLHDL 4.7 (H) 04/19/2018   Lab Results  Component Value Date   HGBA1C 5.5 11/15/2017   No results found for: [id number] Lab Results  Component Value Date   TSH 1.940 04/19/2018    08/13/16 MRI brain (with and without) [I reviewed images myself and agree with interpretation. -VRP]  1. Mild periventricular and subcortical foci of chronic small vessel ischemic disease.  2. No acute findings.  08/30/16 EEG  -  normal    ASSESSMENT AND PLAN  72 y.o. year old female here with new onset episode on 08/10/16 of transient vision changes, confusion with operating the television remote control, unprovoked loss of consciousness, tongue biting. Patient had a similar transient vision change episode ~ April 2017, without the additional symptoms.  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  Now on empiric antiseizure medication with levetiracetam 500 mg twice a day, as this episode on 08/10/16 may have been the second event in her life.  Now with new headaches since April 2019.    Ddx: occipital seizure  1. Seizure disorder ([place])   2. Other headache syndrome      PLAN:   NEW ONSET HEADACHES (2-3 months, 2 per week, new problem, addl workup) - check MRI brain w/wo (new onset headache; on anticoagulation) - consider sleep study (patient declines)  SEIZURE PREVENTION (established, stable) - continue levetiracetam 500mg  twice a day  - ok to continue driving; last seizure on 08/10/16.   STROKE PREVENTION (established, stable) - continue stroke risk factor mgmt - advised on treatment  of risk factors, including atrial fibrillation, hyperlipidemia, hypertension; may consider sleep study in future (due to obesity, HTN, snoring)  Orders Placed This Encounter  Procedures  . MR BRAIN W WO CONTRAST   Meds ordered this encounter  Medications  . levETIRAcetam (KEPPRA) 500 MG tablet    Sig: Take 1 tablet (500 mg total) by mouth 2 (two) times daily.    Dispense:  180 tablet    Refill:  4   Return in about 1 year (around 05/25/2019) for with NP.    Penni Bombard, MD 8/87/1959, 7:47 PM Certified in Neurology, Neurophysiology and [facility] [address], [place], [place] [address] [phone]

## 2018-05-26 ENCOUNTER — Telehealth: Payer: Self-pay | Admitting: Diagnostic Neuroimaging

## 2018-05-26 NOTE — Telephone Encounter (Signed)
Patient sent to [facility]. Medicare/BCBS NPR Ref#[id number]

## 2018-06-21 ENCOUNTER — Ambulatory Visit
Admission: RE | Admit: 2018-06-21 | Discharge: 2018-06-21 | Disposition: A | Discharge status: Home / Self Care | Payer: Medicare Other | Source: Ambulatory Visit | Attending: Diagnostic Neuroimaging | Admitting: Diagnostic Neuroimaging

## 2018-06-21 DIAGNOSIS — R51 Headache: Secondary | ICD-10-CM | POA: Diagnosis not present

## 2018-06-21 DIAGNOSIS — G4489 Other headache syndrome: Secondary | ICD-10-CM | POA: Diagnosis not present

## 2018-06-21 MED ORDER — GADOBENATE DIMEGLUMINE 529 MG/ML IV SOLN
20.0000 mL | Freq: Once | INTRAVENOUS | Status: AC | PRN
  Administered 2018-06-21: 20 mL via INTRAVENOUS

## 2018-06-23 ENCOUNTER — Telehealth: Payer: Self-pay | Admitting: *Deleted

## 2018-06-23 NOTE — Telephone Encounter (Signed)
Spoke with patient and informed her that her MRI brain had unremarkable imaging results. Advised there were no acute/major findings. She had no questions, verbalized understanding, appreciation for call.

## 2018-07-18 ENCOUNTER — Encounter: Payer: Self-pay | Admitting: Cardiology

## 2018-08-01 ENCOUNTER — Other Ambulatory Visit: Payer: Self-pay | Admitting: Physician Assistant

## 2018-08-01 ENCOUNTER — Ambulatory Visit: Payer: Medicare Other | Admitting: Cardiology

## 2018-08-01 ENCOUNTER — Ambulatory Visit (INDEPENDENT_AMBULATORY_CARE_PROVIDER_SITE_OTHER): Payer: Medicare Other | Admitting: Cardiology

## 2018-08-01 ENCOUNTER — Encounter: Payer: Self-pay | Admitting: Cardiology

## 2018-08-01 VITALS — BP 112/80 | HR 67 | Ht 61.5 in | Wt 218.6 lb

## 2018-08-01 DIAGNOSIS — I208 Other forms of angina pectoris: Secondary | ICD-10-CM | POA: Diagnosis not present

## 2018-08-01 DIAGNOSIS — I5022 Chronic systolic (congestive) heart failure: Secondary | ICD-10-CM | POA: Diagnosis not present

## 2018-08-01 DIAGNOSIS — I4821 Permanent atrial fibrillation: Secondary | ICD-10-CM

## 2018-08-01 DIAGNOSIS — G40909 Epilepsy, unspecified, not intractable, without status epilepticus: Secondary | ICD-10-CM

## 2018-08-01 NOTE — Patient Instructions (Signed)
Medication Instructions:  The current medical regimen is effective;  continue present plan and medications.  If you need a refill on your cardiac medications before your next appointment, please call your pharmacy.   Follow-Up: At [facility], you and your health needs are our priority.  As part of our continuing mission to provide you with exceptional heart care, we have created designated Provider Care Teams.  These Care Teams include your primary Cardiologist (physician) and Advanced Practice Providers (APPs -  Physician Assistants and Nurse Practitioners) who all work together to provide you with the care you need, when you need it. You will need a follow up appointment in 6 months with Ashley Merle, NP and with Ashley Pacheco in 1 year.  Please call our office 2 months in advance to schedule this appointment.    Thank you for choosing [facility]!!

## 2018-08-01 NOTE — Progress Notes (Signed)
Cardiology Office Note:    Date:  08/01/2018   ID:  Ashley Pacheco, DOB April 03, 1946, MRN [id number]  PCP:  Leighton Ruff, MD  Cardiologist:  Candee Furbish, MD  Electrophysiologist:  None   Referring MD: Leighton Ruff, MD     History of Present Illness:    Ashley Pacheco is a 72 y.o. female with paroxysmal atrial fibrillation follow-up.  Seen last by Truitt Merle 04/22/2018- in review of her note, was found to be in atrial fibrillation asymptomatic rate controlled with EF 40 to 45% and she was admitted subsequently with rapid ventricular response and EP was consulted and once again recommended rate control strategy with pacemaker consideration AV nodal ablation only if absolutely necessary.  Repeat cardioversion was not recommended because she was symptom-free.  She was changed from IV Cardizem to Lopressor and loaded with IV digoxin at that time.  Very difficult to control and her Lopressor was increased to 50 twice a day.  Nuclear stress test was performed on 11/19/2017 and showed no ischemia EF was 40 to 45% on echocardiogram, nuclear 34.  Previously she was quite fatigued short of breath unchanged.  2 naps a day.  Very sedentary.  Knee pain.  Past Medical History:  Diagnosis Date  . Aortic stenosis 05/18/2017  . 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 Atrial fibrillation ([place])   . Atrial fibrillation with rapid ventricular response ([place]) 02/14/2017  . Atrial fibrillation with RVR ([place]) 11/15/2017  . Atypical chest pain    a. 2015 Neg MV.  . Cardiomegaly 2005  . Cardiomyopathy ([place])    a. 01/2017 Echo: EF 45%, diff HK, mild LVH, triv AI, mild MR, mod dil LA, mildly dil RA, PASP 77mmHg - in setting of Afib.  [name] DVT (deep venous thrombosis) ([place]) 02/2004  . Fibroid   . Hypercholesteremia   . Hypertension   . Menometrorrhagia 3/92   menopausal  . Microhematuria 1/96  . Mitral regurgitation 05/18/2017  . Nephrolithiasis 2015  . PAF (paroxysmal atrial fibrillation) ([place])    a. 01/2017  TEE/DCCV: EF 45-50%, no LA/LAA thrombus-->successful DCCV @ 150 J. CHA2DS2VASc = 3-->Xarelto.  . Seizure disorder ([place]) 02/17/2017  . Seizures ([place])    followed by [facility]- Dr. Leta Baptist  . SUI (stress urinary incontinence, female) 9/91  . Vulvar cyst 7/90    Past Surgical History:  Procedure Laterality Date  . APPENDECTOMY  1950  . BUNIONECTOMY  7/11   second toe  . CARDIOVERSION N/A 02/17/2017   Procedure: CARDIOVERSION;  Surgeon: Skeet Latch, MD;  Location: [facility];  Service: Cardiovascular;  Laterality: N/A;  . cataracts  3/09 4/09  . DILATION AND CURETTAGE OF UTERUS     hystreroscopy/imcomplete polypectomy  . KNEE SURGERY  10/13 or 11/13   right knee arthroscopy  . TEE WITHOUT CARDIOVERSION N/A 02/17/2017   Procedure: TRANSESOPHAGEAL ECHOCARDIOGRAM (TEE);  Surgeon: Skeet Latch, MD;  Location: [facility];  Service: Cardiovascular;  Laterality: N/A;  . [place]  . TUBAL LIGATION  1975   BTL    Current Medications: Current Meds  Medication Sig  . Cholecalciferol (VITAMIN D) 2000 UNITS tablet Take 2,000 Units by mouth 2 (two) times daily.   . digoxin (LANOXIN) 0.125 MG tablet Take 1 tablet (0.125 mg total) by mouth daily.  . diphenhydrAMINE (BENADRYL ALLERGY) 25 MG tablet Take 25 mg by mouth as needed (allergies).   . ferrous sulfate 325 (65 FE) MG tablet Take 325 mg by mouth daily with  breakfast.  . furosemide (LASIX) 40 MG tablet TAKE 1 TABLET BY MOUTH EVERY DAY  . levETIRAcetam (KEPPRA) 500 MG tablet Take 1 tablet (500 mg total) by mouth 2 (two) times daily.  [name] loperamide (IMODIUM) 2 MG capsule Take 2 mg by mouth as needed for diarrhea or loose stools.  . lovastatin (MEVACOR) 40 MG tablet Take 40 mg by mouth every morning.   . metoprolol succinate (TOPROL-XL) 50 MG 24 hr tablet Take 50 mg by mouth 2 (two) times daily. Take with or immediately following a meal.  . Multiple Vitamin (MULTI-VITAMIN DAILY PO) Take 1 tablet  by mouth daily.   . rivaroxaban (XARELTO) 20 MG TABS tablet TAKE 1 TABLET BY MOUTH DAILY WITH SUPPER.  . sertraline (ZOLOFT) 100 MG tablet Take 100 mg by mouth daily.  . traZODone (DESYREL) 150 MG tablet Take 150 mg by mouth at bedtime.      Allergies:   Adhesive [tape]   Social History   Socioeconomic History  . Marital status: Widowed    Spouse name: Not on file  . Number of children: 2  . Years of education: 51  . Highest education level: Not on file  Occupational History    Comment: volunteer  Social Needs  . Financial resource strain: Not on file  . Food insecurity:    Worry: Not on file    Inability: Not on file  . Transportation needs:    Medical: Not on file    Non-medical: Not on file  Tobacco Use  . Smoking status: Former Smoker    Types: Cigarettes  . Smokeless tobacco: Never Used  . Tobacco comment: quit in 1969  Substance and Sexual Activity  . Alcohol use: Yes    Alcohol/week: 7.0 standard drinks    Types: 7 Glasses of wine per week    Comment: 1 glass of wine with dinner most nights  . Drug use: No  . Sexual activity: Not Currently    Partners: Male  Lifestyle  . Physical activity:    Days per week: Not on file    Minutes per session: Not on file  . Stress: Not on file  Relationships  . Social connections:    Talks on phone: Not on file    Gets together: Not on file    Attends religious service: Not on file    Active member of club or organization: Not on file    Attends meetings of clubs or organizations: Not on file    Relationship status: Not on file  Other Topics Concern  . Not on file  Social History Narrative   Lives alone   Caffeine- coffee- 3 mugs daily     Family History: The patient's family history includes Asthma in her mother; Cancer in her father and paternal uncle; Heart attack in her maternal uncle; Heart disease in her brother and sister; Hypertension in her maternal uncle and mother; Kidney Stones in her father and mother;  Skin cancer in her brother and sister; Stroke in her maternal grandfather and maternal uncle.  ROS:   Please see the history of present illness.    No fevers chills nausea vomiting syncope bleeding all other systems reviewed and are negative.  EKGs/Labs/Other Studies Reviewed:    The following studies were reviewed today:  05/17/2018 echocardiogram: - Left ventricle: The cavity size was normal. There was moderate   concentric hypertrophy. Systolic function was mildly to   moderately reduced. The estimated ejection fraction was in the  range of 40% to 45%. Global hypokinesis with more severe apical   hypokinesis. The study is not technically sufficient to allow   evaluation of LV diastolic function. - Aortic valve: Mildly calcified leaflets. Mild stenosis. Trivial   regurgitation. Mean gradient (S): 6 mm Hg. - Mitral valve: Mildly thickened leaflets . There was trivial   regurgitation. - Left atrium: Severely dilated. - Right ventricle: The cavity size was normal. Wall thickness was   normal. Systolic function appears mildly decreased. - Right atrium: The atrium was mildly dilated. - Tricuspid valve: There was mild regurgitation. - Pulmonary arteries: PA peak pressure: 28 mm Hg (S). - Inferior vena cava: The vessel was normal in size. The   respirophasic diameter changes were in the normal range (>= 50%),   consistent with normal central venous pressure.  Impressions:  - Compared to a prior study in 04/2017, there are no significant   changes. The mean aortic valve gradient is likely undersampled in   this study.  EKG:  EKG is not ordered today.  Prior 11/15/2017- heart rate 130 A. fib RVR  Recent Labs: 11/15/2017: B Natriuretic Peptide 356.4; Magnesium 2.0 04/19/2018: ALT 13; BUN 10; Creatinine, Ser 0.88; Hemoglobin 13.4; Platelets 194; Potassium 4.0; Sodium 144; TSH 1.940  Recent Lipid Panel    Component Value Date/Time   CHOL 166 04/19/2018 1034   TRIG 140 04/19/2018  1034   HDL 35 (L) 04/19/2018 1034   CHOLHDL 4.7 (H) 04/19/2018 1034   LDLCALC 103 (H) 04/19/2018 1034    Physical Exam:    VS:  BP 112/80   Pulse 67   Ht 5' 1.5" (1.562 m)   Wt 218 lb 9.6 oz (99.2 kg)   LMP 10/26/1999   SpO2 98%   BMI 40.64 kg/m     Wt Readings from Last 3 Encounters:  08/01/18 218 lb 9.6 oz (99.2 kg)  05/24/18 218 lb 3.2 oz (99 kg)  04/19/18 223 lb (101.2 kg)     GEN:  Well nourished, well developed in no acute distress HEENT: Normal overweight, utilizing cane NECK: No JVD; No carotid bruits LYMPHATICS: No lymphadenopathy CARDIAC: irreg irreg normal rate, no murmurs, rubs, gallops RESPIRATORY:  Clear to auscultation without rales, wheezing or rhonchi  ABDOMEN: Soft, non-tender, non-distended MUSCULOSKELETAL:  No edema (chronic subcutaneous tissue noted); No deformity  SKIN: Warm and dry NEUROLOGIC:  Alert and oriented x 3 PSYCHIATRIC:  Normal affect   ASSESSMENT:    1. Permanent atrial fibrillation   2. Chronic systolic heart failure ([place])   3. Morbid obesity ([place])   4. Seizure disorder ([place])    PLAN:    In order of problems listed above:  Permanent atrial fibrillation -Rate control has been challenging but currently on regimen she seems to be doing quite well.  Medications reviewed as above.  Includes digoxin.  No changes made today.  Chronic anticoagulation -Xarelto 20 mg.  Last creatinine 0.7, hemoglobin 13.4.  No signs of bleeding.  No strokelike symptoms.  Seizure disorder - Dr. Leta Baptist  Morbid obesity -Continue to encourage weight loss, decrease carbohydrates.  Challenging to do with knee pain.  Perhaps water aerobics.  She states that she has lost about 12 pounds since last year.  Excellent.  Chronic systolic heart failure -Ejection fraction moderately reduced to 45%.  Stable.  Nuclear stress test with no ischemia.  Continue with Toprol.  Unable to utilize ACE inhibitor because of hypotension previously.   Medication  Adjustments/Labs and Tests Ordered: Current medicines are reviewed  at length with the patient today.  Concerns regarding medicines are outlined above.  No orders of the defined types were placed in this encounter.  No orders of the defined types were placed in this encounter.   Patient Instructions  Medication Instructions:  The current medical regimen is effective;  continue present plan and medications.  If you need a refill on your cardiac medications before your next appointment, please call your pharmacy.   Follow-Up: At [facility], you and your health needs are our priority.  As part of our continuing mission to provide you with exceptional heart care, we have created designated Provider Care Teams.  These Care Teams include your primary Cardiologist (physician) and Advanced Practice Providers (APPs -  Physician Assistants and Nurse Practitioners) who all work together to provide you with the care you need, when you need it. You will need a follow up appointment in 6 months with Truitt Merle, NP and with Dr Marlou Porch in 1 year.  Please call our office 2 months in advance to schedule this appointment.    Thank you for choosing [facility]!!         Signed, Candee Furbish, MD  08/01/2018 9:27 AM    [place]

## 2018-08-09 DIAGNOSIS — Z23 Encounter for immunization: Secondary | ICD-10-CM | POA: Diagnosis not present

## 2018-12-02 ENCOUNTER — Other Ambulatory Visit: Payer: Self-pay | Admitting: Physician Assistant

## 2019-01-16 ENCOUNTER — Other Ambulatory Visit: Payer: Self-pay | Admitting: Cardiology

## 2019-02-01 ENCOUNTER — Telehealth: Payer: Self-pay | Admitting: *Deleted

## 2019-02-01 NOTE — Telephone Encounter (Signed)
Virtual Visit Pre-Appointment Phone Call  Steps For Call:  1. Confirm consent - "In the setting of the current Covid19 crisis, you are scheduled for a (phone or video) visit with your provider on (date) at (time).  Just as we do with many in-office visits, in order for you to participate in this visit, we must obtain consent.  If you'd like, I can send this to your mychart (if signed up) or email for you to review.  Otherwise, I can obtain your verbal consent now.  All virtual visits are billed to your insurance company just like a normal visit would be.  By agreeing to a virtual visit, we'd like you to understand that the technology does not allow for your provider to perform an examination, and thus may limit your provider's ability to fully assess your condition.  Finally, though the technology is pretty good, we cannot assure that it will always work on either your or our end, and in the setting of a video visit, we may have to convert it to a phone-only visit.  In either situation, we cannot ensure that we have a secure connection.  Are you willing to proceed?"  2. Give patient instructions for WebEx download to smartphone as below if video visit  3. Advise patient to be prepared with any vital sign or heart rhythm information, their current medicines, and a piece of paper and pen handy for any instructions they may receive the day of their visit  4. Inform patient they will receive a phone call 15 minutes prior to their appointment time (may be from unknown caller ID) so they should be prepared to answer  5. Confirm that appointment type is correct in Epic appointment notes (video vs telephone)    TELEPHONE CALL NOTE  Ashley Pacheco has been deemed a candidate for a follow-up tele-health visit to limit community exposure during the Covid-19 pandemic. I spoke with the patient via phone to ensure availability of phone/video source, confirm preferred email & phone number, and discuss  instructions and expectations.  I reminded Ashley Pacheco to be prepared with any vital sign and/or heart rhythm information that could potentially be obtained via home monitoring, at the time of her visit. I reminded Ashley Pacheco to expect a phone call at the time of her visit if her visit. 4/13 @ 9:00 am.  Did the patient verbally acknowledge consent to treatment? Yes  [name] 02/01/2019 9:05 AM   DOWNLOADING THE [place]  - If Apple, go to [employer] and type in WebEx in the search bar. [place] [employer], the blue/green circle. The app is free but as with any other app downloads, their phone may require them to verify saved payment information or Apple password. The patient does NOT have to create an account.  - If Android, ask patient to go to [employer] and type in WebEx in the search bar. [place] [employer], the blue/green circle. The app is free but as with any other app downloads, their phone may require them to verify saved payment information or Android password. The patient does NOT have to create an account.   CONSENT FOR TELE-HEALTH VISIT - PLEASE REVIEW  I hereby voluntarily request, consent and authorize [facility] and its employed or contracted physicians, Engineer, materials, nurse practitioners or other licensed health care professionals Truitt Merle, NP), to provide me with telemedicine health care services (landline phone) as deemed necessary  by the treating Practitioner. I acknowledge and consent to receive the Services by the Practitioner via telemedicine. I understand that the telemedicine visit will involve communicating with the Practitioner through live audiovisual communication technology and the disclosure of certain medical information by electronic transmission. I acknowledge that I have been given the opportunity to request an in-person assessment or other available alternative prior to the  telemedicine visit and am voluntarily participating in the telemedicine visit.  I understand that I have the right to withhold or withdraw my consent to the use of telemedicine in the course of my care at any time, without affecting my right to future care or treatment, and that the Practitioner or I may terminate the telemedicine visit at any time. I understand that I have the right to inspect all information obtained and/or recorded in the course of the telemedicine visit and may receive copies of available information for a reasonable fee.  I understand that some of the potential risks of receiving the Services via telemedicine include:  [name] Delay or interruption in medical evaluation due to technological equipment failure or disruption; . Information transmitted may not be sufficient (e.g. poor resolution of images) to allow for appropriate medical decision making by the Practitioner; and/or  . In rare instances, security protocols could fail, causing a breach of personal health information.  Furthermore, I acknowledge that it is my responsibility to provide information about my medical history, conditions and care that is complete and accurate to the best of my ability. I acknowledge that Practitioner's advice, recommendations, and/or decision may be based on factors not within their control, such as incomplete or inaccurate data provided by me or distortions of diagnostic images or specimens that may result from electronic transmissions. I understand that the practice of medicine is not an exact science and that Practitioner makes no warranties or guarantees regarding treatment outcomes. I acknowledge that I will receive a copy of this consent concurrently upon execution via email to the email address I last provided but may also request a printed copy by calling the office of [facility].    I understand that my insurance will be billed for this visit.   I have read or had this consent read to me.  . I understand the contents of this consent, which adequately explains the benefits and risks of the Services being provided via telemedicine.  . I have been provided ample opportunity to ask questions regarding this consent and the Services and have had my questions answered to my satisfaction. . I give my informed consent for the services to be provided through the use of telemedicine in my medical care  By participating in this telemedicine visit I agree to the above.

## 2019-02-01 NOTE — Telephone Encounter (Signed)
Will try later for consent on upcoming appt on 4/13 @ 9:00.

## 2019-02-04 NOTE — Progress Notes (Signed)
Telehealth Visit     Virtual Visit via Telephone Note   This visit type was conducted due to national recommendations for restrictions regarding the COVID-19 Pandemic (e.g. social distancing) in an effort to limit this patient's exposure and mitigate transmission in our community.  Due to her co-morbid illnesses, this patient is at least at moderate risk for complications without adequate follow up.  This format is felt to be most appropriate for this patient at this time.  The patient did not have access to video technology/had technical difficulties with video requiring transitioning to audio format only (telephone).  All issues noted in this document were discussed and addressed.  No physical exam could be performed with this format.  Please refer to the patient's chart for her  consent to telehealth for [facility].   Evaluation Performed:  Follow-up visit  This visit type was conducted due to national recommendations for restrictions regarding the COVID-19 Pandemic (e.g. social distancing).  This format is felt to be most appropriate for this patient at this time.  All issues noted in this document were discussed and addressed.  No physical exam was performed (except for noted visual exam findings with Video Visits).  Please refer to the patient's chart (MyChart message for video visits and phone note for telephone visits) for the patient's consent to telehealth for [facility].  Date:  02/05/2019   ID:  Ashley Pacheco, DOB 04-07-1946, MRN [id number]  Patient Location:  Home  Provider location:   Home  PCP:  Leighton Ruff, MD  Cardiologist:  Servando Snare & Candee Furbish, MD  Electrophysiologist:  None   Chief Complaint:  Follow up visit.   History of Present Illness:    Ashley Pacheco is a 73 y.o. female who presents via audio/video conferencing for a telehealth visit today.  Seen for Dr. Marlou Porch.   She has a history of PAF with prior TEE cardioversion in 01/2017. EF 40% at that  time. EF in 2015 had been normal. Other issues include morbid obesity, seizure disorder, hypertension, hyperlipidemia. In 2019 she was found to be in AF - was asymptomatic. Opted for rate control and continued anticoagulation. EF mildly reduced at 40 to 45%. Then was admitted with RVR - has seen EP who recommended continued rate control with consideration of AV nodal ablation and PPM only if necessary and symptomatic. Her rate had been difficult to control. She had a myoview once rate control was obtained - no ischemia and EF 34% but echo with EF 40 to 45%.  She is managed as permanent AF with rate control and anticoagulation.   Last seen in October by me. Pretty sedentary. Typically takes 2 naps a day along with sleeping 8 to 9 hours per night. Limited by knee pain. No interest in trying to increase her activity level.  .  The patient does not have symptoms concerning for COVID-19 infection (fever, chills, cough, or new shortness of breath).   Seen today via telephone call - patient only has land line phone. She has no means of checking her BP for [place]. She needs med refills today. She does live by herself. She has been going out but really trying to minimize. She has her "normal cough" and her "same shortness of breath". She is able to walk inside her house with her cane. She does lose her balance easily. She has had 2 falls - both in November - she is now using her cane when she goes outside. One of her falls  was where she missed a step while going up her porch  The other was while working in her bushes. No serious injury reported. She has had to have the neighbors help her up.  No bleeding. Some dizziness in the morning that clears with sitting for a few seconds. No passing out. No palpitations. Overall, she feels like she is at her baseline.   Past Medical History:  Diagnosis Date  . Aortic stenosis 05/18/2017  . 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 Atrial fibrillation ([place])   . Atrial fibrillation with rapid  ventricular response ([place]) 02/14/2017  . Atrial fibrillation with RVR ([place]) 11/15/2017  . Atypical chest pain    a. 2015 Neg MV.  . Cardiomegaly 2005  . Cardiomyopathy ([place])    a. 01/2017 Echo: EF 45%, diff HK, mild LVH, triv AI, mild MR, mod dil LA, mildly dil RA, PASP 15mmHg - in setting of Afib.  [name] DVT (deep venous thrombosis) ([place]) 02/2004  . Fibroid   . Hypercholesteremia   . Hypertension   . Menometrorrhagia 3/92   menopausal  . Microhematuria 1/96  . Mitral regurgitation 05/18/2017  . Nephrolithiasis 2015  . PAF (paroxysmal atrial fibrillation) ([place])    a. 01/2017 TEE/DCCV: EF 45-50%, no LA/LAA thrombus-->successful DCCV @ 150 J. CHA2DS2VASc = 3-->Xarelto.  . Seizure disorder ([place]) 02/17/2017  . Seizures ([place])    followed by [facility]- Dr. Leta Baptist  . SUI (stress urinary incontinence, female) 9/91  . Vulvar cyst 7/90   Past Surgical History:  Procedure Laterality Date  . APPENDECTOMY  1950  . BUNIONECTOMY  7/11   second toe  . CARDIOVERSION N/A 02/17/2017   Procedure: CARDIOVERSION;  Surgeon: Skeet Latch, MD;  Location: [place];  Service: Cardiovascular;  Laterality: N/A;  . cataracts  3/09 4/09  . DILATION AND CURETTAGE OF UTERUS     hystreroscopy/imcomplete polypectomy  . KNEE SURGERY  10/13 or 11/13   right knee arthroscopy  . TEE WITHOUT CARDIOVERSION N/A 02/17/2017   Procedure: TRANSESOPHAGEAL ECHOCARDIOGRAM (TEE);  Surgeon: Skeet Latch, MD;  Location: [place];  Service: Cardiovascular;  Laterality: N/A;  . [place]  . TUBAL LIGATION  1975   BTL     Current Meds  Medication Sig  . Cholecalciferol (VITAMIN D) 2000 UNITS tablet Take 2,000 Units by mouth daily.   . digoxin (LANOXIN) 0.125 MG tablet TAKE 1 TABLET (0.125 MG TOTAL) BY MOUTH DAILY.  . diphenhydrAMINE (BENADRYL ALLERGY) 25 MG tablet Take 25 mg by mouth as needed (allergies).   . furosemide (LASIX) 40 MG tablet TAKE 1 TABLET BY MOUTH EVERY DAY  .  levETIRAcetam (KEPPRA) 500 MG tablet Take 1 tablet (500 mg total) by mouth 2 (two) times daily.  [name] loperamide (IMODIUM) 2 MG capsule Take 2 mg by mouth as needed for diarrhea or loose stools.  . lovastatin (MEVACOR) 40 MG tablet Take 40 mg by mouth every morning.   . metoprolol succinate (TOPROL-XL) 50 MG 24 hr tablet Take 50 mg by mouth 2 (two) times daily. Take with or immediately following a meal.  . Multiple Vitamin (MULTI-VITAMIN DAILY PO) Take 1 tablet by mouth daily.   . naproxen sodium (ALEVE) 220 MG tablet Take 220 mg by mouth daily as needed (rarely as needed for knee pain).  . rivaroxaban (XARELTO) 20 MG TABS tablet TAKE 1 TABLET BY MOUTH DAILY WITH SUPPER.  . sertraline (ZOLOFT) 100 MG tablet Take 100 mg by mouth daily.  . traZODone (DESYREL) 150  MG tablet Take 150 mg by mouth at bedtime.      Allergies:   Adhesive [tape]   Social History   Tobacco Use  . Smoking status: Former Smoker    Types: Cigarettes  . Smokeless tobacco: Never Used  . Tobacco comment: quit in 1969  Substance Use Topics  . Alcohol use: Yes    Alcohol/week: 7.0 standard drinks    Types: 7 Glasses of wine per week    Comment: 1 glass of wine with dinner most nights  . Drug use: No     Family Hx: The patient's family history includes Asthma in her mother; Cancer in her father and paternal uncle; Heart attack in her maternal uncle; Heart disease in her brother and sister; Hypertension in her maternal uncle and mother; Kidney Stones in her father and mother; Skin cancer in her brother and sister; Stroke in her maternal grandfather and maternal uncle.  ROS:   Please see the history of present illness.   All other systems reviewed are negative except for knee pain.    Objective:    Vital Signs:  Temp 98.2 F (36.8 C)   Ht 5\' 2"  (1.575 m)   Wt 220 lb (99.8 kg)   LMP 10/26/1999   BMI 40.24 kg/m    Wt Readings from Last 3 Encounters:  02/05/19 220 lb (99.8 kg)  08/01/18 218 lb 9.6 oz (99.2 kg)   05/24/18 218 lb 3.2 oz (99 kg)    Alert. She does not sound to be in acute distress. Not short of breath with conversation. Appropriate in responses.    Labs/Other Tests and Data Reviewed:    Lab Results  Component Value Date   WBC 7.9 04/19/2018   HGB 13.4 04/19/2018   HCT 40.4 04/19/2018   PLT 194 04/19/2018   GLUCOSE 108 (H) 04/19/2018   CHOL 166 04/19/2018   TRIG 140 04/19/2018   HDL 35 (L) 04/19/2018   LDLCALC 103 (H) 04/19/2018   ALT 13 04/19/2018   AST 14 04/19/2018   NA 144 04/19/2018   K 4.0 04/19/2018   CL 104 04/19/2018   CREATININE 0.88 04/19/2018   BUN 10 04/19/2018   CO2 27 04/19/2018   TSH 1.940 04/19/2018   INR 3.13 11/15/2017   HGBA1C 5.5 11/15/2017       BNP (last 3 results) No results for input(s): BNP in the last 8760 hours.  ProBNP (last 3 results) No results for input(s): PROBNP in the last 8760 hours.    Prior CV studies:    The following studies were reviewed today:  Echo Study Conclusions 04/2018  - Left ventricle: The cavity size was normal. There was moderate   concentric hypertrophy. Systolic function was mildly to   moderately reduced. The estimated ejection fraction was in the   range of 40% to 45%. Global hypokinesis with more severe apical   hypokinesis. The study is not technically sufficient to allow   evaluation of LV diastolic function. - Aortic valve: Mildly calcified leaflets. Mild stenosis. Trivial   regurgitation. Mean gradient (S): 6 mm Hg. - Mitral valve: Mildly thickened leaflets . There was trivial   regurgitation. - Left atrium: Severely dilated. - Right ventricle: The cavity size was normal. Wall thickness was   normal. Systolic function appears mildly decreased. - Right atrium: The atrium was mildly dilated. - Tricuspid valve: There was mild regurgitation. - Pulmonary arteries: PA peak pressure: 28 mm Hg (S). - Inferior vena cava: The vessel was normal in  size. The   respirophasic diameter changes were in  the normal range (>= 50%),   consistent with normal central venous pressure.  Impressions:  - Compared to a prior study in 04/2017, there are no significant   changes. The mean aortic valve gradient is likely undersampled in   this study.   ASSESSMENT & PLAN:    1. Permanent atrial fibrillation - she is managed with rate control and anticoagulation - HR is reportedly ok today.   2. Chronic anticoagulation - no bleeding or excessive bruising reported - she had some falls back in November - now using her cane and trying to be more cautious.   3. Chronic systolic HF - breathing is at her baseline.   4. Morbid obesity - she is limited by knee pain and has not wished to do any activity that would make her knee pain worse - she has been trying to walk in her house but is avoiding walking outside due to prior falls.    5. Seizure disorder - not discussed today.   6. High risk medicine - no obvious issues noted thru phone conversation. Her medicines are refilled today.   7. COVID-19 Education: The signs and symptoms of COVID-19 were discussed with the patient and how to seek care for testing (follow up with PCP or arrange E-visit).  The importance of social distancing, staying at home and hand hygiene were discussed today.  Patient Risk:   After full review of this patient's clinical status, I feel that they are at least moderate risk at this time.  Time:   Today, I have spent 15 minutes with the patient with telehealth technology discussing the above issues.     Medication Adjustments/Labs and Tests Ordered: Current medicines are reviewed at length with the patient today.  Concerns regarding medicines are outlined above. Xarelto and Digoxin were refilled today.   Tests Ordered: No orders of the defined types were placed in this encounter.   Medication Changes: No orders of the defined types were placed in this encounter.   Disposition:  FU with Dr. Marlou Porch in October per  recall.    Patient is agreeable to this plan and will call if any problems develop in the interim.   Amie Critchley, NP  02/05/2019 9:01 AM    [place]

## 2019-02-05 ENCOUNTER — Telehealth (INDEPENDENT_AMBULATORY_CARE_PROVIDER_SITE_OTHER): Payer: Medicare Other | Admitting: Nurse Practitioner

## 2019-02-05 ENCOUNTER — Other Ambulatory Visit: Payer: Self-pay

## 2019-02-05 ENCOUNTER — Encounter: Payer: Self-pay | Admitting: Nurse Practitioner

## 2019-02-05 VITALS — Temp 98.2°F | Ht 62.0 in | Wt 220.0 lb

## 2019-02-05 DIAGNOSIS — Z7189 Other specified counseling: Secondary | ICD-10-CM

## 2019-02-05 DIAGNOSIS — Z7901 Long term (current) use of anticoagulants: Secondary | ICD-10-CM

## 2019-02-05 DIAGNOSIS — I5022 Chronic systolic (congestive) heart failure: Secondary | ICD-10-CM

## 2019-02-05 DIAGNOSIS — I4821 Permanent atrial fibrillation: Secondary | ICD-10-CM | POA: Diagnosis not present

## 2019-02-05 MED ORDER — DIGOXIN 125 MCG PO TABS: 0.1250 mg | ORAL_TABLET | Freq: Every day | ORAL | 3 refills | Status: DC

## 2019-02-05 MED ORDER — RIVAROXABAN 20 MG PO TABS: ORAL_TABLET | ORAL | 3 refills | Status: DC

## 2019-02-05 NOTE — Patient Instructions (Addendum)
After Visit Summary:  We will be checking the following labs today - NONE   Medication Instructions:    Continue with your current medicines.   I will be sending in your refills for your Xarelto and your Digoxin today.    If you need a refill on your cardiac medications before your next appointment, please call your pharmacy.     Testing/Procedures To Be Arranged:  N/A  Follow-Up:   See Dr. Marlou Porch in October per recall    At [facility], you and your health needs are our priority.  As part of our continuing mission to provide you with exceptional heart care, we have created designated Provider Care Teams.  These Care Teams include your primary Cardiologist (physician) and Advanced Practice Providers (APPs -  Physician Assistants and Nurse Practitioners) who all work together to provide you with the care you need, when you need it.  Special Instructions:  . Stay safe, stay home and wash your hands for at least 20 seconds! . It was good to talk with you today. I think you are doing ok and at your baseline.  . Call us for any bleeding or excessive bruising.  . Call us for any changes from your baseline.  . Walk inside as much as you can.   Call the [place] office at [phone] if you have any questions, problems or concerns.

## 2019-02-06 ENCOUNTER — Ambulatory Visit: Payer: Medicare Other | Admitting: Nurse Practitioner

## 2019-04-03 ENCOUNTER — Other Ambulatory Visit: Payer: Self-pay

## 2019-04-03 ENCOUNTER — Ambulatory Visit (INDEPENDENT_AMBULATORY_CARE_PROVIDER_SITE_OTHER): Payer: Medicare Other | Admitting: Obstetrics & Gynecology

## 2019-04-03 ENCOUNTER — Encounter: Payer: Self-pay | Admitting: Obstetrics & Gynecology

## 2019-04-03 VITALS — BP 118/78 | HR 76 | Temp 96.4°F | Ht 61.5 in | Wt 217.0 lb

## 2019-04-03 DIAGNOSIS — Z124 Encounter for screening for malignant neoplasm of cervix: Secondary | ICD-10-CM | POA: Diagnosis not present

## 2019-04-03 DIAGNOSIS — Z01419 Encounter for gynecological examination (general) (routine) without abnormal findings: Secondary | ICD-10-CM

## 2019-04-03 NOTE — Progress Notes (Signed)
73 y.o. G2P2 Widowed White or Caucasian female here for annual exam.  Reports she fell at home twice in the winter.  Both of these were when she was outside.  Reports she doesn't have lower leg strength to get up.  Denies vaginal bleeding.    PCP:  Dr. Drema Dallas.  Does have July appt.  Will do blood work then.    Patient's last menstrual period was 10/26/1999.          Sexually active: No.  The current method of family planning is tubal ligation.    Exercising: No.   Smoker:  no  Health Maintenance: Pap:  01/24/18 Neg   06/27/15 neg History of abnormal Pap:  no MMG:  07/20/17 BIRADS1:Neg  Colonoscopy:  2013 f/u 5 years.  She knows this is due.  She is aware.   BMD:   07/20/17 Osteopenia  TDaP:  Current  Pneumonia vaccine(s):  PCP Shingrix: completed  Hep C testing: PCP Screening Labs: PCP   reports that she has quit smoking. Her smoking use included cigarettes. She has never used smokeless tobacco. She reports current alcohol use of about 7.0 standard drinks of alcohol per week. She reports that she does not use drugs.  Past Medical History:  Diagnosis Date  . Aortic stenosis 05/18/2017  . 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 Atrial fibrillation ([place])   . Atrial fibrillation with rapid ventricular response ([place]) 02/14/2017  . Atrial fibrillation with RVR ([place]) 11/15/2017  . Atypical chest pain    a. 2015 Neg MV.  . Cardiomegaly 2005  . Cardiomyopathy ([place])    a. 01/2017 Echo: EF 45%, diff HK, mild LVH, triv AI, mild MR, mod dil LA, mildly dil RA, PASP 42mmHg - in setting of Afib.  [name] DVT (deep venous thrombosis) ([place]) 02/2004  . Fibroid   . Hypercholesteremia   . Hypertension   . Menometrorrhagia 3/92   menopausal  . Microhematuria 1/96  . Mitral regurgitation 05/18/2017  . Nephrolithiasis 2015  . PAF (paroxysmal atrial fibrillation) ([place])    a. 01/2017 TEE/DCCV: EF 45-50%, no LA/LAA thrombus-->successful DCCV @ 150 J. CHA2DS2VASc = 3-->Xarelto.  . Seizure disorder ([place]) 02/17/2017  . Seizures  ([place])    followed by [facility]- Dr. Leta Baptist  . SUI (stress urinary incontinence, female) 9/91  . Vulvar cyst 7/90    Past Surgical History:  Procedure Laterality Date  . APPENDECTOMY  1950  . BUNIONECTOMY  7/11   second toe  . CARDIOVERSION N/A 02/17/2017   Procedure: CARDIOVERSION;  Surgeon: Skeet Latch, MD;  Location: [employer];  Service: Cardiovascular;  Laterality: N/A;  . cataracts  3/09 4/09  . DILATION AND CURETTAGE OF UTERUS     hystreroscopy/imcomplete polypectomy  . KNEE SURGERY  10/13 or 11/13   right knee arthroscopy  . TEE WITHOUT CARDIOVERSION N/A 02/17/2017   Procedure: TRANSESOPHAGEAL ECHOCARDIOGRAM (TEE);  Surgeon: Skeet Latch, MD;  Location: [place];  Service: Cardiovascular;  Laterality: N/A;  . [place]  . TUBAL LIGATION  1975   BTL    Current Outpatient Medications  Medication Sig Dispense Refill  . aspirin EC 81 MG tablet Take 81 mg by mouth daily.    . Cholecalciferol (VITAMIN D) 2000 UNITS tablet Take 2,000 Units by mouth daily.     [name] Cod Liver Oil 1000 MG CAPS Take by mouth.    . digoxin (LANOXIN) 0.125 MG tablet Take 1 tablet (0.125 mg total) by mouth daily. 90 tablet 3  .  diphenhydrAMINE (BENADRYL ALLERGY) 25 MG tablet Take 25 mg by mouth as needed (allergies).     . Ferrous Gluconate (IRON 27 PO) Take by mouth daily.    . furosemide (LASIX) 40 MG tablet TAKE 1 TABLET BY MOUTH EVERY DAY 90 tablet 1  . HYDROcodone-acetaminophen (NORCO/VICODIN) 5-325 MG tablet Take 1 tablet by mouth every 6 (six) hours as needed for moderate pain.    [name] levETIRAcetam (KEPPRA) 500 MG tablet Take 1 tablet (500 mg total) by mouth 2 (two) times daily. 180 tablet 4  . loperamide (IMODIUM) 2 MG capsule Take 2 mg by mouth as needed for diarrhea or loose stools.    . lovastatin (MEVACOR) 40 MG tablet Take 40 mg by mouth every morning.     . metoprolol succinate (TOPROL-XL) 50 MG 24 hr tablet Take 50 mg by mouth 2 (two)  times daily. Take with or immediately following a meal.    . Multiple Vitamin (MULTI-VITAMIN DAILY PO) Take 1 tablet by mouth daily.     . naproxen sodium (ALEVE) 220 MG tablet Take 220 mg by mouth daily as needed (rarely as needed for knee pain).    . rivaroxaban (XARELTO) 20 MG TABS tablet TAKE 1 TABLET BY MOUTH DAILY WITH SUPPER. 90 tablet 3  . sertraline (ZOLOFT) 100 MG tablet Take 100 mg by mouth daily.    . traZODone (DESYREL) 150 MG tablet Take 150 mg by mouth at bedtime.      No current facility-administered medications for this visit.     Family History  Problem Relation Age of Onset  . Cancer Father        stomach and prostate  . Kidney Stones Father   . Hypertension Mother   . Asthma Mother   . Kidney Stones Mother   . Stroke Maternal Uncle   . Heart attack Maternal Uncle   . Hypertension Maternal Uncle   . Cancer Paternal Uncle   . Stroke Maternal Grandfather   . Heart disease Sister        Rapid rhythm  . Skin cancer Sister   . Heart disease Brother        Rapid rhythm  . Skin cancer Brother     Review of Systems  All other systems reviewed and are negative.   Exam:   BP 118/78   Pulse 76   Temp (!) 96.4 F (35.8 C) (Temporal)   Ht 5' 1.5" (1.562 m)   Wt 217 lb (98.4 kg)   LMP 10/26/1999   BMI 40.34 kg/m     Height: 5' 1.5" (156.2 cm)  Ht Readings from Last 3 Encounters:  04/03/19 5' 1.5" (1.562 m)  02/05/19 5\' 2"  (1.575 m)  08/01/18 5' 1.5" (1.562 m)    General appearance: alert, cooperative and appears stated age Head: Normocephalic, without obvious abnormality, atraumatic Neck: no adenopathy, supple, symmetrical, trachea midline and thyroid normal to inspection and palpation Lungs: clear to auscultation bilaterally Breasts: normal appearance, no masses or tenderness Heart: regular rate and rhythm Abdomen: soft, non-tender; bowel sounds normal; no masses,  no organomegaly Extremities: extremities normal, atraumatic, no cyanosis or edema Skin:  Skin color, texture, turgor normal. No rashes or lesions Lymph nodes: Cervical, supraclavicular, and axillary nodes normal. No abnormal inguinal nodes palpated Neurologic: Grossly normal   Pelvic: External genitalia:  no lesions              Urethra:  normal appearing urethra with no masses, tenderness or lesions  Bartholins and Skenes: normal                 Vagina: normal appearing vagina with normal color and discharge, no lesions              Cervix: no lesions              Pap taken: No. Bimanual Exam:  Uterus:  normal size, contour, position, consistency, mobility, non-tender              Adnexa: normal adnexa and no mass, fullness, tenderness               Rectovaginal: Confirms               Anus:  normal sphincter tone, no lesions  Chaperone was present for exam.  A:  Well Woman with normal exam PMP, no HRT Obesity H/O DVT Atrial fibrillation Hypertension Episodic diarrhea Seizure  P:   Mammogram guidelines reviewed.  She is aware this is due.  States she will schedule She is also aware that her colonoscopy is due.  This was to be done in 2018.  Does not want help scheduling this BMD 2018. Has appt with Dr. Drema Dallas in July.  Will have blood work then. AEX 1 year or prn pap smear return annually or prn

## 2019-05-30 ENCOUNTER — Other Ambulatory Visit: Payer: Self-pay

## 2019-05-30 ENCOUNTER — Encounter: Payer: Self-pay | Admitting: Family Medicine

## 2019-05-30 ENCOUNTER — Ambulatory Visit (INDEPENDENT_AMBULATORY_CARE_PROVIDER_SITE_OTHER): Payer: Medicare Other | Admitting: Family Medicine

## 2019-05-30 ENCOUNTER — Ambulatory Visit: Payer: Medicare Other | Admitting: Adult Health

## 2019-05-30 VITALS — BP 121/81 | HR 61 | Temp 96.4°F | Ht 61.5 in | Wt 219.8 lb

## 2019-05-30 DIAGNOSIS — G40909 Epilepsy, unspecified, not intractable, without status epilepticus: Secondary | ICD-10-CM

## 2019-05-30 MED ORDER — LEVETIRACETAM 500 MG PO TABS: 500.0000 mg | ORAL_TABLET | Freq: Two times a day (BID) | ORAL | 4 refills | Status: DC

## 2019-05-30 NOTE — Patient Instructions (Signed)
Continue levetiracetam 500mg  twice daily  Follow up with PCP regularly  Follow up with me in 1 year   Seizure, Adult A seizure is a sudden burst of abnormal electrical activity in the brain. Seizures usually last from 30 seconds to 2 minutes. They can cause many different symptoms. Usually, seizures are not harmful unless they last a long time. What are the causes? Common causes of this condition include:  Fever or infection.  Conditions that affect the brain, such as: ? A brain abnormality that you were born with. ? A brain or head injury. ? Bleeding in the brain. ? A tumor. ? Stroke. ? Brain disorders such as autism or cerebral palsy.  Low blood sugar.  Conditions that are passed from parent to child (are inherited).  Problems with substances, such as: ? Having a reaction to a drug or a medicine. ? Suddenly stopping the use of a substance (withdrawal). In some cases, the cause may not be known. A person who has repeated seizures over time without a clear cause has a condition called epilepsy. What increases the risk? You are more likely to get this condition if you have:  A family history of epilepsy.  Had a seizure in the past.  A brain disorder.  A history of head injury, lack of oxygen at birth, or strokes. What are the signs or symptoms? There are many types of seizures. The symptoms vary depending on the type of seizure you have. Examples of symptoms during a seizure include:  Shaking (convulsions).  Stiffness in the body.  Passing out (losing consciousness).  Head nodding.  Staring.  Not responding to sound or touch.  Loss of bladder control and bowel control. Some people have symptoms right before and right after a seizure happens. Symptoms before a seizure may include:  Fear.  Worry (anxiety).  Feeling like you may vomit (nauseous).  Feeling like the room is spinning (vertigo).  Feeling like you saw or heard something before (dj vu).   Odd tastes or smells.  Changes in how you see. You may see flashing lights or spots. Symptoms after a seizure happens can include:  Confusion.  Sleepiness.  Headache.  Weakness on one side of the body. How is this treated? Most seizures will stop on their own in under 5 minutes. In these cases, no treatment is needed. Seizures that last longer than 5 minutes will usually need treatment. Treatment can include:  Medicines given through an IV tube.  Avoiding things that are known to cause your seizures. These can include medicines that you take for another condition.  Medicines to treat epilepsy.  Surgery to stop the seizures. This may be needed if medicines do not help. Follow these instructions at home: Medicines  Take over-the-counter and prescription medicines only as told by your doctor.  Do not eat or drink anything that may keep your medicine from working, such as alcohol. Activity  Do not do any activities that would be dangerous if you had another seizure, like driving or swimming. Wait until your doctor says it is safe for you to do them.  If you live in the [place] ask your local DMV (department of motor vehicles) when you can drive.  Get plenty of rest. Teaching others Teach friends and family what to do when you have a seizure. They should:  Lay you on the ground.  Protect your head and body.  Loosen any tight clothing around your neck.  Turn you on your side.  Not  hold you down.  Not put anything into your mouth.  Know whether or not you need emergency care.  Stay with you until you are better.  General instructions  Contact your doctor each time you have a seizure.  Avoid anything that gives you seizures.  Keep a seizure diary. Write down: ? What you think caused each seizure. ? What you remember about each seizure.  Keep all follow-up visits as told by your doctor. This is important. Contact a doctor if:  You have another seizure.  You  have seizures more often.  There is any change in what happens during your seizures.  You keep having seizures with treatment.  You have symptoms of being sick or having an infection. Get help right away if:  You have a seizure that: ? Lasts longer than 5 minutes. ? Is different than seizures you had before. ? Makes it harder to breathe. ? Happens after you hurt your head.  You have any of these symptoms after a seizure: ? Not being able to speak. ? Not being able to use a part of your body. ? Confusion. ? A bad headache.  You have two or more seizures in a row.  You do not wake up right after a seizure.  You get hurt during a seizure. These symptoms may be an emergency. Do not wait to see if the symptoms will go away. Get medical help right away. Call your local emergency services (911 in the [place] Do not drive yourself to the hospital. Summary  Seizures usually last from 30 seconds to 2 minutes. Usually, they are not harmful unless they last a long time.  Do not eat or drink anything that may keep your medicine from working, such as alcohol.  Teach friends and family what to do when you have a seizure.  Contact your doctor each time you have a seizure. This information is not intended to replace advice given to you by your health care provider. Make sure you discuss any questions you have with your health care provider. Document Released: 03/29/2008 Document Revised: 12/29/2018 Document Reviewed: 12/29/2018 Elsevier Patient Education  [place].

## 2019-05-30 NOTE — Progress Notes (Signed)
PATIENT: Ashley Pacheco DOB: 1946-08-07  REASON FOR VISIT: follow up HISTORY FROM: patient  Chief Complaint  Patient presents with  . Follow-up    Yearly f/u. Alone. Rm 2. No concerns at this time.      HISTORY OF PRESENT ILLNESS: Today 05/30/19 Ashley Pacheco is a 73 y.o. female here today for follow up of seizure. She continues levetiracetam 500mg  twice daily. She denies seizure activity. Last seizure in 2017. She reports that headaches have resolved. She rarely has one. She is doing well and without complaints.   HISTORY: (copied from Dr Gladstone Lighter note on 05/24/2018)  UPDATE (05/24/18, VRP): Since last visit, doing well. No seizures. However, now with new HA x 2-3 months, 2 per week, occipital. No other sxs. No similar HA in the past. Severity is mild. No alleviating or aggravating factors. Tolerating levetiracetam.    UPDATE 04/01/17: Since last visit, was in [place] in April 2018, dx'd with afib and RVR, now on xarelto and diltiazem. Now doing well. No seizures. Tolerating LEV.   UPDATE 10/01/16: Since last visit, doing well. No sz. Tolerating LEV. No other abnl spells. She has some snoring, but no sig daytime fatigue. She is concerned about risk of stroke due to her family history.   PRIOR HPI (08/13/16): 73 year old right-handed female here for evaluation of possible seizure. Patient has history of hypertension, hyperglycemia depression and heart flutter. 08/10/16, patient was at home playing solitaire on her tablet. She was sitting in a lounge chair where she sometimes takes a nap in the afternoon. Around 2 PM patient noticed that this screen on the tablet was "wavy". Patient looked away from the tablet and saw an afterimage of the tablet follow her visual tracking to the right side. Patient then tried to use the TV remote to turn off the television. She tried pressing a few buttons was unable to turn off the TV. The next thing she remembered she was waking up, noticed that the TV  remote was on the floor and the television was still on. She then noticed that her tongue and throat were very sore. She got up and looked in the mirror she found that she had severely bitten her tongue on both sides. No incontinence. No definite postictal confusion. She did not feel sore in her arms or legs. In retrospect patient had a similar but minor event in spring of 2017 where she was using her tablet and saw a "wavy screen" lasting for approximately 1 minute. She did not have tongue biting or any other symptoms with that episode. Patient has normal worth and development mental history. No history of seizure in the past. No head trauma, meningitis or encephalitis in the past. No family history of seizure. Patient lives alone, is fairly active, takes care of all ADLs. No recent change in medication, alcohol, sleep, stress or other events.  REVIEW OF SYSTEMS: Out of a complete 14 system review of symptoms, the patient complains only of the following symptoms, memory loss and weakness and all other reviewed systems are negative.  ALLERGIES: Allergies  Allergen Reactions  . Adhesive [Tape] Rash    HOME MEDICATIONS: Outpatient Medications Prior to Visit  Medication Sig Dispense Refill  . Cholecalciferol (VITAMIN D) 2000 UNITS tablet Take 2,000 Units by mouth daily.     [name] Cod Liver Oil 1000 MG CAPS Take by mouth.    . digoxin (LANOXIN) 0.125 MG tablet Take 1 tablet (0.125 mg total) by mouth daily. 90 tablet 3  .  diphenhydrAMINE (BENADRYL ALLERGY) 25 MG tablet Take 25 mg by mouth as needed (allergies).     . Ferrous Gluconate (IRON 27 PO) Take by mouth daily.    . furosemide (LASIX) 40 MG tablet TAKE 1 TABLET BY MOUTH EVERY DAY 90 tablet 1  . HYDROcodone-acetaminophen (NORCO/VICODIN) 5-325 MG tablet Take 1 tablet by mouth every 6 (six) hours as needed for moderate pain.    [name] loperamide (IMODIUM) 2 MG capsule Take 2 mg by mouth as needed for diarrhea or loose stools.    . lovastatin (MEVACOR) 40  MG tablet Take 40 mg by mouth every morning.     . metoprolol succinate (TOPROL-XL) 50 MG 24 hr tablet Take 50 mg by mouth 2 (two) times daily. Take with or immediately following a meal.    . Multiple Vitamin (MULTI-VITAMIN DAILY PO) Take 1 tablet by mouth daily.     . naproxen sodium (ALEVE) 220 MG tablet Take 220 mg by mouth daily as needed (rarely as needed for knee pain).    . rivaroxaban (XARELTO) 20 MG TABS tablet TAKE 1 TABLET BY MOUTH DAILY WITH SUPPER. 90 tablet 3  . sertraline (ZOLOFT) 100 MG tablet Take 100 mg by mouth daily.    . traZODone (DESYREL) 150 MG tablet Take 150 mg by mouth at bedtime.     . levETIRAcetam (KEPPRA) 500 MG tablet Take 1 tablet (500 mg total) by mouth 2 (two) times daily. 180 tablet 4   No facility-administered medications prior to visit.     PAST MEDICAL HISTORY: Past Medical History:  Diagnosis Date  . Aortic stenosis 05/18/2017  . 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 Atrial fibrillation ([place])   . Atrial fibrillation with rapid ventricular response ([place]) 02/14/2017  . Atrial fibrillation with RVR ([place]) 11/15/2017  . Atypical chest pain    a. 2015 Neg MV.  . Cardiomegaly 2005  . Cardiomyopathy ([place])    a. 01/2017 Echo: EF 45%, diff HK, mild LVH, triv AI, mild MR, mod dil LA, mildly dil RA, PASP 49mmHg - in setting of Afib.  [name] DVT (deep venous thrombosis) ([place]) 02/2004  . Fibroid   . Hypercholesteremia   . Hypertension   . Menometrorrhagia 3/92   menopausal  . Microhematuria 1/96  . Mitral regurgitation 05/18/2017  . Nephrolithiasis 2015  . PAF (paroxysmal atrial fibrillation) ([place])    a. 01/2017 TEE/DCCV: EF 45-50%, no LA/LAA thrombus-->successful DCCV @ 150 J. CHA2DS2VASc = 3-->Xarelto.  . Seizure disorder ([place]) 02/17/2017  . Seizures ([place])    followed by [facility]- Dr. Leta Baptist  . SUI (stress urinary incontinence, female) 9/91  . Vulvar cyst 7/90    PAST SURGICAL HISTORY: Past Surgical History:  Procedure Laterality Date  . APPENDECTOMY   1950  . BUNIONECTOMY  7/11   second toe  . CARDIOVERSION N/A 02/17/2017   Procedure: CARDIOVERSION;  Surgeon: Skeet Latch, MD;  Location: [facility];  Service: Cardiovascular;  Laterality: N/A;  . cataracts  3/09 4/09  . DILATION AND CURETTAGE OF UTERUS     hystreroscopy/imcomplete polypectomy  . KNEE SURGERY  10/13 or 11/13   right knee arthroscopy  . TEE WITHOUT CARDIOVERSION N/A 02/17/2017   Procedure: TRANSESOPHAGEAL ECHOCARDIOGRAM (TEE);  Surgeon: Skeet Latch, MD;  Location: [facility];  Service: Cardiovascular;  Laterality: N/A;  . [place]  . TUBAL LIGATION  1975   BTL    FAMILY HISTORY: Family History  Problem Relation Age of Onset  . Cancer Father  stomach and prostate  . Kidney Stones Father   . Hypertension Mother   . Asthma Mother   . Kidney Stones Mother   . Stroke Maternal Uncle   . Heart attack Maternal Uncle   . Hypertension Maternal Uncle   . Cancer Paternal Uncle   . Stroke Maternal Grandfather   . Heart disease Sister        Rapid rhythm  . Skin cancer Sister   . Heart disease Brother        Rapid rhythm  . Skin cancer Brother     SOCIAL HISTORY: Social History   Socioeconomic History  . Marital status: Widowed    Spouse name: Not on file  . Number of children: 2  . Years of education: 44  . Highest education level: Not on file  Occupational History    Comment: volunteer  Social Needs  . Financial resource strain: Not on file  . Food insecurity    Worry: Not on file    Inability: Not on file  . Transportation needs    Medical: Not on file    Non-medical: Not on file  Tobacco Use  . Smoking status: Former Smoker    Types: Cigarettes  . Smokeless tobacco: Never Used  . Tobacco comment: quit in 1969  Substance and Sexual Activity  . Alcohol use: Yes    Alcohol/week: 7.0 standard drinks    Types: 7 Glasses of wine per week  . Drug use: No  . Sexual activity: Not Currently    Partners:  Male  Lifestyle  . Physical activity    Days per week: Not on file    Minutes per session: Not on file  . Stress: Not on file  Relationships  . Social Herbalist on phone: Not on file    Gets together: Not on file    Attends religious service: Not on file    Active member of club or organization: Not on file    Attends meetings of clubs or organizations: Not on file    Relationship status: Not on file  . Intimate partner violence    Fear of current or ex partner: Not on file    Emotionally abused: Not on file    Physically abused: Not on file    Forced sexual activity: Not on file  Other Topics Concern  . Not on file  Social History Narrative   Lives alone   Caffeine- coffee- 3 mugs daily      PHYSICAL EXAM  Vitals:   05/30/19 1032  BP: 121/81  Pulse: 61  Temp: (!) 96.4 F (35.8 C)  TempSrc: Oral  Weight: 219 lb 12.8 oz (99.7 kg)  Height: 5' 1.5" (1.562 m)   Body mass index is 40.86 kg/m.  Generalized: Well developed, in no acute distress  Neurological examination  Mentation: Alert oriented to time, place, history taking. Follows all commands speech and language fluent Cranial nerve II-XII: Pupils were equal round reactive to light. Extraocular movements were full, visual field were full on confrontational test. Facial sensation and strength were normal. Uvula tongue midline. Head turning and shoulder shrug  were normal and symmetric. Motor: The motor testing reveals 5 over 5 strength of all 4 extremities. Good symmetric motor tone is noted throughout.  Coordination: Cerebellar testing reveals good finger-nose-finger and heel-to-shin bilaterally.  Gait and station: Gait is normal.   DIAGNOSTIC DATA (LABS, IMAGING, TESTING) - I reviewed patient records, labs, notes, testing and imaging myself where  available.  No flowsheet data found.   Lab Results  Component Value Date   WBC 7.9 04/19/2018   HGB 13.4 04/19/2018   HCT 40.4 04/19/2018   MCV 82  04/19/2018   PLT 194 04/19/2018      Component Value Date/Time   NA 144 04/19/2018 1034   K 4.0 04/19/2018 1034   CL 104 04/19/2018 1034   CO2 27 04/19/2018 1034   GLUCOSE 108 (H) 04/19/2018 1034   GLUCOSE 106 (H) 11/15/2017 1725   BUN 10 04/19/2018 1034   CREATININE 0.88 04/19/2018 1034   CALCIUM 8.8 04/19/2018 1034   PROT 6.0 04/19/2018 1034   ALBUMIN 3.9 04/19/2018 1034   AST 14 04/19/2018 1034   ALT 13 04/19/2018 1034   ALKPHOS 83 04/19/2018 1034   BILITOT 0.4 04/19/2018 1034   GFRNONAA 66 04/19/2018 1034   GFRAA 76 04/19/2018 1034   Lab Results  Component Value Date   CHOL 166 04/19/2018   HDL 35 (L) 04/19/2018   LDLCALC 103 (H) 04/19/2018   TRIG 140 04/19/2018   CHOLHDL 4.7 (H) 04/19/2018   Lab Results  Component Value Date   HGBA1C 5.5 11/15/2017   No results found for: [id number] Lab Results  Component Value Date   TSH 1.940 04/19/2018     ASSESSMENT AND PLAN 73 y.o. year old female  has a past medical history of Aortic stenosis (05/18/2017), Arthritis, Atrial fibrillation ([place]), Atrial fibrillation with rapid ventricular response ([place]) (02/14/2017), Atrial fibrillation with RVR ([place]) (11/15/2017), Atypical chest pain, Cardiomegaly (2005), Cardiomyopathy ([place]), DVT (deep venous thrombosis) ([place]) (02/2004), Fibroid, Hypercholesteremia, Hypertension, Menometrorrhagia (3/92), Microhematuria (1/96), Mitral regurgitation (05/18/2017), Nephrolithiasis (2015), PAF (paroxysmal atrial fibrillation) ([place]), Seizure disorder ([place]) (02/17/2017), Seizures ([place]), SUI (stress urinary incontinence, female) (9/91), and Vulvar cyst (7/90). here with     ICD-10-CM   1. Seizure disorder ([place])  G40.909     She continues to do well with levetiracetam 500mg  twice daily. No seizure activity. Headaches have resolved. She was encouraged to continue current therapy. Advised regular follow up with PCP. Annual follow up with neurology. She verbalized understanding and agreement with this plan.     No orders of the defined types were placed in this encounter.    Meds ordered this encounter  Medications  . levETIRAcetam (KEPPRA) 500 MG tablet    Sig: Take 1 tablet (500 mg total) by mouth 2 (two) times daily.    Dispense:  180 tablet    Refill:  4    Order Specific Question:   Supervising Provider    Answer:   Melvenia Beam [id number]      I spent 15 minutes with the patient. 50% of this time was spent counseling and educating patient on plan of care and medications.    Debbora Presto, FNP-C 05/30/2019, 4:37 PM [facility] [address] [place], [place] [address] [phone]

## 2019-06-13 NOTE — Progress Notes (Signed)
I reviewed note and agree with plan.   Penni Bombard, MD 1/47/0929, 5:74 PM Certified in Neurology, Neurophysiology and Neuroimaging  [facility] [address] [place], [place] [address] [phone]

## 2019-07-11 ENCOUNTER — Other Ambulatory Visit: Payer: Self-pay | Admitting: Nurse Practitioner

## 2019-07-20 ENCOUNTER — Other Ambulatory Visit: Payer: Self-pay | Admitting: Cardiology

## 2019-08-13 DIAGNOSIS — Z23 Encounter for immunization: Secondary | ICD-10-CM | POA: Diagnosis not present

## 2019-09-24 DIAGNOSIS — G479 Sleep disorder, unspecified: Secondary | ICD-10-CM | POA: Diagnosis not present

## 2019-09-24 DIAGNOSIS — I5022 Chronic systolic (congestive) heart failure: Secondary | ICD-10-CM | POA: Diagnosis not present

## 2019-09-24 DIAGNOSIS — E7800 Pure hypercholesterolemia, unspecified: Secondary | ICD-10-CM | POA: Diagnosis not present

## 2019-09-24 DIAGNOSIS — I4819 Other persistent atrial fibrillation: Secondary | ICD-10-CM | POA: Diagnosis not present

## 2019-09-24 DIAGNOSIS — G40909 Epilepsy, unspecified, not intractable, without status epilepticus: Secondary | ICD-10-CM | POA: Diagnosis not present

## 2019-09-24 DIAGNOSIS — F339 Major depressive disorder, recurrent, unspecified: Secondary | ICD-10-CM | POA: Diagnosis not present

## 2019-09-24 DIAGNOSIS — E559 Vitamin D deficiency, unspecified: Secondary | ICD-10-CM | POA: Diagnosis not present

## 2019-09-24 DIAGNOSIS — I10 Essential (primary) hypertension: Secondary | ICD-10-CM | POA: Diagnosis not present

## 2019-09-24 DIAGNOSIS — Z7901 Long term (current) use of anticoagulants: Secondary | ICD-10-CM | POA: Diagnosis not present

## 2019-09-24 DIAGNOSIS — R296 Repeated falls: Secondary | ICD-10-CM | POA: Diagnosis not present

## 2019-09-24 DIAGNOSIS — R739 Hyperglycemia, unspecified: Secondary | ICD-10-CM | POA: Diagnosis not present

## 2019-10-02 ENCOUNTER — Encounter: Payer: Self-pay | Admitting: Cardiology

## 2019-10-02 ENCOUNTER — Other Ambulatory Visit: Payer: Self-pay

## 2019-10-02 ENCOUNTER — Ambulatory Visit (INDEPENDENT_AMBULATORY_CARE_PROVIDER_SITE_OTHER): Payer: Medicare Other | Admitting: Cardiology

## 2019-10-02 VITALS — BP 132/84 | HR 104 | Ht 61.5 in | Wt 220.8 lb

## 2019-10-02 DIAGNOSIS — I4821 Permanent atrial fibrillation: Secondary | ICD-10-CM | POA: Diagnosis not present

## 2019-10-02 DIAGNOSIS — G40909 Epilepsy, unspecified, not intractable, without status epilepticus: Secondary | ICD-10-CM

## 2019-10-02 NOTE — Patient Instructions (Signed)
Medication Instructions:  The current medical regimen is effective;  continue present plan and medications.  *If you need a refill on your cardiac medications before your next appointment, please call your pharmacy*  Follow-Up: At [facility], you and your health needs are our priority.  As part of our continuing mission to provide you with exceptional heart care, we have created designated Provider Care Teams.  These Care Teams include your primary Cardiologist (physician) and Advanced Practice Providers (APPs -  Physician Assistants and Nurse Practitioners) who all work together to provide you with the care you need, when you need it.  Your next appointment:   12 month(s)  The format for your next appointment:   In Person  Provider:   Mark Skains, MD   Thank you for choosing [facility]!!     

## 2019-10-02 NOTE — Progress Notes (Signed)
Cardiology Office Note:    Date:  10/02/2019   ID:  Ashley Pacheco, DOB 07-04-1946, MRN [id number]  PCP:  Leighton Ruff, MD  Cardiologist:  Candee Furbish, MD  Electrophysiologist:  None   Referring MD: Leighton Ruff, MD     History of Present Illness:    Ashley Pacheco is a 73 y.o. female with paroxysmal atrial fibrillation follow-up.  04/22/2018- in review of her note, was found to be in atrial fibrillation asymptomatic rate controlled with EF 40 to 45% and she was admitted subsequently with rapid ventricular response and EP was consulted and once again recommended rate control strategy with pacemaker consideration AV nodal ablation only if absolutely necessary.  Repeat cardioversion was not recommended because she was symptom-free.  She was changed from IV Cardizem to Lopressor and loaded with IV digoxin at that time.  Very difficult to control and her Lopressor was increased to 50 twice a day.  Nuclear stress test was performed on 11/19/2017 and showed no ischemia EF was 40 to 45% on echocardiogram, nuclear 34.  Previously she was quite fatigued short of breath unchanged.  2 naps a day.  Very sedentary.  Knee pain.  10/02/2019-here for atrial fibrillation follow-up.  Permanent atrial fibrillation rate controlled.  Rate is usually around 100.  On digoxin and metoprolol.  She has had a couple falls this past year, balance issues.  Carrying in groceries for instance.  Being careful.  Denies any fevers chills nausea vomiting syncope bleeding.  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 2015 Neg MV.   Cardiomegaly 2005   Cardiomyopathy [facility])    a. 01/2017 Echo: EF 45%, diff HK, mild LVH, triv AI, mild MR, mod dil LA, mildly dil RA, PASP 9mmHg - in  setting of Afib.   DVT (deep venous thrombosis) ([place]) 02/2004   Fibroid    Hypercholesteremia    Hypertension    Menometrorrhagia 3/92   menopausal   Microhematuria 1/96   Mitral regurgitation 05/18/2017   Nephrolithiasis 2015   PAF (paroxysmal atrial fibrillation) ([place])    a. 01/2017 TEE/DCCV: EF 45-50%, no LA/LAA thrombus-->successful DCCV @ 150 J. CHA2DS2VASc = 3-->Xarelto.   Seizure disorder ([place]) 02/17/2017   Seizures ([place])    followed by [facility] Neurological- Dr. Leta Baptist   SUI (stress urinary incontinence, female) 9/91   Vulvar cyst 7/90    Past Surgical History:  Procedure Laterality Date   APPENDECTOMY  1950   BUNIONECTOMY  7/11   second toe   CARDIOVERSION N/A 02/17/2017   Procedure: CARDIOVERSION;  Surgeon: Skeet Latch, MD;  Location: [place];  Service: Cardiovascular;  Laterality: N/A;   cataracts  3/09 4/09   DILATION AND CURETTAGE OF UTERUS     hystreroscopy/imcomplete polypectomy   KNEE SURGERY  10/13 or 11/13   right knee arthroscopy   TEE WITHOUT CARDIOVERSION N/A 02/17/2017   Procedure: TRANSESOPHAGEAL ECHOCARDIOGRAM (TEE);  Surgeon: Skeet Latch, MD;  Location: [facility] ENDOSCOPY;  Service: Cardiovascular;  Laterality: N/A;   TONSILLECTOMY AND [place]   BTL    Current Medications: Current Meds  Medication Sig   Cholecalciferol (VITAMIN D) 2000 UNITS tablet Take 2,000 Units by mouth daily.    [place] Liver  Oil 1000 MG CAPS Take by mouth.   digoxin (LANOXIN) 0.125 MG tablet Take 1 tablet (0.125 mg total) by mouth daily.   diphenhydrAMINE (BENADRYL ALLERGY) 25 MG tablet Take 25 mg by mouth as needed (allergies).    Ferrous Gluconate (IRON 27 PO) Take by mouth daily.   furosemide (LASIX) 40 MG tablet TAKE 1 TABLET BY MOUTH EVERY DAY   HYDROcodone-acetaminophen (NORCO/VICODIN) 5-325 MG tablet Take 1 tablet by mouth every 6 (six) hours as needed for moderate pain.   levETIRAcetam (KEPPRA) 500  MG tablet Take 1 tablet (500 mg total) by mouth 2 (two) times daily.   loperamide (IMODIUM) 2 MG capsule Take 2 mg by mouth as needed for diarrhea or loose stools.   lovastatin (MEVACOR) 40 MG tablet Take 40 mg by mouth every morning.    metoprolol succinate (TOPROL-XL) 50 MG 24 hr tablet TAKE 1 TABLET (50 MG TOTAL) BY MOUTH 2 (TWO) TIMES DAILY. TAKE WITH OR IMMEDIATELY FOLLOWING A MEAL.   Multiple Vitamin (MULTI-VITAMIN DAILY PO) Take 1 tablet by mouth daily.    naproxen sodium (ALEVE) 220 MG tablet Take 220 mg by mouth daily as needed (rarely as needed for knee pain).   rivaroxaban (XARELTO) 20 MG TABS tablet TAKE 1 TABLET BY MOUTH DAILY WITH SUPPER.   sertraline (ZOLOFT) 100 MG tablet Take 100 mg by mouth daily.   traZODone (DESYREL) 150 MG tablet Take 150 mg by mouth at bedtime.      Allergies:   Adhesive [tape]   Social History   Socioeconomic History   Marital status: Widowed    Spouse name: Not on file   Number of children: 2   Years of education: 16   Highest education level: Not on file  Occupational History    Comment: Art gallery manager strain: Not on file   Food insecurity    Worry: Not on file    Inability: Not on file   Transportation needs    Medical: Not on file    Non-medical: Not on file  Tobacco Use   Smoking status: Former Smoker    Types: Cigarettes   Smokeless tobacco: Never Used   Tobacco comment: quit in 1969  Substance and Sexual Activity   Alcohol use: Yes    Alcohol/week: 7.0 standard drinks    Types: 7 Glasses of wine per week   Drug use: No   Sexual activity: Not Currently    Partners: Male  Lifestyle   Physical activity    Days per week: Not on file    Minutes per session: Not on file   Stress: Not on file  Relationships   Social connections    Talks on phone: Not on file    Gets together: Not on file    Attends religious service: Not on file    Active member of club or organization:  Not on file    Attends meetings of clubs or organizations: Not on file    Relationship status: Not on file  Other Topics Concern   Not on file  Social History Narrative   Lives alone   Caffeine- coffee- 3 mugs daily     Family History: The patient's family history includes Asthma in her mother; Cancer in her father and paternal uncle; Heart attack in her maternal uncle; Heart disease in her brother and sister; Hypertension in her maternal uncle and mother; Kidney Stones in her father and mother; Skin cancer in her brother and sister; Stroke  in her maternal grandfather and maternal uncle.  ROS:   Please see the history of present illness.    No fevers chills nausea vomiting syncope bleeding all other systems reviewed and are negative.  EKGs/Labs/Other Studies Reviewed:    The following studies were reviewed today:  05/17/2018 echocardiogram: - Left ventricle: The cavity size was normal. There was moderate   concentric hypertrophy. Systolic function was mildly to   moderately reduced. The estimated ejection fraction was in the   range of 40% to 45%. Global hypokinesis with more severe apical   hypokinesis. The study is not technically sufficient to allow   evaluation of LV diastolic function. - Aortic valve: Mildly calcified leaflets. Mild stenosis. Trivial   regurgitation. Mean gradient (S): 6 mm Hg. - Mitral valve: Mildly thickened leaflets . There was trivial   regurgitation. - Left atrium: Severely dilated. - Right ventricle: The cavity size was normal. Wall thickness was   normal. Systolic function appears mildly decreased. - Right atrium: The atrium was mildly dilated. - Tricuspid valve: There was mild regurgitation. - Pulmonary arteries: PA peak pressure: 28 mm Hg (S). - Inferior vena cava: The vessel was normal in size. The   respirophasic diameter changes were in the normal range (>= 50%),   consistent with normal central venous pressure.  Impressions:  - Compared  to a prior study in 04/2017, there are no significant   changes. The mean aortic valve gradient is likely undersampled in   this study.  EKG: 10/02/2019-A. fib RVR 104 with nonspecific ST-T wave changes personally reviewed-prior prior 11/15/2017- heart rate 130 A. fib RVR  Recent Labs: No results found for requested labs within last 8760 hours.  Recent Lipid Panel    Component Value Date/Time   CHOL 166 04/19/2018 1034   TRIG 140 04/19/2018 1034   HDL 35 (L) 04/19/2018 1034   CHOLHDL 4.7 (H) 04/19/2018 1034   LDLCALC 103 (H) 04/19/2018 1034    Physical Exam:    VS:  BP 132/84    Pulse (!) 104    Ht 5' 1.5" (1.562 m)    Wt 220 lb 12.8 oz (100.2 kg)    LMP 10/26/1999    BMI 41.04 kg/m     Wt Readings from Last 3 Encounters:  10/02/19 220 lb 12.8 oz (100.2 kg)  05/30/19 219 lb 12.8 oz (99.7 kg)  04/03/19 217 lb (98.4 kg)     GEN: Well nourished, well developed, in no acute distress  HEENT: normal  Neck: no JVD, carotid bruits, or masses Cardiac: irreg; no murmurs, rubs, or gallops,no edema  Respiratory:  clear to auscultation bilaterally, normal work of breathing GI: soft, nontender, nondistended, + BS MS: no deformity or atrophy  Skin: warm and dry, no rash Neuro:  Alert and Oriented x 3, Strength and sensation are intact Psych: euthymic mood, full affect   ASSESSMENT:    1. Permanent atrial fibrillation ([place])   2. Morbid obesity ([place])   3. Seizure disorder ([place])    PLAN:    In order of problems listed above:  Permanent atrial fibrillation -Rate control has been challenging but currently on regimen she seems to be doing quite well.  Medications reviewed as above.  Includes digoxin.  No changes made today.  Stable.  Chronic anticoagulation -Xarelto 20 mg.  Last creatinine 0.7, hemoglobin 13.4.  No signs of bleeding.  No strokelike symptoms.  Stable.  She has sustained a few falls.  Being careful.  Easy bruising.  Seizure disorder -  Dr. Leta Baptist  monitoring  Morbid obesity -Continue to encourage weight loss, decrease carbohydrates.  Challenging to do with knee pain.  Perhaps water aerobics.  She states that she has lost about 12 pounds since last year.  Excellent.  Continue to work on this.  Chronic systolic heart failure -Ejection fraction moderately reduced to 45%.  Stable.  Nuclear stress test with no ischemia.  Continue with Toprol.  Unable to utilize ACE inhibitor because of hypotension previously.  No changes made.   Medication Adjustments/Labs and Tests Ordered: Current medicines are reviewed at length with the patient today.  Concerns regarding medicines are outlined above.  Orders Placed This Encounter  Procedures   EKG 12/Charge capture   No orders of the defined types were placed in this encounter.   Patient Instructions  Medication Instructions:  The current medical regimen is effective;  continue present plan and medications.  *If you need a refill on your cardiac medications before your next appointment, please call your pharmacy*  Follow-Up: At [facility], you and your health needs are our priority.  As part of our continuing mission to provide you with exceptional heart care, we have created designated Provider Care Teams.  These Care Teams include your primary Cardiologist (physician) and Advanced Practice Providers (APPs -  Physician Assistants and Nurse Practitioners) who all work together to provide you with the care you need, when you need it.  Your next appointment:   12 month(s)  The format for your next appointment:   In Person  Provider:   Candee Furbish, MD  Thank you for choosing [facility]!!        Signed, Candee Furbish, MD  10/02/2019 9:49 AM    [place]

## 2019-10-03 DIAGNOSIS — L658 Other specified nonscarring hair loss: Secondary | ICD-10-CM | POA: Diagnosis not present

## 2019-10-03 DIAGNOSIS — L570 Actinic keratosis: Secondary | ICD-10-CM | POA: Diagnosis not present

## 2019-10-03 DIAGNOSIS — L218 Other seborrheic dermatitis: Secondary | ICD-10-CM | POA: Diagnosis not present

## 2019-10-03 DIAGNOSIS — X32XXXA Exposure to sunlight, initial encounter: Secondary | ICD-10-CM | POA: Diagnosis not present

## 2019-10-03 DIAGNOSIS — L820 Inflamed seborrheic keratosis: Secondary | ICD-10-CM | POA: Diagnosis not present

## 2019-12-18 DIAGNOSIS — R7303 Prediabetes: Secondary | ICD-10-CM | POA: Diagnosis not present

## 2020-01-23 ENCOUNTER — Other Ambulatory Visit: Payer: Self-pay | Admitting: Cardiology

## 2020-02-24 ENCOUNTER — Other Ambulatory Visit: Payer: Self-pay | Admitting: Nurse Practitioner

## 2020-02-25 NOTE — Telephone Encounter (Signed)
Pt last saw Dr Marlou Porch 10/02/19, last labs 09/24/19 Creat 0.79, age 74, weight 100.2kg, CrCl 98.83, based on CrCl pt is on appropriate dosage of Xarelto 20mg  QD.  Will refill rx.

## 2020-04-03 DIAGNOSIS — Z7901 Long term (current) use of anticoagulants: Secondary | ICD-10-CM | POA: Diagnosis not present

## 2020-04-03 DIAGNOSIS — I10 Essential (primary) hypertension: Secondary | ICD-10-CM | POA: Diagnosis not present

## 2020-04-03 DIAGNOSIS — I4819 Other persistent atrial fibrillation: Secondary | ICD-10-CM | POA: Diagnosis not present

## 2020-04-03 DIAGNOSIS — M2550 Pain in unspecified joint: Secondary | ICD-10-CM | POA: Diagnosis not present

## 2020-04-03 DIAGNOSIS — E559 Vitamin D deficiency, unspecified: Secondary | ICD-10-CM | POA: Diagnosis not present

## 2020-04-03 DIAGNOSIS — G479 Sleep disorder, unspecified: Secondary | ICD-10-CM | POA: Diagnosis not present

## 2020-04-03 DIAGNOSIS — I5022 Chronic systolic (congestive) heart failure: Secondary | ICD-10-CM | POA: Diagnosis not present

## 2020-04-03 DIAGNOSIS — F339 Major depressive disorder, recurrent, unspecified: Secondary | ICD-10-CM | POA: Diagnosis not present

## 2020-04-03 DIAGNOSIS — E7800 Pure hypercholesterolemia, unspecified: Secondary | ICD-10-CM | POA: Diagnosis not present

## 2020-04-03 DIAGNOSIS — R7303 Prediabetes: Secondary | ICD-10-CM | POA: Diagnosis not present

## 2020-06-02 ENCOUNTER — Encounter: Payer: Self-pay | Admitting: Family Medicine

## 2020-06-02 ENCOUNTER — Ambulatory Visit (INDEPENDENT_AMBULATORY_CARE_PROVIDER_SITE_OTHER): Payer: Medicare Other | Admitting: Family Medicine

## 2020-06-02 ENCOUNTER — Other Ambulatory Visit: Payer: Self-pay

## 2020-06-02 VITALS — BP 138/90 | HR 90 | Ht 61.5 in | Wt 218.0 lb

## 2020-06-02 DIAGNOSIS — R296 Repeated falls: Secondary | ICD-10-CM

## 2020-06-02 DIAGNOSIS — G40909 Epilepsy, unspecified, not intractable, without status epilepticus: Secondary | ICD-10-CM

## 2020-06-02 DIAGNOSIS — R2689 Other abnormalities of gait and mobility: Secondary | ICD-10-CM

## 2020-06-02 MED ORDER — LEVETIRACETAM 500 MG PO TABS: 500.0000 mg | ORAL_TABLET | Freq: Two times a day (BID) | ORAL | 4 refills | Status: DC

## 2020-06-02 NOTE — Patient Instructions (Addendum)
Continue levetiracetam 500mg  twice daily.   Please continue conversation of feeling imbalanced with PCP. I would suggest you consider PT for an evaluation. Medications can also cause dizziness. Discuss any concerns with PCP.   Stay well hydrated and continues healthy lifestyle habits.   May follow up with PCP for refills, otherwise, follow up in 1 year, sooner if needed.     Seizure, Adult A seizure is a sudden burst of abnormal electrical activity in the brain. Seizures usually last from 30 seconds to 2 minutes. They can cause many different symptoms. Usually, seizures are not harmful unless they last a long time. What are the causes? Common causes of this condition include:  Fever or infection.  Conditions that affect the brain, such as: ? A brain abnormality that you were born with. ? A brain or head injury. ? Bleeding in the brain. ? A tumor. ? Stroke. ? Brain disorders such as autism or cerebral palsy.  Low blood sugar.  Conditions that are passed from parent to child (are inherited).  Problems with substances, such as: ? Having a reaction to a drug or a medicine. ? Suddenly stopping the use of a substance (withdrawal). In some cases, the cause may not be known. A person who has repeated seizures over time without a clear cause has a condition called epilepsy. What increases the risk? You are more likely to get this condition if you have:  A family history of epilepsy.  Had a seizure in the past.  A brain disorder.  A history of head injury, lack of oxygen at birth, or strokes. What are the signs or symptoms? There are many types of seizures. The symptoms vary depending on the type of seizure you have. Examples of symptoms during a seizure include:  Shaking (convulsions).  Stiffness in the body.  Passing out (losing consciousness).  Head nodding.  Staring.  Not responding to sound or touch.  Loss of bladder control and bowel control. Some people have  symptoms right before and right after a seizure happens. Symptoms before a seizure may include:  Fear.  Worry (anxiety).  Feeling like you may vomit (nauseous).  Feeling like the room is spinning (vertigo).  Feeling like you saw or heard something before (dj vu).  Odd tastes or smells.  Changes in how you see. You may see flashing lights or spots. Symptoms after a seizure happens can include:  Confusion.  Sleepiness.  Headache.  Weakness on one side of the body. How is this treated? Most seizures will stop on their own in under 5 minutes. In these cases, no treatment is needed. Seizures that last longer than 5 minutes will usually need treatment. Treatment can include:  Medicines given through an IV tube.  Avoiding things that are known to cause your seizures. These can include medicines that you take for another condition.  Medicines to treat epilepsy.  Surgery to stop the seizures. This may be needed if medicines do not help. Follow these instructions at home: Medicines  Take over-the-counter and prescription medicines only as told by your doctor.  Do not eat or drink anything that may keep your medicine from working, such as alcohol. Activity  Do not do any activities that would be dangerous if you had another seizure, like driving or swimming. Wait until your doctor says it is safe for you to do them.  If you live in the [place] ask your local DMV (department of motor vehicles) when you can drive.  Get plenty of  rest. Teaching others Teach friends and family what to do when you have a seizure. They should:  Lay you on the ground.  Protect your head and body.  Loosen any tight clothing around your neck.  Turn you on your side.  Not hold you down.  Not put anything into your mouth.  Know whether or not you need emergency care.  Stay with you until you are better.  General instructions  Contact your doctor each time you have a seizure.  Avoid  anything that gives you seizures.  Keep a seizure diary. Write down: ? What you think caused each seizure. ? What you remember about each seizure.  Keep all follow-up visits as told by your doctor. This is important. Contact a doctor if:  You have another seizure.  You have seizures more often.  There is any change in what happens during your seizures.  You keep having seizures with treatment.  You have symptoms of being sick or having an infection. Get help right away if:  You have a seizure that: ? Lasts longer than 5 minutes. ? Is different than seizures you had before. ? Makes it harder to breathe. ? Happens after you hurt your head.  You have any of these symptoms after a seizure: ? Not being able to speak. ? Not being able to use a part of your body. ? Confusion. ? A bad headache.  You have two or more seizures in a row.  You do not wake up right after a seizure.  You get hurt during a seizure. These symptoms may be an emergency. Do not wait to see if the symptoms will go away. Get medical help right away. Call your local emergency services (911 in the [place] Do not drive yourself to the hospital. Summary  Seizures usually last from 30 seconds to 2 minutes. Usually, they are not harmful unless they last a long time.  Do not eat or drink anything that may keep your medicine from working, such as alcohol.  Teach friends and family what to do when you have a seizure.  Contact your doctor each time you have a seizure. This information is not intended to replace advice given to you by your health care provider. Make sure you discuss any questions you have with your health care provider. Document Revised: 12/29/2018 Document Reviewed: 12/29/2018 Elsevier Patient Education  [place] Prevention in the Home, Adult Falls can cause injuries. They can happen to people of all ages. There are many things you can do to make your home safe and to help  prevent falls. Ask for help when making these changes, if needed. What actions can I take to prevent falls? General Instructions  Use good lighting in all rooms. Replace any light bulbs that burn out.  Turn on the lights when you go into a dark area. Use night-lights.  Keep items that you use often in easy-to-reach places. Lower the shelves around your home if necessary.  Set up your furniture so you have a clear path. Avoid moving your furniture around.  Do not have throw rugs and other things on the floor that can make you trip.  Avoid walking on wet floors.  If any of your floors are uneven, fix them.  Add color or contrast paint or tape to clearly mark and help you see: ? Any grab bars or handrails. ? First and last steps of stairways. ? Where the edge of each step is.  If you use a stepladder: ? Make sure that it is fully opened. Do not climb a closed stepladder. ? Make sure that both sides of the stepladder are locked into place. ? Ask someone to hold the stepladder for you while you use it.  If there are any pets around you, be aware of where they are. What can I do in the bathroom?      Keep the floor dry. Clean up any water that spills onto the floor as soon as it happens.  Remove soap buildup in the tub or shower regularly.  Use non-skid mats or decals on the floor of the tub or shower.  Attach bath mats securely with double-sided, non-slip rug tape.  If you need to sit down in the shower, use a plastic, non-slip stool.  Install grab bars by the toilet and in the tub and shower. Do not use towel bars as grab bars. What can I do in the bedroom?  Make sure that you have a light by your bed that is easy to reach.  Do not use any sheets or blankets that are too big for your bed. They should not hang down onto the floor.  Have a firm chair that has side arms. You can use this for support while you get dressed. What can I do in the kitchen?  Clean up any  spills right away.  If you need to reach something above you, use a strong step stool that has a grab bar.  Keep electrical cords out of the way.  Do not use floor polish or wax that makes floors slippery. If you must use wax, use non-skid floor wax. What can I do with my stairs?  Do not leave any items on the stairs.  Make sure that you have a light switch at the top of the stairs and the bottom of the stairs. If you do not have them, ask someone to add them for you.  Make sure that there are handrails on both sides of the stairs, and use them. Fix handrails that are broken or loose. Make sure that handrails are as long as the stairways.  Install non-slip stair treads on all stairs in your home.  Avoid having throw rugs at the top or bottom of the stairs. If you do have throw rugs, attach them to the floor with carpet tape.  Choose a carpet that does not hide the edge of the steps on the stairway.  Check any carpeting to make sure that it is firmly attached to the stairs. Fix any carpet that is loose or worn. What can I do on the outside of my home?  Use bright outdoor lighting.  Regularly fix the edges of walkways and driveways and fix any cracks.  Remove anything that might make you trip as you walk through a door, such as a raised step or threshold.  Trim any bushes or trees on the path to your home.  Regularly check to see if handrails are loose or broken. Make sure that both sides of any steps have handrails.  Install guardrails along the edges of any raised decks and porches.  Clear walking paths of anything that might make someone trip, such as tools or rocks.  Have any leaves, snow, or ice cleared regularly.  Use sand or salt on walking paths during winter.  Clean up any spills in your garage right away. This includes grease or oil spills. What other actions can I take?  Wear  shoes that: ? Have a low heel. Do not wear high heels. ? Have rubber bottoms. ? Are  comfortable and fit you well. ? Are closed at the toe. Do not wear open-toe sandals.  Use tools that help you move around (mobility aids) if they are needed. These include: ? Canes. ? Walkers. ? Scooters. ? Crutches.  Review your medicines with your doctor. Some medicines can make you feel dizzy. This can increase your chance of falling. Ask your doctor what other things you can do to help prevent falls. Where to find more information  Centers for Disease Control and Prevention, STEADI: https://garcia.biz/  [employer] on Aging: BrainJudge.co.uk Contact a doctor if:  You are afraid of falling at home.  You feel weak, drowsy, or dizzy at home.  You fall at home. Summary  There are many simple things that you can do to make your home safe and to help prevent falls.  Ways to make your home safe include removing tripping hazards and installing grab bars in the bathroom.  Ask for help when making these changes in your home. This information is not intended to replace advice given to you by your health care provider. Make sure you discuss any questions you have with your health care provider. Document Revised: 02/01/2019 Document Reviewed: 05/26/2017 Elsevier Patient Education  2020 [employer].

## 2020-06-02 NOTE — Progress Notes (Addendum)
PATIENT: Ashley Pacheco DOB: [birth year]  REASON FOR VISIT: follow up HISTORY FROM: patient  Chief Complaint  Patient presents with  . Follow-up    RM 6 here for a f/u on seizures. Pt complaines of being off balance.      HISTORY OF PRESENT ILLNESS: Today 06/02/20 Ashley Pacheco is a 74 y.o. female here today for follow up for seizures. She continues to do well on levetiracetam 500mg  BID. Last seizure in 2017. She is tolerating medication well.   She reports concerns of being off balance. She feels that this has gradually worsened. She fell last August walking up steps. She reports that she has fallen 2-3 times since. She gets dizzy with position changes. She is not sure this has been a contributor to falls. She has a significant heart history. She is now using a single prong cane. No recent falls. She has mentioned this to her PCP. She has not considered PT. She is using a "balance oil" that she feels may be helping.    HISTORY: (copied from my note on 05/30/2019)  Ashley Pacheco is a 74 y.o. female here today for follow up of seizure. She continues levetiracetam 500mg  twice daily. She denies seizure activity. Last seizure in 2017. She reports that headaches have resolved. She rarely has one. She is doing well and without complaints.   HISTORY: (copied from Dr Gladstone Lighter note on 05/24/2018)  UPDATE (05/24/18, VRP): Since last visit, doingwell.No seizures. However, now with new HA x 2-3 months, 2 per week, occipital. No other sxs. No similar HA in the past.Severityis mild. No alleviating or aggravating factors. Toleratinglevetiracetam.  UPDATE 04/01/17: Since last visit, was in [place] in April 2018, dx'd with afib and RVR, now on xarelto and diltiazem. Now doing well. No seizures. Tolerating LEV.   UPDATE 10/01/16: Since last visit, doing well. No sz. Tolerating LEV. No other abnl spells. She has some snoring, but no sig daytime fatigue. She is concerned about risk of stroke due  to her family history.   PRIOR HPI (08/13/16): 74 year old right-handed female here for evaluation of possible seizure. Patient has history of hypertension, hyperglycemia depression and heart flutter. 08/10/16, patient was at home playing solitaire on her tablet. She was sitting in a lounge chair where she sometimes takes a nap in the afternoon. Around 2 PM patient noticed that this screen on the tablet was "wavy". Patient looked away from the tablet and saw an afterimage of the tablet follow her visual tracking to the right side. Patient then tried to use the TV remote to turn off the television. She tried pressing a few buttons was unable to turn off the TV. The next thing she remembered she was waking up, noticed that the TV remote was on the floor and the television was still on. She then noticed that her tongue and throat were very sore. She got up and looked in the mirror she found that she had severely bitten her tongue on both sides. No incontinence. No definite postictal confusion. She did not feel sore in her arms or legs. In retrospect patient had a similar but minor event in spring of 2017 where she was using her tablet and saw a "wavy screen" lasting for approximately 1 minute. She did not have tongue biting or any other symptoms with that episode. Patient has normal worth and development mental history. No history of seizure in the past. No head trauma, meningitis or encephalitis in the past. No family history  of seizure. Patient lives alone, is fairly active, takes care of all ADLs. No recent change in medication, alcohol, sleep, stress or other events.    REVIEW OF SYSTEMS: Out of a complete 14 system review of symptoms, the patient complains only of the following symptoms, seizures, imbalance, falls and all other reviewed systems are negative.  ALLERGIES: Allergies  Allergen Reactions  . Adhesive [Tape] Rash    HOME MEDICATIONS: Outpatient Medications Prior to Visit  Medication Sig  Dispense Refill  . Cholecalciferol (VITAMIN D) 2000 UNITS tablet Take 2,000 Units by mouth daily.     [name] Cod Liver Oil 1000 MG CAPS Take by mouth.    . digoxin (LANOXIN) 0.125 MG tablet TAKE 1 TABLET BY MOUTH DAILY 90 tablet 3  . diphenhydrAMINE (BENADRYL ALLERGY) 25 MG tablet Take 25 mg by mouth as needed (allergies).     . Ferrous Gluconate (IRON 27 PO) Take by mouth daily.    . furosemide (LASIX) 40 MG tablet TAKE 1 TABLET BY MOUTH EVERY DAY 90 tablet 2  . HYDROcodone-acetaminophen (NORCO/VICODIN) 5-325 MG tablet Take 1 tablet by mouth every 6 (six) hours as needed for moderate pain.    [name] loperamide (IMODIUM) 2 MG capsule Take 2 mg by mouth as needed for diarrhea or loose stools.    . lovastatin (MEVACOR) 40 MG tablet Take 40 mg by mouth every morning.     . metoprolol succinate (TOPROL-XL) 50 MG 24 hr tablet TAKE 1 TABLET (50 MG TOTAL) BY MOUTH 2 (TWO) TIMES DAILY. TAKE WITH OR IMMEDIATELY FOLLOWING A MEAL. 180 tablet 3  . Multiple Vitamin (MULTI-VITAMIN DAILY PO) Take 1 tablet by mouth daily.     . naproxen sodium (ALEVE) 220 MG tablet Take 220 mg by mouth daily as needed (rarely as needed for knee pain).    [name] sertraline (ZOLOFT) 100 MG tablet Take 100 mg by mouth daily.    . traZODone (DESYREL) 150 MG tablet Take 150 mg by mouth at bedtime.     Alveda Reasons 20 MG TABS tablet TAKE 1 TABLET BY MOUTH DAILY WITH SUPPER 90 tablet 1  . levETIRAcetam (KEPPRA) 500 MG tablet Take 1 tablet (500 mg total) by mouth 2 (two) times daily. 180 tablet 4   No facility-administered medications prior to visit.    PAST MEDICAL HISTORY: Past Medical History:  Diagnosis Date  . Aortic stenosis 05/18/2017  . 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 Atrial fibrillation ([place])   . Atrial fibrillation with rapid ventricular response ([place]) 02/14/2017  . Atrial fibrillation with RVR ([place]) 11/15/2017  . Atypical chest pain    a. 2015 Neg MV.  . Cardiomegaly 2005  . Cardiomyopathy ([place])    a. 01/2017 Echo: EF 45%, diff HK, mild LVH,  triv AI, mild MR, mod dil LA, mildly dil RA, PASP 69mmHg - in setting of Afib.  [name] DVT (deep venous thrombosis) ([place]) 02/2004  . Fibroid   . Hypercholesteremia   . Hypertension   . Menometrorrhagia 3/92   menopausal  . Microhematuria 1/96  . Mitral regurgitation 05/18/2017  . Nephrolithiasis 2015  . PAF (paroxysmal atrial fibrillation) ([place])    a. 01/2017 TEE/DCCV: EF 45-50%, no LA/LAA thrombus-->successful DCCV @ 150 J. CHA2DS2VASc = 3-->Xarelto.  . Seizure disorder ([place]) 02/17/2017  . Seizures ([place])    followed by [facility]- Dr. Leta Baptist  . SUI (stress urinary incontinence, female) 9/91  . Vulvar cyst 7/90    PAST SURGICAL HISTORY: Past Surgical History:  Procedure Laterality  Date  . APPENDECTOMY  1950  . BUNIONECTOMY  7/11   second toe  . CARDIOVERSION N/A 02/17/2017   Procedure: CARDIOVERSION;  Surgeon: Skeet Latch, MD;  Location: [facility];  Service: Cardiovascular;  Laterality: N/A;  . cataracts  3/09 4/09  . DILATION AND CURETTAGE OF UTERUS     hystreroscopy/imcomplete polypectomy  . KNEE SURGERY  10/13 or 11/13   right knee arthroscopy  . TEE WITHOUT CARDIOVERSION N/A 02/17/2017   Procedure: TRANSESOPHAGEAL ECHOCARDIOGRAM (TEE);  Surgeon: Skeet Latch, MD;  Location: [facility];  Service: Cardiovascular;  Laterality: N/A;  . [place]  . TUBAL LIGATION  1975   BTL    FAMILY HISTORY: Family History  Problem Relation Age of Onset  . Cancer Father        stomach and prostate  . Kidney Stones Father   . Hypertension Mother   . Asthma Mother   . Kidney Stones Mother   . Stroke Maternal Uncle   . Heart attack Maternal Uncle   . Hypertension Maternal Uncle   . Cancer Paternal Uncle   . Stroke Maternal Grandfather   . Heart disease Sister        Rapid rhythm  . Skin cancer Sister   . Heart disease Brother        Rapid rhythm  . Skin cancer Brother     SOCIAL HISTORY: Social History   Socioeconomic History    . Marital status: Widowed    Spouse name: Not on file  . Number of children: 2  . Years of education: 64  . Highest education level: Not on file  Occupational History    Comment: volunteer  Tobacco Use  . Smoking status: Former Smoker    Types: Cigarettes  . Smokeless tobacco: Never Used  . Tobacco comment: quit in 1969  Vaping Use  . Vaping Use: Never used  Substance and Sexual Activity  . Alcohol use: Yes    Alcohol/week: 7.0 standard drinks    Types: 7 Glasses of wine per week  . Drug use: No  . Sexual activity: Not Currently    Partners: Male  Other Topics Concern  . Not on file  Social History Narrative   Lives alone   Caffeine- coffee- 3 mugs daily   Social Determinants of Health   Financial Resource Strain:   . Difficulty of Paying Living Expenses:   Food Insecurity:   . Worried About Charity fundraiser in the Last Year:   . Arboriculturist in the Last Year:   Transportation Needs:   . Film/video editor (Medical):   [name] Lack of Transportation (Non-Medical):   Physical Activity:   . Days of Exercise per Week:   . Minutes of Exercise per Session:   Stress:   . Feeling of Stress :   Social Connections:   . Frequency of Communication with Friends and Family:   . Frequency of Social Gatherings with Friends and Family:   . Attends Religious Services:   . Active Member of Clubs or Organizations:   . Attends Archivist Meetings:   [name] Marital Status:   Intimate Partner Violence:   . Fear of Current or Ex-Partner:   . Emotionally Abused:   [name] Physically Abused:   . Sexually Abused:       PHYSICAL EXAM  Vitals:   06/02/20 1017  BP: 138/90  Pulse: 90  Weight: 218 lb (98.9 kg)  Height: 5' 1.5" (1.562 m)  Body mass index is 40.52 kg/m.  Generalized: Well developed, in no acute distress  Cardiology: normal rate and rhythm, no murmur noted Respiratory: clear to auscultation bilaterally  Neurological examination  Mentation: Alert oriented  to time, place, history taking. Follows all commands speech and language fluent Cranial nerve II-XII: Pupils were equal round reactive to light. Extraocular movements were full, visual field were full on confrontational test. Facial sensation and strength were normal. Uvula tongue midline. Head turning and shoulder shrug  were normal and symmetric. Motor: The motor testing reveals 4+ over 5 strength of all 4 extremities. Good symmetric motor tone is noted throughout.  Sensory: Sensory testing is intact to soft touch on all 4 extremities. No evidence of extinction is noted.  Coordination: Cerebellar testing reveals good finger-nose-finger and heel-to-shin bilaterally.  Gait and station: Gait is short but stable with single prong cane. [place] is wearing short wedge type heel today. Tandem gait is unsteady. Romberg is negative. No drift is seen.  Reflexes: Deep tendon reflexes are symmetric and normal bilaterally.   DIAGNOSTIC DATA (LABS, IMAGING, TESTING) - I reviewed patient records, labs, notes, testing and imaging myself where available.  No flowsheet data found.   Lab Results  Component Value Date   WBC 7.9 04/19/2018   HGB 13.4 04/19/2018   HCT 40.4 04/19/2018   MCV 82 04/19/2018   PLT 194 04/19/2018      Component Value Date/Time   NA 144 04/19/2018 1034   K 4.0 04/19/2018 1034   CL 104 04/19/2018 1034   CO2 27 04/19/2018 1034   GLUCOSE 108 (H) 04/19/2018 1034   GLUCOSE 106 (H) 11/15/2017 1725   BUN 10 04/19/2018 1034   CREATININE 0.88 04/19/2018 1034   CALCIUM 8.8 04/19/2018 1034   PROT 6.0 04/19/2018 1034   ALBUMIN 3.9 04/19/2018 1034   AST 14 04/19/2018 1034   ALT 13 04/19/2018 1034   ALKPHOS 83 04/19/2018 1034   BILITOT 0.4 04/19/2018 1034   GFRNONAA 66 04/19/2018 1034   GFRAA 76 04/19/2018 1034   Lab Results  Component Value Date   CHOL 166 04/19/2018   HDL 35 (L) 04/19/2018   LDLCALC 103 (H) 04/19/2018   TRIG 140 04/19/2018   CHOLHDL 4.7 (H) 04/19/2018    Lab Results  Component Value Date   HGBA1C 5.5 11/15/2017   No results found for: [id number] Lab Results  Component Value Date   TSH 1.940 04/19/2018       ASSESSMENT AND PLAN 74 y.o. year old female  has a past medical history of Aortic stenosis (05/18/2017), Arthritis, Atrial fibrillation ([place]), Atrial fibrillation with rapid ventricular response ([place]) (02/14/2017), Atrial fibrillation with RVR ([place]) (11/15/2017), Atypical chest pain, Cardiomegaly (2005), Cardiomyopathy ([place]), DVT (deep venous thrombosis) ([place]) (02/2004), Fibroid, Hypercholesteremia, Hypertension, Menometrorrhagia (3/92), Microhematuria (1/96), Mitral regurgitation (05/18/2017), Nephrolithiasis (2015), PAF (paroxysmal atrial fibrillation) ([place]), Seizure disorder ([place]) (02/17/2017), Seizures ([place]), SUI (stress urinary incontinence, female) (9/91), and Vulvar cyst (7/90). here with     ICD-10-CM   1. Seizure disorder ([place])  G40.909   2. Imbalance  R26.89   3. Multiple falls  R29.6     Ashley Pacheco is doing well from a seizure standpoint. We will continue levetiracetam 500mg  twice daily. We have discussed her concerns of falls. I have advised that she continue discussion with PCP and consider PT if concerns continue. She was encouraged to continue using her cane and fall precautions advised. Healthy lifestyle habits encouraged. She will follow up in 1 year, sooner if needed.  She verbalizes understanding and agreement with this plan.    No orders of the defined types were placed in this encounter.    Meds ordered this encounter  Medications  . levETIRAcetam (KEPPRA) 500 MG tablet    Sig: Take 1 tablet (500 mg total) by mouth 2 (two) times daily.    Dispense:  180 tablet    Refill:  4    Order Specific Question:   Supervising Provider    Answer:   Melvenia Beam [id number]      I spent 30 minutes with the patient. 50% of this time was spent counseling and educating patient on plan of care and medications.    Debbora Presto,  FNP-C 06/02/2020, 1:59 PM [facility] [address], [place],  [address] [phone]

## 2020-06-18 NOTE — Progress Notes (Signed)
I reviewed note and agree with plan.   Penni Bombard, MD 7/51/0258, 52:77 AM Certified in Neurology, Neurophysiology and Neuroimaging  [facility] [address], [place],  [address] [phone]

## 2020-07-10 ENCOUNTER — Other Ambulatory Visit: Payer: Self-pay | Admitting: Nurse Practitioner

## 2020-07-15 ENCOUNTER — Other Ambulatory Visit: Payer: Self-pay | Admitting: Cardiology

## 2020-07-15 NOTE — Telephone Encounter (Signed)
Xarelto 20mg  refill request received. Pt is 74 years old, weight-89.9kg, Crea-0.81 on 04/03/2020 via scanned labs from [employer], last seen by Dr. Marlou Porch on 10/02/2019, Diagnosis-Afib, CrCl-93.60ml/min; Dose is appropriate based on dosing criteria. Will send in refill to requested pharmacy.

## 2020-07-21 DIAGNOSIS — Z961 Presence of intraocular lens: Secondary | ICD-10-CM | POA: Diagnosis not present

## 2020-08-12 DIAGNOSIS — Z23 Encounter for immunization: Secondary | ICD-10-CM | POA: Diagnosis not present

## 2020-09-15 ENCOUNTER — Other Ambulatory Visit: Payer: Self-pay | Admitting: Nurse Practitioner

## 2020-09-15 ENCOUNTER — Other Ambulatory Visit: Payer: Self-pay | Admitting: Cardiology

## 2020-09-16 ENCOUNTER — Telehealth: Payer: Self-pay | Admitting: Family Medicine

## 2020-09-16 NOTE — Telephone Encounter (Signed)
Pt called needing a refill on her levETIRAcetam (KEPPRA) 500 MG tablet sent in to the [facility] on [address].

## 2020-09-16 NOTE — Telephone Encounter (Signed)
LMVM for pt that escript was sent 06-02-20 for 180 tabs and 4 refills at [facility].  Please call the pharmacy and relay that information.  If problem pharmacy to let us know.

## 2020-10-08 ENCOUNTER — Other Ambulatory Visit: Payer: Self-pay | Admitting: Nurse Practitioner

## 2020-10-13 ENCOUNTER — Encounter: Payer: Self-pay | Admitting: Cardiology

## 2020-10-13 ENCOUNTER — Ambulatory Visit (INDEPENDENT_AMBULATORY_CARE_PROVIDER_SITE_OTHER): Payer: Medicare Other | Admitting: Cardiology

## 2020-10-13 ENCOUNTER — Other Ambulatory Visit: Payer: Self-pay

## 2020-10-13 VITALS — BP 140/90 | HR 87 | Ht 61.5 in | Wt 218.0 lb

## 2020-10-13 DIAGNOSIS — I4821 Permanent atrial fibrillation: Secondary | ICD-10-CM | POA: Diagnosis not present

## 2020-10-13 DIAGNOSIS — I4811 Longstanding persistent atrial fibrillation: Secondary | ICD-10-CM

## 2020-10-13 DIAGNOSIS — G40909 Epilepsy, unspecified, not intractable, without status epilepticus: Secondary | ICD-10-CM | POA: Diagnosis not present

## 2020-10-13 NOTE — Patient Instructions (Signed)
Medication Instructions:  The current medical regimen is effective;  continue present plan and medications.  *If you need a refill on your cardiac medications before your next appointment, please call your pharmacy*  Follow-Up: At [facility], you and your health needs are our priority.  As part of our continuing mission to provide you with exceptional heart care, we have created designated Provider Care Teams.  These Care Teams include your primary Cardiologist (physician) and Advanced Practice Providers (APPs -  Physician Assistants and Nurse Practitioners) who all work together to provide you with the care you need, when you need it.  We recommend signing up for the patient portal called "MyChart".  Sign up information is provided on this After Visit Summary.  MyChart is used to connect with patients for Virtual Visits (Telemedicine).  Patients are able to view lab/test results, encounter notes, upcoming appointments, etc.  Non-urgent messages can be sent to your provider as well.   To learn more about what you can do with MyChart, go to [email or web address].    Your next appointment:   12 month(s)  The format for your next appointment:   In Person  Provider:   Mark Skains, MD   Thank you for choosing [facility]!!      

## 2020-10-13 NOTE — Progress Notes (Signed)
Cardiology Office Note:    Date:  10/13/2020   ID:  Ashley Pacheco, DOB [birth year], MRN [id number]  PCP:  Leighton Ruff, MD  [facility] Cardiologist:  Candee Furbish, MD  [facility] Electrophysiologist:  None   Referring MD: Leighton Ruff, MD     History of Present Illness:    Ashley Pacheco is a 74 y.o. female here for the follow-up of paroxysmal atrial fibrillation.  Back in 2019 found to be in atrial fibrillation with rate control EF 40 to 45%.  After adequate rate control, repeat cardioversion was not recommended since she was symptom-free.  Nuclear stress test back in 2019 showed no ischemia EF was 40 to 45% on echo but nuclear 34%.  Previously quite fatigued.  At last visit last year, rate was usually around 100.  She was on digoxin and metoprolol.  She had a couple falls, balance issues.  Past Medical History:  Diagnosis Date  . Aortic stenosis 05/18/2017  . 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 Atrial fibrillation ([place])   . Atrial fibrillation with rapid ventricular response ([place]) 02/14/2017  . Atrial fibrillation with RVR ([place]) 11/15/2017  . Atypical chest pain    a. 2015 Neg MV.  . Cardiomegaly 2005  . Cardiomyopathy ([place])    a. 01/2017 Echo: EF 45%, diff HK, mild LVH, triv AI, mild MR, mod dil LA, mildly dil RA, PASP 63mmHg - in setting of Afib.  [name] DVT (deep venous thrombosis) ([place]) 02/2004  . Fibroid   . Hypercholesteremia   . Hypertension   . Menometrorrhagia 3/92   menopausal  . Microhematuria 1/96  . Mitral regurgitation 05/18/2017  . Nephrolithiasis 2015  . PAF (paroxysmal atrial fibrillation) ([place])    a. 01/2017 TEE/DCCV: EF 45-50%, no LA/LAA thrombus-->successful DCCV @ 150 J. CHA2DS2VASc = 3-->Xarelto.  . Seizure disorder ([place]) 02/17/2017  . Seizures ([place])    followed by [facility]- Dr. Leta Baptist  . SUI (stress urinary incontinence, female) 9/91  . Vulvar cyst 7/90    Past Surgical History:  Procedure Laterality Date  . APPENDECTOMY   1950  . BUNIONECTOMY  7/11   second toe  . CARDIOVERSION N/A 02/17/2017   Procedure: CARDIOVERSION;  Surgeon: Skeet Latch, MD;  Location: [place];  Service: Cardiovascular;  Laterality: N/A;  . cataracts  3/09 4/09  . DILATION AND CURETTAGE OF UTERUS     hystreroscopy/imcomplete polypectomy  . KNEE SURGERY  10/13 or 11/13   right knee arthroscopy  . TEE WITHOUT CARDIOVERSION N/A 02/17/2017   Procedure: TRANSESOPHAGEAL ECHOCARDIOGRAM (TEE);  Surgeon: Skeet Latch, MD;  Location: [place];  Service: Cardiovascular;  Laterality: N/A;  . [place]  . TUBAL LIGATION  1975   BTL    Current Medications: Current Meds  Medication Sig  . Cholecalciferol (VITAMIN D) 2000 UNITS tablet Take 2,000 Units by mouth daily.   [name] Cod Liver Oil 1000 MG CAPS Take by mouth.  . digoxin (LANOXIN) 0.125 MG tablet TAKE 1 TABLET BY MOUTH DAILY  . diphenhydrAMINE (BENADRYL) 25 MG tablet Take 25 mg by mouth as needed (allergies).   . Ferrous Gluconate (IRON 27 PO) Take by mouth daily.  . furosemide (LASIX) 40 MG tablet TAKE 1 TABLET BY MOUTH EVERY DAY  . HYDROcodone-acetaminophen (NORCO/VICODIN) 5-325 MG tablet Take 1 tablet by mouth every 6 (six) hours as needed for moderate pain.  [name] levETIRAcetam (KEPPRA) 500 MG tablet Take 1 tablet (500 mg total) by mouth 2 (two) times daily.  [name]  loperamide (IMODIUM) 2 MG capsule Take 2 mg by mouth as needed for diarrhea or loose stools.  . lovastatin (MEVACOR) 40 MG tablet Take 40 mg by mouth every morning.   . metoprolol succinate (TOPROL-XL) 50 MG 24 hr tablet Take 1 tablet by mouth in the morning and at bedtime. Please keep upcoming appt in December with Dr. Marlou Porch before anymore refills. Thank you  . Multiple Vitamin (MULTI-VITAMIN DAILY PO) Take 1 tablet by mouth daily.   . naproxen sodium (ALEVE) 220 MG tablet Take 220 mg by mouth daily as needed (rarely as needed for knee pain).  [name] sertraline (ZOLOFT) 100 MG tablet Take 100 mg  by mouth daily.  . traZODone (DESYREL) 150 MG tablet Take 150 mg by mouth at bedtime.   Alveda Reasons 20 MG TABS tablet TAKE 1 TABLET BY MOUTH DAILY WITH SUPPER     Allergies:   Adhesive [tape]   Social History   Socioeconomic History  . Marital status: Widowed    Spouse name: Not on file  . Number of children: 2  . Years of education: 66  . Highest education level: Not on file  Occupational History    Comment: volunteer  Tobacco Use  . Smoking status: Former Smoker    Types: Cigarettes  . Smokeless tobacco: Never Used  . Tobacco comment: quit in 1969  Vaping Use  . Vaping Use: Never used  Substance and Sexual Activity  . Alcohol use: Yes    Alcohol/week: 7.0 standard drinks    Types: 7 Glasses of wine per week  . Drug use: No  . Sexual activity: Not Currently    Partners: Male  Other Topics Concern  . Not on file  Social History Narrative   Lives alone   Caffeine- coffee- 3 mugs daily   Social Determinants of Health   Financial Resource Strain: Not on file  Food Insecurity: Not on file  Transportation Needs: Not on file  Physical Activity: Not on file  Stress: Not on file  Social Connections: Not on file     Family History: The patient's family history includes Asthma in her mother; Cancer in her father and paternal uncle; Heart attack in her maternal uncle; Heart disease in her brother and sister; Hypertension in her maternal uncle and mother; Kidney Stones in her father and mother; Skin cancer in her brother and sister; Stroke in her maternal grandfather and maternal uncle.  ROS:   Please see the history of present illness.     All other systems reviewed and are negative.  EKGs/Labs/Other Studies Reviewed:     EKG:  EKG is  ordered today.  The ekg ordered today demonstrates atrial fibrillation 87 no other abnormalities.  Recent Labs: No results found for requested labs within last 8760 hours.  Recent Lipid Panel    Component Value Date/Time   CHOL 166  04/19/2018 1034   TRIG 140 04/19/2018 1034   HDL 35 (L) 04/19/2018 1034   CHOLHDL 4.7 (H) 04/19/2018 1034   LDLCALC 103 (H) 04/19/2018 1034     Physical Exam:    VS:  BP 140/90 (BP Location: Left Arm, Patient Position: Sitting, Cuff Size: Normal)   Pulse 87   Ht 5' 1.5" (1.562 m)   Wt 218 lb (98.9 kg)   LMP 10/26/1999   SpO2 97%   BMI 40.52 kg/m     Wt Readings from Last 3 Encounters:  10/13/20 218 lb (98.9 kg)  06/02/20 218 lb (98.9 kg)  10/02/19 220  lb 12.8 oz (100.2 kg)     GEN:  Well nourished, well developed in no acute distress HEENT: Normal NECK: No JVD; No carotid bruits LYMPHATICS: No lymphadenopathy CARDIAC: irreg, no murmurs, rubs, gallops RESPIRATORY:  Clear to auscultation without rales, wheezing or rhonchi  ABDOMEN: Soft, non-tender, non-distended MUSCULOSKELETAL:  No edema; No deformity  SKIN: Warm and dry NEUROLOGIC:  Alert and oriented x 3 PSYCHIATRIC:  Normal affect   ASSESSMENT:    1. Permanent atrial fibrillation ([place])   2. Morbid obesity ([place])   3. Seizure disorder ([place])   4. Longstanding persistent atrial fibrillation (HCC)    PLAN:    In order of problems listed above:  Permanent atrial fibrillation -Rate control has been challenging.  Has discussed with EP before.  Holding off on AV nodal ablation.  Digoxin, metoprolol.  No changes made.  Chronic anticoagulation -On Xarelto 20 mg.  Doing well.  No bleeding.  She has had a few falls.  Trying to be careful.  Disequilibrium.  Hemoglobin 13.7 creatinine 0.8, TSH 2.4  Dyspnea on exertion -Multifactorial.  Encouraged daily exercise.  She does have a set of paddles that she can put below her when she is seated like a bicycle.  Did encourage continued use of this.  She and her family have noticed that she has had increased dyspnea on exertion.  Her bedroom is on the second floor.  Harder and harder for her to do this.  Continue to work on weight loss as well.  Morbid obesity -Continue to  encourage weight loss.  Seizure disorder -Continuing with Keppra.  Dr. Leta Baptist.  She states the most inconvenient thing about this was that she could not drive for 6 months.  Mild memory impairment -Has noted some trouble with word finding difficulty.  Disequilibrium -Being careful with cane.  Still has sustained a few falls. Medication Adjustments/Labs and Tests Ordered: Current medicines are reviewed at length with the patient today.  Concerns regarding medicines are outlined above.  Orders Placed This Encounter  Procedures  . EKG 12-Lead   No orders of the defined types were placed in this encounter.   Patient Instructions  Medication Instructions:  The current medical regimen is effective;  continue present plan and medications.  *If you need a refill on your cardiac medications before your next appointment, please call your pharmacy*  Follow-Up: At [facility], you and your health needs are our priority.  As part of our continuing mission to provide you with exceptional heart care, we have created designated Provider Care Teams.  These Care Teams include your primary Cardiologist (physician) and Advanced Practice Providers (APPs -  Physician Assistants and Nurse Practitioners) who all work together to provide you with the care you need, when you need it.  We recommend signing up for the patient portal called "MyChart".  Sign up information is provided on this After Visit Summary.  MyChart is used to connect with patients for Virtual Visits (Telemedicine).  Patients are able to view lab/test results, encounter notes, upcoming appointments, etc.  Non-urgent messages can be sent to your provider as well.   To learn more about what you can do with MyChart, go to [email or web address].    Your next appointment:   12 month(s)  The format for your next appointment:   In Person  Provider:   Candee Furbish, MD  Thank you for choosing [facility]!!          Signed, Candee Furbish, MD  10/13/2020 11:11 AM  [facility]

## 2020-11-12 ENCOUNTER — Other Ambulatory Visit: Payer: Self-pay | Admitting: Cardiology

## 2020-12-01 ENCOUNTER — Other Ambulatory Visit: Payer: Self-pay | Admitting: Cardiology

## 2020-12-01 ENCOUNTER — Other Ambulatory Visit: Payer: Self-pay | Admitting: Nurse Practitioner

## 2020-12-03 NOTE — Telephone Encounter (Signed)
Prescription refill request for Xarelto received.   Indication: Afib Last office visit: [place],10/13/2020 Weight: 98.9 kg  Age: 75 yo  Scr: 0.81, 04/03/2020 CrCl: 93 ml/min   Prescription refill sent.

## 2020-12-31 DIAGNOSIS — Z6841 Body Mass Index (BMI) 40.0 and over, adult: Secondary | ICD-10-CM | POA: Diagnosis not present

## 2020-12-31 DIAGNOSIS — M8588 Other specified disorders of bone density and structure, other site: Secondary | ICD-10-CM | POA: Diagnosis not present

## 2020-12-31 DIAGNOSIS — E7800 Pure hypercholesterolemia, unspecified: Secondary | ICD-10-CM | POA: Diagnosis not present

## 2020-12-31 DIAGNOSIS — I10 Essential (primary) hypertension: Secondary | ICD-10-CM | POA: Diagnosis not present

## 2020-12-31 DIAGNOSIS — G479 Sleep disorder, unspecified: Secondary | ICD-10-CM | POA: Diagnosis not present

## 2020-12-31 DIAGNOSIS — Z1211 Encounter for screening for malignant neoplasm of colon: Secondary | ICD-10-CM | POA: Diagnosis not present

## 2020-12-31 DIAGNOSIS — R5381 Other malaise: Secondary | ICD-10-CM | POA: Diagnosis not present

## 2020-12-31 DIAGNOSIS — G40909 Epilepsy, unspecified, not intractable, without status epilepticus: Secondary | ICD-10-CM | POA: Diagnosis not present

## 2020-12-31 DIAGNOSIS — I4821 Permanent atrial fibrillation: Secondary | ICD-10-CM | POA: Diagnosis not present

## 2020-12-31 DIAGNOSIS — I5022 Chronic systolic (congestive) heart failure: Secondary | ICD-10-CM | POA: Diagnosis not present

## 2020-12-31 DIAGNOSIS — Z7901 Long term (current) use of anticoagulants: Secondary | ICD-10-CM | POA: Diagnosis not present

## 2020-12-31 DIAGNOSIS — F3341 Major depressive disorder, recurrent, in partial remission: Secondary | ICD-10-CM | POA: Diagnosis not present

## 2021-01-02 ENCOUNTER — Other Ambulatory Visit: Payer: Self-pay | Admitting: Family Medicine

## 2021-01-02 DIAGNOSIS — M8580 Other specified disorders of bone density and structure, unspecified site: Secondary | ICD-10-CM

## 2021-01-02 DIAGNOSIS — Z1231 Encounter for screening mammogram for malignant neoplasm of breast: Secondary | ICD-10-CM

## 2021-05-19 DIAGNOSIS — Z23 Encounter for immunization: Secondary | ICD-10-CM | POA: Diagnosis not present

## 2021-06-03 ENCOUNTER — Encounter: Payer: Self-pay | Admitting: Family Medicine

## 2021-06-03 ENCOUNTER — Ambulatory Visit (INDEPENDENT_AMBULATORY_CARE_PROVIDER_SITE_OTHER): Payer: Medicare Other | Admitting: Family Medicine

## 2021-06-03 ENCOUNTER — Other Ambulatory Visit: Payer: Self-pay

## 2021-06-03 VITALS — BP 116/82 | HR 114 | Ht 61.5 in | Wt 218.2 lb

## 2021-06-03 DIAGNOSIS — G40909 Epilepsy, unspecified, not intractable, without status epilepticus: Secondary | ICD-10-CM | POA: Diagnosis not present

## 2021-06-03 MED ORDER — LEVETIRACETAM 500 MG PO TABS: 500.0000 mg | ORAL_TABLET | Freq: Two times a day (BID) | ORAL | 4 refills | Status: DC

## 2021-06-03 NOTE — Progress Notes (Signed)
PATIENT: Ashley Pacheco DOB: [birth year]  REASON FOR VISIT: follow up HISTORY FROM: patient  Chief Complaint  Patient presents with   Follow-up    New rm, alone. Here for yearly SZ f/u. Pt reports doing well and no sz like activity. Pt states being light headed first thing in the morning and has to sit in bed for a few seconds. Pt states it might be related to lack of balance. Has been using her cane at all times since. Started a few months ago.      HISTORY OF PRESENT ILLNESS: 06/03/2021 ALL:  Ashley Pacheco returns for follow up for seizures. She continues levetiracetam '500mg'$  BID. She denies recent seizure activity. She continues to have trouble with feeling lightheaded when she sits up in bed. She has had a couple of falls but was not dizzy or feeling lightheaded. She feels falls are when she doesn't have her cane. She is trying to use it all the time.   06/02/2020 ALL:  Ashley Pacheco is a 75 y.o. female here today for follow up for seizures. She continues to do well on levetiracetam '500mg'$  BID. Last seizure in 2017. She is tolerating medication well.   She reports concerns of being off balance. She feels that this has gradually worsened. She fell last August walking up steps. She reports that she has fallen 2-3 times since. She gets dizzy with position changes. She is not sure this has been a contributor to falls. She has a significant heart history. She is now using a single prong cane. No recent falls. She has mentioned this to her PCP. She has not considered PT. She is using a "balance oil" that she feels may be helping.   HISTORY: (copied from my note on 05/30/2019)  Ashley Pacheco is a 75 y.o. female here today for follow up of seizure. She continues levetiracetam '500mg'$  twice daily. She denies seizure activity. Last seizure in 2017. She reports that headaches have resolved. She rarely has one. She is doing well and without complaints.    HISTORY: (copied from Dr Gladstone Lighter note on  05/24/2018)   UPDATE (05/24/18, VRP): Since last visit, doing well. No seizures. However, now with new HA x 2-3 months, 2 per week, occipital. No other sxs. No similar HA in the past. Severity is mild. No alleviating or aggravating factors. Tolerating levetiracetam.     UPDATE 04/01/17: Since last visit, was in [place] in April 2018, dx'd with afib and RVR, now on xarelto and diltiazem. Now doing well. No seizures. Tolerating LEV.    UPDATE 10/01/16: Since last visit, doing well. No sz. Tolerating LEV. No other abnl spells. She has some snoring, but no sig daytime fatigue. She is concerned about risk of stroke due to her family history.    PRIOR HPI (08/13/16): 75 year old right-handed female here for evaluation of possible seizure. Patient has history of hypertension, hyperglycemia depression and heart flutter. 08/10/16, patient was at home playing solitaire on her tablet. She was sitting in a lounge chair where she sometimes takes a nap in the afternoon. Around 2 PM patient noticed that this screen on the tablet was "wavy". Patient looked away from the tablet and saw an afterimage of the tablet follow her visual tracking to the right side. Patient then tried to use the TV remote to turn off the television. She tried pressing a few buttons was unable to turn off the TV. The next thing she remembered she was waking up, noticed that the  TV remote was on the floor and the television was still on. She then noticed that her tongue and throat were very sore. She got up and looked in the mirror she found that she had severely bitten her tongue on both sides. No incontinence. No definite postictal confusion. She did not feel sore in her arms or legs. In retrospect patient had a similar but minor event in spring of 2017 where she was using her tablet and saw a "wavy screen" lasting for approximately 1 minute. She did not have tongue biting or any other symptoms with that episode. Patient has normal worth and development  mental history. No history of seizure in the past. No head trauma, meningitis or encephalitis in the past. No family history of seizure. Patient lives alone, is fairly active, takes care of all ADLs. No recent change in medication, alcohol, sleep, stress or other events.     REVIEW OF SYSTEMS: Out of a complete 14 system review of symptoms, the patient complains only of the following symptoms, seizures, imbalance, falls and all other reviewed systems are negative.  ALLERGIES: Allergies  Allergen Reactions   Adhesive [Tape] Rash    HOME MEDICATIONS: Outpatient Medications Prior to Visit  Medication Sig Dispense Refill   Cholecalciferol (VITAMIN D) 2000 UNITS tablet Take 2,000 Units by mouth daily.      Cod Liver Oil 1000 MG CAPS Take by mouth.     digoxin (LANOXIN) 0.125 MG tablet TAKE 1 TABLET BY MOUTH DAILY 90 tablet 3   diphenhydrAMINE (BENADRYL) 25 MG tablet Take 25 mg by mouth as needed (allergies).      Ferrous Gluconate (IRON 27 PO) Take by mouth daily.     furosemide (LASIX) 40 MG tablet TAKE 1 TABLET BY MOUTH EVERY DAY 90 tablet 2   HYDROcodone-acetaminophen (NORCO/VICODIN) 5-325 MG tablet Take 1 tablet by mouth every 6 (six) hours as needed for moderate pain.     loperamide (IMODIUM) 2 MG capsule Take 2 mg by mouth as needed for diarrhea or loose stools.     lovastatin (MEVACOR) 40 MG tablet Take 40 mg by mouth every morning.      metoprolol succinate (TOPROL-XL) 50 MG 24 hr tablet TAKE 1 TABLET BY MOUTH IN THE MORNING AND AT BEDTIME. PLEASE KEEP UPCOMING APPT IN DECEMBER 180 tablet 2   Multiple Vitamin (MULTI-VITAMIN DAILY PO) Take 1 tablet by mouth daily.      naproxen sodium (ALEVE) 220 MG tablet Take 220 mg by mouth daily as needed (rarely as needed for knee pain).     sertraline (ZOLOFT) 100 MG tablet Take 100 mg by mouth daily.     traZODone (DESYREL) 150 MG tablet Take 150 mg by mouth at bedtime.      XARELTO 20 MG TABS tablet TAKE 1 TABLET BY MOUTH DAILY WITH SUPPER 90  tablet 1   levETIRAcetam (KEPPRA) 500 MG tablet Take 1 tablet (500 mg total) by mouth 2 (two) times daily. 180 tablet 4   No facility-administered medications prior to visit.    PAST MEDICAL HISTORY: Past Medical History:  Diagnosis Date   Aortic stenosis 05/18/2017   Arthritis    in knee   Atrial fibrillation (HCC)    Atrial fibrillation with rapid ventricular response ([place]) 02/14/2017   Atrial fibrillation with RVR ([place]) 11/15/2017   Atypical chest pain    a. 2015 Neg MV.   Cardiomegaly 2005   Cardiomyopathy [facility])    a. 01/2017 Echo: EF 45%, diff HK, mild LVH,  triv AI, mild MR, mod dil LA, mildly dil RA, PASP 25mHg - in setting of Afib.   DVT (deep venous thrombosis) ([place] 02/2004   Fibroid    Hypercholesteremia    Hypertension    Menometrorrhagia 3/92   menopausal   Microhematuria 1/96   Mitral regurgitation 05/18/2017   Nephrolithiasis 2015   PAF (paroxysmal atrial fibrillation) ([place]    a. 01/2017 TEE/DCCV: EF 45-50%, no LA/LAA thrombus-->successful DCCV @ 150 J. CHA2DS2VASc = 3-->Xarelto.   Seizure disorder ([place] 02/17/2017   Seizures ([place]    [facility]  SUI (stress urinary incontinence, female) 9/91   Vulvar cyst 7/90    PAST SURGICAL HISTORY: Past Surgical History:  Procedure Laterality Date   APPENDECTOMY  1950   BUNIONECTOMY  7/11   second toe   CARDIOVERSION N/A 02/17/2017   Procedure: CARDIOVERSION;  Surgeon: TSkeet Latch MD;  [place]  Service: Cardiovascular;  Laterality: N/A;   cataracts  3/09 4/09   DILATION AND CURETTAGE OF UTERUS     hystreroscopy/imcomplete polypectomy   KNEE SURGERY  10/13 or 11/13   right knee arthroscopy   TEE WITHOUT CARDIOVERSION N/A 02/17/2017   Procedure: TRANSESOPHAGEAL ECHOCARDIOGRAM (TEE);  Surgeon: TSkeet Latch MD;  [facility];  Service: Cardiovascular;  Laterality: N/A;   [place]  [place]     FAMILY HISTORY: Family History  Problem Relation Age of Onset   Cancer Father        stomach and prostate   Kidney Stones Father    Hypertension Mother    Asthma Mother    Kidney Stones Mother    Stroke Maternal Uncle    Heart attack Maternal Uncle    Hypertension Maternal Uncle    Cancer Paternal Uncle    Stroke Maternal Grandfather    Heart disease Sister        Rapid rhythm   Skin cancer Sister    Heart disease Brother        Rapid rhythm   Skin cancer Brother     SOCIAL HISTORY: Social History   Socioeconomic History   Marital status: Widowed    Spouse name: Not on file   Number of children: 2   Years of education: 159  Highest education level: Not on file  Occupational History    Comment: volunteer  Tobacco Use   Smoking status: Former    Types: Cigarettes   Smokeless tobacco: Never   Tobacco comments:    quit in 1969  Vaping Use   Vaping Use: Never used  Substance and Sexual Activity   Alcohol use: Yes    Alcohol/week: 7.0 standard drinks    Types: 7 Glasses of wine per week   Drug use: No   Sexual activity: Not Currently    Partners: Male  Other Topics Concern   Not on file  Social History Narrative   Lives alone   Caffeine- coffee- 3 mugs daily   Social Determinants of Health   Financial Resource Strain: Not on file  Food Insecurity: Not on file  Transportation Needs: Not on file  Physical Activity: Not on file  Stress: Not on file  Social Connections: Not on file  Intimate Partner Violence: Not on file      PHYSICAL EXAM  Vitals:   06/03/21 0954  BP: 116/82  Pulse: (!) 114  SpO2: 95%  Weight: 218 lb 3.2 oz (99 kg)  Height: 5' 1.5" (1.562 m)    Body mass index is 40.56 kg/m.  Generalized: Well developed, in no acute distress  Cardiology: normal rate and rhythm, no murmur noted Respiratory: clear to auscultation bilaterally  Neurological examination  Mentation: Alert oriented to time, place, history taking. Follows all  commands speech and language fluent Cranial nerve II-XII: Pupils were equal round reactive to light. Extraocular movements were full, visual field were full on confrontational test. Facial sensation and strength were normal. Head turning and shoulder shrug  were normal and symmetric. Motor: The motor testing reveals 4+ over 5 strength of all 4 extremities. Good symmetric motor tone is noted throughout.  Sensory: Sensory testing is intact to soft touch on all 4 extremities. No evidence of extinction is noted.  Coordination: Cerebellar testing reveals good finger-nose-finger and heel-to-shin bilaterally.  Gait and station: Gait is short but stable with single prong cane. She does not pick up her feet. Patient is wearing flip flops today. Tandem gait is unsteady. Romberg is negative. No drift is seen.  Reflexes: Deep tendon reflexes are symmetric and normal bilaterally.   DIAGNOSTIC DATA (LABS, IMAGING, TESTING) - I reviewed patient records, labs, notes, testing and imaging myself where available.  No flowsheet data found.   Lab Results  Component Value Date   WBC 7.9 04/19/2018   HGB 13.4 04/19/2018   HCT 40.4 04/19/2018   MCV 82 04/19/2018   PLT 194 04/19/2018      Component Value Date/Time   NA 144 04/19/2018 1034   K 4.0 04/19/2018 1034   CL 104 04/19/2018 1034   CO2 27 04/19/2018 1034   GLUCOSE 108 (H) 04/19/2018 1034   GLUCOSE 106 (H) 11/15/2017 1725   BUN 10 04/19/2018 1034   CREATININE 0.88 04/19/2018 1034   CALCIUM 8.8 04/19/2018 1034   PROT 6.0 04/19/2018 1034   ALBUMIN 3.9 04/19/2018 1034   AST 14 04/19/2018 1034   ALT 13 04/19/2018 1034   ALKPHOS 83 04/19/2018 1034   BILITOT 0.4 04/19/2018 1034   GFRNONAA 66 04/19/2018 1034   GFRAA 76 04/19/2018 1034   Lab Results  Component Value Date   CHOL 166 04/19/2018   HDL 35 (L) 04/19/2018   LDLCALC 103 (H) 04/19/2018   TRIG 140 04/19/2018   CHOLHDL 4.7 (H) 04/19/2018   Lab Results  Component Value Date   HGBA1C 5.5  11/15/2017   No results found for: [id number] Lab Results  Component Value Date   TSH 1.940 04/19/2018       ASSESSMENT AND PLAN 75 y.o. year old female  has a past medical history of Aortic stenosis (05/18/2017), Arthritis, Atrial fibrillation ([place]), Atrial fibrillation with rapid ventricular response ([place]) (02/14/2017), Atrial fibrillation with RVR ([place]) (11/15/2017), Atypical chest pain, Cardiomegaly (2005), Cardiomyopathy ([place]), DVT (deep venous thrombosis) ([place]) (02/2004), Fibroid, Hypercholesteremia, Hypertension, Menometrorrhagia (3/92), Microhematuria (1/96), Mitral regurgitation (05/18/2017), Nephrolithiasis (2015), PAF (paroxysmal atrial fibrillation) ([place]), Seizure disorder ([place]) (02/17/2017), Seizures ([place]), SUI (stress urinary incontinence, female) (9/91), and Vulvar cyst (7/90). here with     ICD-10-CM   1. Seizure disorder ([place])  G40.909        M[place]ley is doing well from a seizure standpoint. We will continue levetiracetam '500mg'$  twice daily. We have discussed her concerns of falls. I have advised that she continue discussion with PCP and consider PT if concerns continue. Supportive shoes recommended. She was encouraged to continue using her cane and fall precautions advised. Healthy lifestyle habits encouraged. She will follow up in 1 year,  sooner if needed. She verbalizes understanding and agreement with this plan.    No orders of the defined types were placed in this encounter.    Meds ordered this encounter  Medications   levETIRAcetam (KEPPRA) 500 MG tablet    Sig: Take 1 tablet (500 mg total) by mouth 2 (two) times daily.    Dispense:  180 tablet    Refill:  4    Order Specific Question:   Supervising Provider    Answer:   Bess Harvest, FNP-C 06/03/2021, 10:33 AM [facility] [address] [place], [place] [address] [phone]

## 2021-06-03 NOTE — Patient Instructions (Signed)
Below is our plan:  We will continue levetiracetam '500mg'$  twice daily. Please continue to discuss concerns of feeling lightheaded when you sit up from bed with your primary care provider. This can be related to your blood pressure and atrial fibrillation. Please let me know if you need me.   Please make sure you are staying well hydrated. I recommend 50-60 ounces daily. Well balanced diet and regular exercise encouraged. Consistent sleep schedule with 6-8 hours recommended.   Please continue follow up with care team as directed.   Follow up with me in 1 year   You may receive a survey regarding today's visit. I encourage you to leave honest feed back as I do use this information to improve patient care. Thank you for seeing me today!

## 2021-07-01 ENCOUNTER — Other Ambulatory Visit: Payer: Self-pay

## 2021-07-01 ENCOUNTER — Ambulatory Visit
Admission: RE | Admit: 2021-07-01 | Discharge: 2021-07-01 | Disposition: A | Discharge status: Home / Self Care | Payer: Medicare Other | Source: Ambulatory Visit | Attending: Family Medicine | Admitting: Family Medicine

## 2021-07-01 DIAGNOSIS — Z780 Asymptomatic menopausal state: Secondary | ICD-10-CM | POA: Diagnosis not present

## 2021-07-01 DIAGNOSIS — M85851 Other specified disorders of bone density and structure, right thigh: Secondary | ICD-10-CM | POA: Diagnosis not present

## 2021-07-01 DIAGNOSIS — Z1231 Encounter for screening mammogram for malignant neoplasm of breast: Secondary | ICD-10-CM | POA: Diagnosis not present

## 2021-07-01 DIAGNOSIS — M8580 Other specified disorders of bone density and structure, unspecified site: Secondary | ICD-10-CM

## 2021-07-15 DIAGNOSIS — I4819 Other persistent atrial fibrillation: Secondary | ICD-10-CM | POA: Diagnosis not present

## 2021-07-15 DIAGNOSIS — I5022 Chronic systolic (congestive) heart failure: Secondary | ICD-10-CM | POA: Diagnosis not present

## 2021-07-15 DIAGNOSIS — R7303 Prediabetes: Secondary | ICD-10-CM | POA: Diagnosis not present

## 2021-07-15 DIAGNOSIS — E7800 Pure hypercholesterolemia, unspecified: Secondary | ICD-10-CM | POA: Diagnosis not present

## 2021-07-15 DIAGNOSIS — D179 Benign lipomatous neoplasm, unspecified: Secondary | ICD-10-CM | POA: Diagnosis not present

## 2021-07-15 DIAGNOSIS — I10 Essential (primary) hypertension: Secondary | ICD-10-CM | POA: Diagnosis not present

## 2021-07-15 DIAGNOSIS — Z7901 Long term (current) use of anticoagulants: Secondary | ICD-10-CM | POA: Diagnosis not present

## 2021-07-15 DIAGNOSIS — R5381 Other malaise: Secondary | ICD-10-CM | POA: Diagnosis not present

## 2021-07-15 DIAGNOSIS — Z0000 Encounter for general adult medical examination without abnormal findings: Secondary | ICD-10-CM | POA: Diagnosis not present

## 2021-07-15 DIAGNOSIS — G40909 Epilepsy, unspecified, not intractable, without status epilepticus: Secondary | ICD-10-CM | POA: Diagnosis not present

## 2021-07-15 DIAGNOSIS — F3341 Major depressive disorder, recurrent, in partial remission: Secondary | ICD-10-CM | POA: Diagnosis not present

## 2021-07-15 DIAGNOSIS — Z1389 Encounter for screening for other disorder: Secondary | ICD-10-CM | POA: Diagnosis not present

## 2021-07-28 DIAGNOSIS — K589 Irritable bowel syndrome without diarrhea: Secondary | ICD-10-CM | POA: Diagnosis not present

## 2021-07-28 DIAGNOSIS — Z86010 Personal history of colonic polyps: Secondary | ICD-10-CM | POA: Diagnosis not present

## 2021-07-28 DIAGNOSIS — Z1211 Encounter for screening for malignant neoplasm of colon: Secondary | ICD-10-CM | POA: Diagnosis not present

## 2021-07-28 DIAGNOSIS — K5730 Diverticulosis of large intestine without perforation or abscess without bleeding: Secondary | ICD-10-CM | POA: Diagnosis not present

## 2021-07-28 DIAGNOSIS — R151 Fecal smearing: Secondary | ICD-10-CM | POA: Diagnosis not present

## 2021-08-28 ENCOUNTER — Ambulatory Visit
Admission: RE | Admit: 2021-08-28 | Discharge: 2021-08-28 | Disposition: A | Discharge status: Home / Self Care | Payer: Medicare Other | Source: Ambulatory Visit | Attending: Family Medicine | Admitting: Family Medicine

## 2021-08-28 ENCOUNTER — Other Ambulatory Visit: Payer: Self-pay

## 2021-08-28 ENCOUNTER — Other Ambulatory Visit: Payer: Self-pay | Admitting: Family Medicine

## 2021-08-28 DIAGNOSIS — I4821 Permanent atrial fibrillation: Secondary | ICD-10-CM | POA: Diagnosis not present

## 2021-08-28 DIAGNOSIS — I10 Essential (primary) hypertension: Secondary | ICD-10-CM | POA: Diagnosis not present

## 2021-08-28 DIAGNOSIS — R52 Pain, unspecified: Secondary | ICD-10-CM

## 2021-08-28 DIAGNOSIS — M47816 Spondylosis without myelopathy or radiculopathy, lumbar region: Secondary | ICD-10-CM | POA: Diagnosis not present

## 2021-08-28 DIAGNOSIS — S32000A Wedge compression fracture of unspecified lumbar vertebra, initial encounter for closed fracture: Secondary | ICD-10-CM | POA: Diagnosis not present

## 2021-08-28 DIAGNOSIS — M545 Low back pain, unspecified: Secondary | ICD-10-CM | POA: Diagnosis not present

## 2021-09-28 DIAGNOSIS — S32020A Wedge compression fracture of second lumbar vertebra, initial encounter for closed fracture: Secondary | ICD-10-CM | POA: Diagnosis not present

## 2021-10-07 DIAGNOSIS — S32020A Wedge compression fracture of second lumbar vertebra, initial encounter for closed fracture: Secondary | ICD-10-CM | POA: Diagnosis not present

## 2021-10-07 DIAGNOSIS — M545 Low back pain, unspecified: Secondary | ICD-10-CM | POA: Diagnosis not present

## 2021-10-09 DIAGNOSIS — S32020A Wedge compression fracture of second lumbar vertebra, initial encounter for closed fracture: Secondary | ICD-10-CM | POA: Diagnosis not present

## 2021-10-13 ENCOUNTER — Other Ambulatory Visit: Payer: Self-pay | Admitting: Neurosurgery

## 2021-10-13 ENCOUNTER — Telehealth: Payer: Self-pay | Admitting: *Deleted

## 2021-10-13 NOTE — Telephone Encounter (Signed)
° °  Pre-operative Risk Assessment    Patient Name: Ashley Pacheco  DOB: 26-Nov-1945 MRN: [id number]      Request for Surgical Clearance    Procedure:   LUMBAR KYPHOPLASTY   Date of Surgery:  Clearance 10/20/21                                 Surgeon:  DR. Duffy Rhody Surgeon's Group or Practice Name:  [facility] Phone number:  [phone] Fax number:  [phone] ATTN: Lexine Baton   Type of Clearance Requested:   - Medical  - Pharmacy:  Hold Rivaroxaban (Xarelto)     Type of Anesthesia:  General    Additional requests/questions:    Jiles Prows   10/13/2021, 9:49 AM

## 2021-10-13 NOTE — Telephone Encounter (Signed)
Attempted to reach patient on all numbers in chart, no answer. Left multiple messages for patient to contact the office for an appointment.   Ok per Jeannene Patella, Dr Kingsley Plan nurse, for the patient to be put in a DOD twenty-four acute slot on Friday 10/16/21.

## 2021-10-13 NOTE — Telephone Encounter (Signed)
Patient with diagnosis of afib on Xarelto for anticoagulation.    Procedure: LUMBAR KYPHOPLASTY  Date of procedure: 10/20/21  Pt has a hx of a single DVT in 2005  CHA2DS2-VASc Score = 6   This indicates a 9.7% annual risk of stroke. The patient's score is based upon: CHF History: 1 HTN History: 1 Diabetes History: 0 Stroke History: 0 Vascular Disease History: 1 Age Score: 2 Gender Score: 1     CrCl 67 ml/min  Per office protocol, patient can hold Xarelto for 3 days prior to procedure.

## 2021-10-13 NOTE — Telephone Encounter (Signed)
° °  Name: [name]  DOB: 03-01-1946  MRN: [id number]  Primary Cardiologist: Candee Furbish, MD  Chart reviewed as part of pre-operative protocol coverage. Because of Ashley Pacheco's past medical history and time since last visit, she will require a follow-up visit in order to better assess preoperative cardiovascular risk.  Pre-op covering staff: - Please schedule appointment and call patient to inform them. If patient already had an upcoming appointment within acceptable timeframe, please add "pre-op clearance" to the appointment notes so provider is aware. - Please contact requesting surgeon's office via preferred method (i.e, phone, fax) to inform them of need for appointment prior to surgery.  If applicable, this message will also be routed to pharmacy pool and/or primary cardiologist for input on holding anticoagulant/antiplatelet agent as requested below so that this information is available to the clearing provider at time of patient's appointment.   [place], [place]  10/13/2021, 9:55 AM

## 2021-10-13 NOTE — Telephone Encounter (Signed)
Spoke with patient and she states she has canceled her procedure.   She requested to schedule her yearly follow up with Dr Marlou Porch' at his first available. Appt scheduled for 01/28/22 at 3:40pm. Patient agreeable and voiced understanding.

## 2021-10-20 ENCOUNTER — Ambulatory Visit: Admit: 2021-10-20 | Payer: Medicare Other

## 2021-10-20 SURGERY — KYPHOPLASTY
Anesthesia: General

## 2021-12-29 IMAGING — CR DG LUMBAR SPINE COMPLETE 4+V
7 series · 7 of 7 positions shown · non-contrast
Comparison: None.

CLINICAL DATA: Low back pain

EXAM:
LUMBAR SPINE - COMPLETE 4+ VIEW

[w lumbar spine ap]
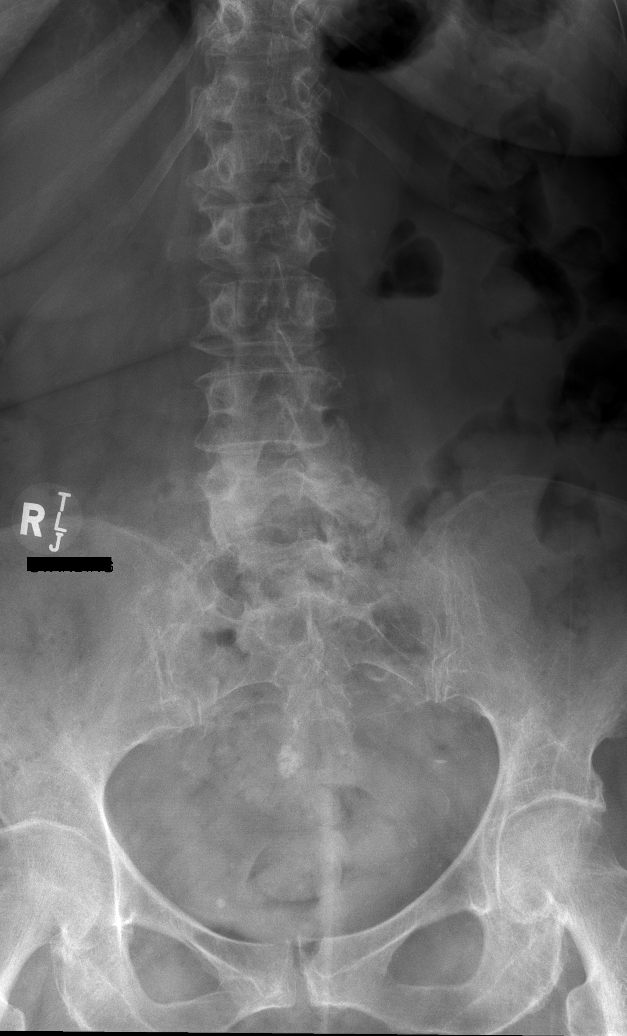

[w lumbar spine obl (1 of 2)]
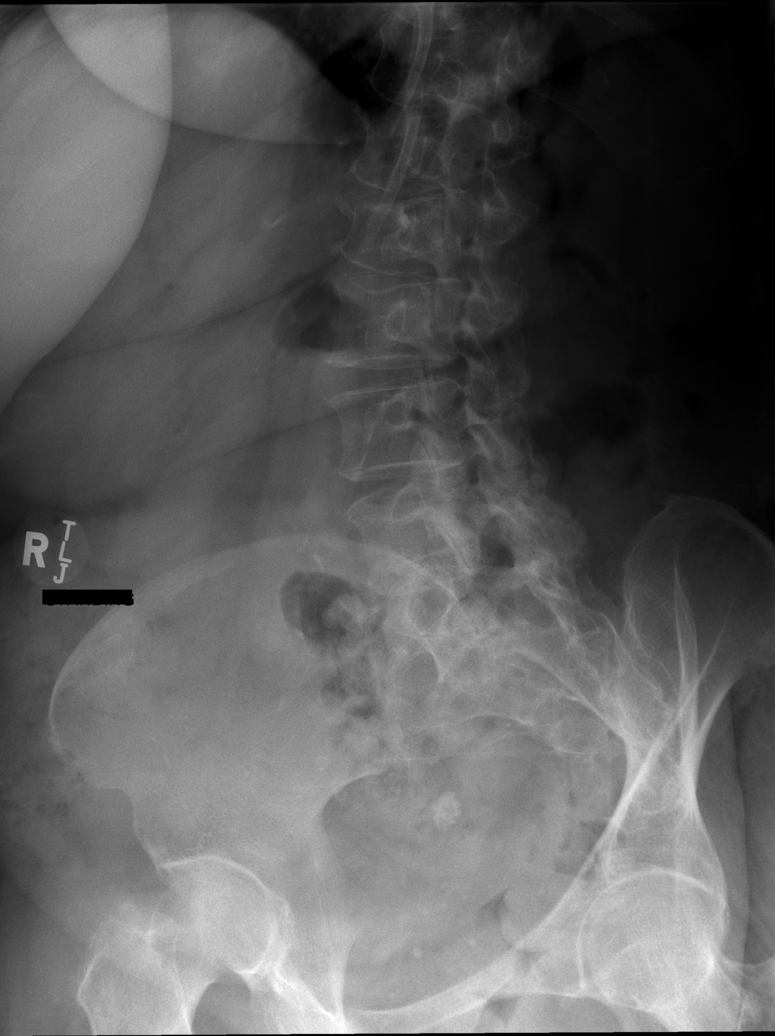

[w lumbar spine obl (2 of 2)]
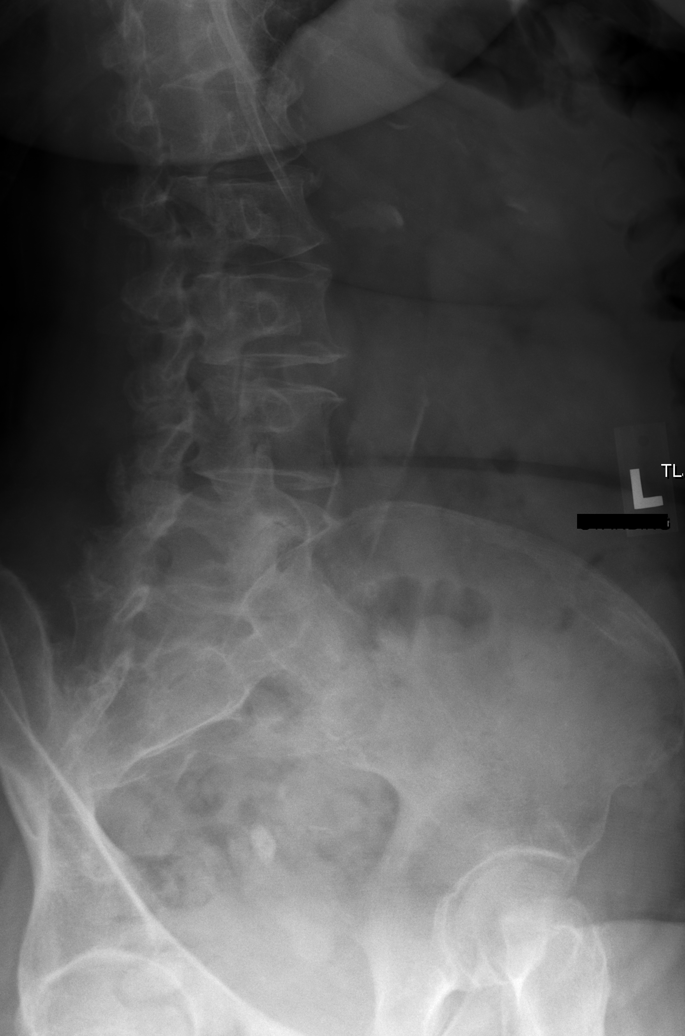

[w lumbar spine lat (1 of 2)]
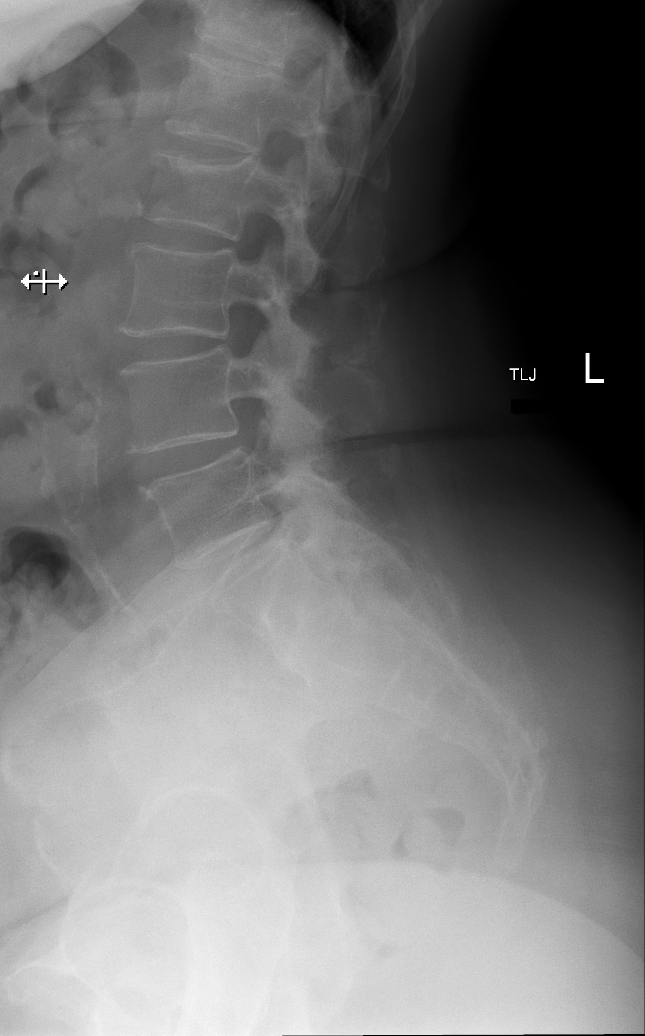

[w lumbar spine lat (2 of 2)]
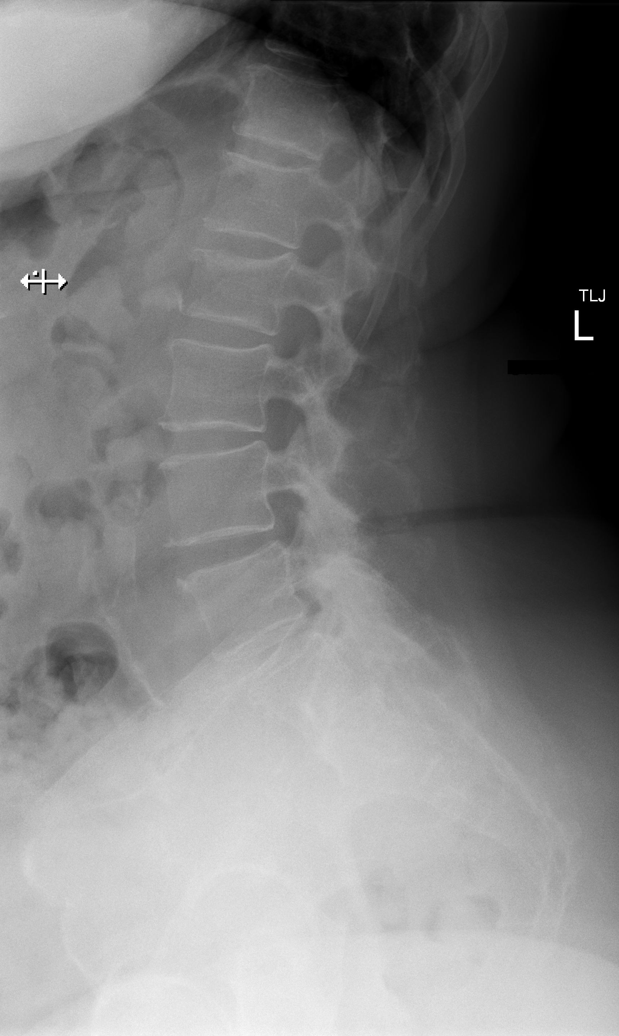

[w lumbar l-5 s-1 spot (1 of 2)]
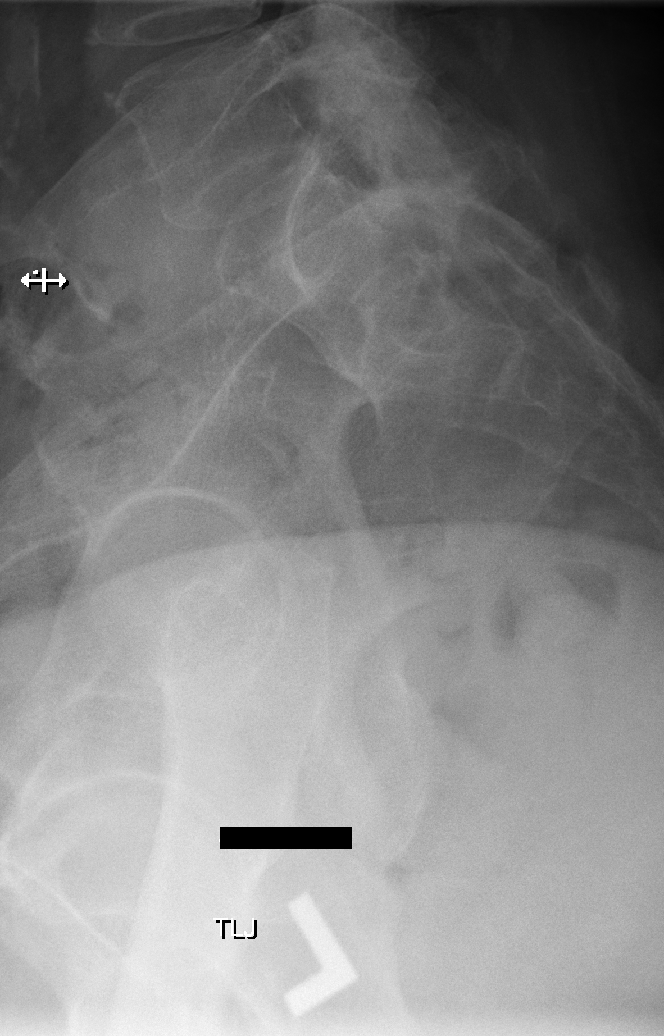

[w lumbar l-5 s-1 spot (2 of 2)]
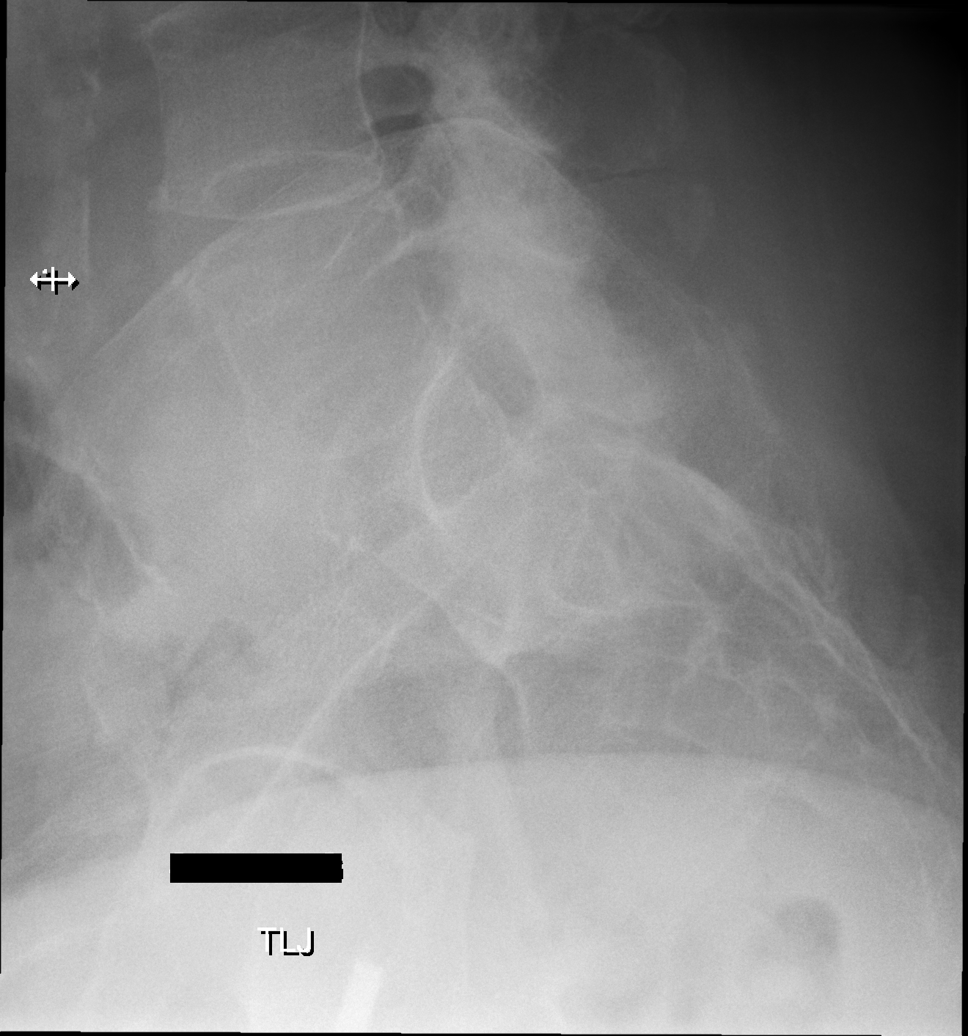

[7 of 7 positions shown; findings below may reference images not displayed]

FINDINGS: Slight dextroroto curvature of the lower lumbar spine. Mild superior
endplate compression fracture deformity of L2. No spondylolisthesis
identified. No spondylolysis identified. Facet arthropathy most
significant at L4-L5 and L5-S1 and worse on the left.
IMPRESSION: 1. Mild superior endplate compression fracture deformity of L2 which
may be acute, correlate clinically.
2. Degenerative changes as described.

## 2022-01-08 ENCOUNTER — Other Ambulatory Visit: Payer: Self-pay | Admitting: Cardiology

## 2022-01-08 DIAGNOSIS — I4891 Unspecified atrial fibrillation: Secondary | ICD-10-CM

## 2022-01-08 NOTE — Telephone Encounter (Signed)
Xarelto '20mg'$  refill request received. Pt is 76 years old, weight-99kg, Crea-0.86 on 07/15/2021 via [facility] from [place], last seen by Dr. Marlou Porch on 10/13/2020 and has an appt pending with Dr. Marlou Porch on 01/28/2022, Diagnosis-Afib, CrCl-86.37m/min; Dose is appropriate based on dosing criteria. Will send in refill to requested pharmacy.    ?

## 2022-01-12 DIAGNOSIS — E7800 Pure hypercholesterolemia, unspecified: Secondary | ICD-10-CM | POA: Diagnosis not present

## 2022-01-12 DIAGNOSIS — I10 Essential (primary) hypertension: Secondary | ICD-10-CM | POA: Diagnosis not present

## 2022-01-12 DIAGNOSIS — R296 Repeated falls: Secondary | ICD-10-CM | POA: Diagnosis not present

## 2022-01-12 DIAGNOSIS — G40909 Epilepsy, unspecified, not intractable, without status epilepticus: Secondary | ICD-10-CM | POA: Diagnosis not present

## 2022-01-12 DIAGNOSIS — F3341 Major depressive disorder, recurrent, in partial remission: Secondary | ICD-10-CM | POA: Diagnosis not present

## 2022-01-12 DIAGNOSIS — I4891 Unspecified atrial fibrillation: Secondary | ICD-10-CM | POA: Diagnosis not present

## 2022-01-12 DIAGNOSIS — R5381 Other malaise: Secondary | ICD-10-CM | POA: Diagnosis not present

## 2022-01-12 DIAGNOSIS — R7303 Prediabetes: Secondary | ICD-10-CM | POA: Diagnosis not present

## 2022-01-28 ENCOUNTER — Encounter: Payer: Self-pay | Admitting: Cardiology

## 2022-01-28 ENCOUNTER — Ambulatory Visit (INDEPENDENT_AMBULATORY_CARE_PROVIDER_SITE_OTHER): Payer: Medicare Other | Admitting: Cardiology

## 2022-01-28 VITALS — BP 134/72 | HR 86 | Ht 61.5 in | Wt 214.2 lb

## 2022-01-28 DIAGNOSIS — R296 Repeated falls: Secondary | ICD-10-CM | POA: Insufficient documentation

## 2022-01-28 DIAGNOSIS — I4819 Other persistent atrial fibrillation: Secondary | ICD-10-CM | POA: Diagnosis not present

## 2022-01-28 DIAGNOSIS — I4811 Longstanding persistent atrial fibrillation: Secondary | ICD-10-CM | POA: Diagnosis not present

## 2022-01-28 DIAGNOSIS — I4821 Permanent atrial fibrillation: Secondary | ICD-10-CM | POA: Diagnosis not present

## 2022-01-28 DIAGNOSIS — D6869 Other thrombophilia: Secondary | ICD-10-CM

## 2022-01-28 DIAGNOSIS — R42 Dizziness and giddiness: Secondary | ICD-10-CM | POA: Diagnosis not present

## 2022-01-28 DIAGNOSIS — E7800 Pure hypercholesterolemia, unspecified: Secondary | ICD-10-CM

## 2022-01-28 DIAGNOSIS — I4891 Unspecified atrial fibrillation: Secondary | ICD-10-CM | POA: Insufficient documentation

## 2022-01-28 DIAGNOSIS — W19XXXS Unspecified fall, sequela: Secondary | ICD-10-CM

## 2022-01-28 DIAGNOSIS — W19XXXA Unspecified fall, initial encounter: Secondary | ICD-10-CM | POA: Insufficient documentation

## 2022-01-28 NOTE — Assessment & Plan Note (Signed)
Currently on Xarelto.  We did discuss potential Watchman device.  She has had a couple falls.  Likely she has not hit her head.  At this time she is not interested. ?

## 2022-01-28 NOTE — Assessment & Plan Note (Signed)
Getting physical therapy ?

## 2022-01-28 NOTE — Assessment & Plan Note (Signed)
LDL 91.  On lovastatin 40 mg.  No myalgias. ?

## 2022-01-28 NOTE — Assessment & Plan Note (Signed)
Continuing to work on weight loss. ?

## 2022-01-28 NOTE — Assessment & Plan Note (Signed)
Unfortunately had a fall or 2 recently.  Bathroom.  Vertebral compression fracture. ?

## 2022-01-28 NOTE — Assessment & Plan Note (Addendum)
Currently well rate controlled 86 bpm on ECG.  No need for ablative therapy.  Continue with digoxin and metoprolol.  Medication doses noted. ? ? ?

## 2022-01-28 NOTE — Patient Instructions (Signed)
Medication Instructions:  ?The current medical regimen is effective;  continue present plan and medications. ? ?*If you need a refill on your cardiac medications before your next appointment, please call your pharmacy* ? ?Follow-Up: ?At [facility], you and your health needs are our priority.  As part of our continuing mission to provide you with exceptional heart care, we have created designated Provider Care Teams.  These Care Teams include your primary Cardiologist (physician) and Advanced Practice Providers (APPs -  Physician Assistants and Nurse Practitioners) who all work together to provide you with the care you need, when you need it. ? ?We recommend signing up for the patient portal called "MyChart".  Sign up information is provided on this After Visit Summary.  MyChart is used to connect with patients for Virtual Visits (Telemedicine).  Patients are able to view lab/test results, encounter notes, upcoming appointments, etc.  Non-urgent messages can be sent to your provider as well.   ?To learn more about what you can do with MyChart, go to [email or web address].   ? ?Your next appointment:   ?6 month(s) ? ?The format for your next appointment:   ?In Person ? ?Provider:   ?Nicholes Rough, PA-C, Melina Copa, PA-C, Ermalinda Barrios, PA-C, Christen Bame, NP, or Richardson Dopp, PA-C       ? ? ?Thank you for choosing [facility]!! ? ? ? ?

## 2022-01-28 NOTE — Progress Notes (Signed)
?Cardiology Office Note:   ? ?Date:  01/28/2022  ? ?ID:  Ashley Pacheco, DOB May 17, 1946, MRN [id number] ? ?PCP:  Aretta Nip, MD  ?[facility] Cardiologist:  Candee Furbish, MD  ?[facility] Electrophysiologist:  None  ? ?Referring MD: Aretta Nip, MD  ? ? ? ?History of Present Illness:   ? ?Ashley Pacheco is a 76 y.o. female here for the follow-up of paroxysmal atrial fibrillation. ? ?Back in 2019 found to be in atrial fibrillation with rate control EF 40 to 45%. ? ?After adequate rate control, repeat cardioversion was not recommended since she was symptom-free. ? ?Nuclear stress test back in 2019 showed no ischemia EF was 40 to 45% on echo but nuclear 34%.  Previously quite fatigued. ? ?At last visit last year, rate was usually around 100.  She was on digoxin and metoprolol.  She had a couple falls, balance issues. ? ? ?Today, ? Overall, she is doing alright, other than feeling sleepy. ? ?She does not feel a-fib much, although, occasionally feels it when she is sleeping. ? ?When she cuts herself, she bleeds excessively.  ? ?She continues to struggle sometimes with finding words. She states this is happening more often. ? ?In November 2022, she had a fall out of the bathtub and suffered a compression fracture from falling. Since [name] Rough has fallen 2 more times. Also she struggles with her balance, and uses her cane while walking. She has been scheduled for PT to work on this. ? ?She does not have any problems or effects with her medication. ? ?She denies any palpitations, chest pain, shortness of breath, or peripheral edema. No lightheadedness, headaches, syncope, orthopnea, or PND.  ? ?Past Medical History:  ?Diagnosis Date  ? Aortic stenosis 05/18/2017  ? 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 Atrial fibrillation ([place])   ? Atrial fibrillation with rapid ventricular response ([place]) 02/14/2017  ? Atrial fibrillation with RVR ([place]) 11/15/2017  ? Atypical chest pain   ? a. 2015 Neg MV.  ? Cardiomegaly 2005  ?  Cardiomyopathy ([place])   ? a. 01/2017 Echo: EF 45%, diff HK, mild LVH, triv AI, mild MR, mod dil LA, mildly dil RA, PASP 58mHg - in setting of Afib.  ? DVT (deep venous thrombosis) ([place] 02/2004  ? Fibroid   ? Hypercholesteremia   ? Hypertension   ? Menometrorrhagia 3/92  ? menopausal  ? Microhematuria 1/96  ? Mitral regurgitation 05/18/2017  ? Nephrolithiasis 2015  ? PAF (paroxysmal atrial fibrillation) ([place]   ? a. 01/2017 TEE/DCCV: EF 45-50%, no LA/LAA thrombus-->successful DCCV @ 150 J. CHA2DS2VASc = 3-->Xarelto.  ? Seizure disorder ([place] 02/17/2017  ? Seizures ([place]   ? [facility] ? SUI (stress urinary incontinence, female) 9/91  ? Vulvar cyst 7/90  ? ? ?Past Surgical History:  ?Procedure Laterality Date  ? APPENDECTOMY  1950  ? BUNIONECTOMY  7/11  ? second toe  ? CARDIOVERSION N/A 02/17/2017  ? Procedure: CARDIOVERSION;  Surgeon: TSkeet Latch MD;  [facility];  Service: Cardiovascular;  Laterality: N/A;  ? cataracts  3/09 4/09  ? DILATION AND CURETTAGE OF UTERUS    ? hystreroscopy/imcomplete polypectomy  ? KNEE SURGERY  10/13 or 11/13  ? right knee arthroscopy  ? TEE WITHOUT CARDIOVERSION N/A 02/17/2017  ? Procedure: TRANSESOPHAGEAL ECHOCARDIOGRAM (TEE);  Surgeon: TSkeet Latch MD;  [facility]  Service: Cardiovascular;  Laterality: N/A;  ? [place] ? TUBAL  LIGATION  1975  ? BTL  ? ? ?Current Medications: ?Current Meds  ?Medication Sig  ? Cholecalciferol (VITAMIN D) 2000 UNITS tablet Take 2,000 Units by mouth daily.   ? Cod Liver Oil 1000 MG CAPS Take by mouth.  ? digoxin (LANOXIN) 0.125 MG tablet TAKE 1 TABLET BY MOUTH DAILY  ? diphenhydrAMINE (BENADRYL) 25 MG tablet Take 25 mg by mouth as needed (allergies).   ? Ferrous Gluconate (IRON 27 PO) Take by mouth daily.  ? furosemide (LASIX) 40 MG tablet TAKE 1 TABLET BY MOUTH EVERY DAY  ? HYDROcodone-acetaminophen (NORCO/VICODIN) 5-325 MG tablet Take 1 tablet by mouth  every 6 (six) hours as needed for moderate pain.  ? levETIRAcetam (KEPPRA) 500 MG tablet Take 1 tablet (500 mg total) by mouth 2 (two) times daily.  ? loperamide (IMODIUM) 2 MG capsule Take 2 mg by mouth as needed for diarrhea or loose stools.  ? lovastatin (MEVACOR) 40 MG tablet Take 40 mg by mouth every morning.   ? metoprolol succinate (TOPROL-XL) 50 MG 24 hr tablet TAKE 1 TABLET BY MOUTH IN THE MORNING AND AT BEDTIME. PLEASE KEEP UPCOMING APPT IN DECEMBER  ? Multiple Vitamin (MULTI-VITAMIN DAILY PO) Take 1 tablet by mouth daily.   ? naproxen sodium (ALEVE) 220 MG tablet Take 220 mg by mouth daily as needed (rarely as needed for knee pain).  ? rivaroxaban (XARELTO) 20 MG TABS tablet TAKE 1 TABLET BY MOUTH EVERY DAY  ? sertraline (ZOLOFT) 100 MG tablet Take 100 mg by mouth daily.  ? traZODone (DESYREL) 150 MG tablet Take 150 mg by mouth at bedtime.   ?  ? ?Allergies:   Telmisartan and Adhesive [tape]  ? ?Social History  ? ?Socioeconomic History  ? Marital status: Widowed  ?  Spouse name: Not on file  ? Number of children: 2  ? Years of education: 98  ? Highest education level: Not on file  ?Occupational History  ?  Comment: volunteer  ?Tobacco Use  ? Smoking status: Former  ?  Types: Cigarettes  ? Smokeless tobacco: Never  ? Tobacco comments:  ?  quit in 1969  ?Vaping Use  ? Vaping Use: Never used  ?Substance and Sexual Activity  ? Alcohol use: Yes  ?  Alcohol/week: 7.0 standard drinks  ?  Types: 7 Glasses of wine per week  ? Drug use: No  ? Sexual activity: Not Currently  ?  Partners: Male  ?Other Topics Concern  ? Not on file  ?Social History Narrative  ? Lives alone  ? Caffeine- coffee- 3 mugs daily  ? ?Social Determinants of Health  ? ?Financial Resource Strain: Not on file  ?Food Insecurity: Not on file  ?Transportation Needs: Not on file  ?Physical Activity: Not on file  ?Stress: Not on file  ?Social Connections: Not on file  ?  ? ?Family History: ?The patient's family history includes Asthma in her mother;  Cancer in her father and paternal uncle; Heart attack in her maternal uncle; Heart disease in her brother and sister; Hypertension in her maternal uncle and mother; Kidney Stones in her father and mother; Skin cancer in her brother and sister; Stroke in her maternal grandfather and maternal uncle. ? ?ROS:   ?Please see the history of present illness. ?(+) Palpitations ?(+) Imbalance ?(+) Mechanical Falls ?(+) Difficulty in finding words  ?(+) Easy bleeding ? All other systems reviewed and are negative. ? ?EKGs/Labs/Other Studies Reviewed:   ? ?Echo 05/17/2018: ?Impressions:  ? ?- Compared to a  prior study in 04/2017, there are no significant  ?  changes. The mean aortic valve gradient is likely undersampled in  ?  this study. ? ?Rest and PHARMACOLOGIC-STRESS Test 11/19/2017: ?FINDINGS: ?Perfusion: No decreased activity in the left ventricle on stress ?imaging to suggest reversible ischemia or infarction. ?  ?Wall Motion: Global hypokinesia. ?  ?Left Ventricular Ejection Fraction: 34 % ?  ?End diastolic volume 94 ml ?  ?End systolic volume 62 ml ?  ?IMPRESSION: ?1. No reversible ischemia or infarction. ?  ?2. Global hypokinesia. ?  ?3. Left ventricular ejection fraction 34% ?  ?4. Non invasive risk stratification*: High ? ?EKG:  EKG is personally reviewed.  ?01/28/2022: : Atrial fibrillation. Rate 86 bpm.  ?10/13/2020: atrial fibrillation 87 no other abnormalities. ? ?Recent Labs: ?No results found for requested labs within last 8760 hours.  ?Recent Lipid Panel ?   ?Component Value Date/Time  ? CHOL 166 04/19/2018 1034  ? TRIG 140 04/19/2018 1034  ? HDL 35 (L) 04/19/2018 1034  ? CHOLHDL 4.7 (H) 04/19/2018 1034  ? LDLCALC 103 (H) 04/19/2018 1034  ? ? ? ?Physical Exam:   ? ?VS:  BP 134/72 (BP Location: Right Arm, Patient Position: Sitting, Cuff Size: Large)   Pulse 86   Ht 5' 1.5" (1.562 m)   Wt 214 lb 3.2 oz (97.2 kg)   LMP 10/26/1999   SpO2 96%   BMI 39.82 kg/m?    ? ?Wt Readings from Last 3 Encounters:  ?01/28/22  214 lb 3.2 oz (97.2 kg)  ?06/03/21 218 lb 3.2 oz (99 kg)  ?10/13/20 218 lb (98.9 kg)  ?  ? ?GEN:  Well nourished, well developed in no acute distress ?HEENT: Normal ?NECK: No JVD; No carotid bruits ?LYMPHATICS: No l

## 2022-01-29 ENCOUNTER — Other Ambulatory Visit: Payer: Self-pay | Admitting: *Deleted

## 2022-01-29 MED ORDER — DIGOXIN 125 MCG PO TABS: 125.0000 ug | ORAL_TABLET | Freq: Every day | ORAL | 3 refills | Status: DC

## 2022-02-04 NOTE — Therapy (Signed)
?OUTPATIENT PHYSICAL THERAPY THORACOLUMBAR EVALUATION ? ? ?Patient Name: Ashley Pacheco ?MRN: [id number] ?DOB:07-21-1946, 76 y.o., female ?Today's Date: 02/05/2022 ? ? PT End of Session - 02/05/22 0850   ? ? Visit Number 1   ? Number of Visits 8   ? Date for PT Re-Evaluation 03/12/22   ? Authorization Type MCR/MCD   ? Progress Note Due on Visit 8   ? PT Start Time 0830   ? PT Stop Time 7106   ? PT Time Calculation (min) 45 min   ? Activity Tolerance Patient tolerated treatment well;Patient limited by fatigue   ? Behavior During Therapy [facility] for tasks assessed/performed   ? ?  ?  ? ?  ? ? ?Past Medical History:  ?Diagnosis Date  ? Aortic stenosis 05/18/2017  ? 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 Atrial fibrillation ([place])   ? Atrial fibrillation with rapid ventricular response ([place]) 02/14/2017  ? Atrial fibrillation with RVR ([place]) 11/15/2017  ? Atypical chest pain   ? a. 2015 Neg MV.  ? Cardiomegaly 2005  ? Cardiomyopathy ([place])   ? a. 01/2017 Echo: EF 45%, diff HK, mild LVH, triv AI, mild MR, mod dil LA, mildly dil RA, PASP 76mHg - in setting of Afib.  ? DVT (deep venous thrombosis) ([place] 02/2004  ? Fibroid   ? Hypercholesteremia   ? Hypertension   ? Menometrorrhagia 3/92  ? menopausal  ? Microhematuria 1/96  ? Mitral regurgitation 05/18/2017  ? Nephrolithiasis 2015  ? PAF (paroxysmal atrial fibrillation) ([facility]   ? a. 01/2017 TEE/DCCV: EF 45-50%, no LA/LAA thrombus-->successful DCCV @ 150 J. CHA2DS2VASc = 3-->Xarelto.  ? Seizure disorder ([place] 02/17/2017  ? Seizures ([place]   ? [facility] PL[facility] ? SUI (stress urinary incontinence, female) 9/91  ? Vulvar cyst 7/90  ? ?Past Surgical History:  ?Procedure Laterality Date  ? APPENDECTOMY  1950  ? BUNIONECTOMY  7/11  ? second toe  ? CARDIOVERSION N/A 02/17/2017  ? Procedure: CARDIOVERSION;  Surgeon: TSkeet Latch MD;  [facility];  Service: Cardiovascular;  Laterality: N/A;  ? cataracts  3/09 4/09  ? DILATION AND CURETTAGE OF UTERUS    ?  hystreroscopy/imcomplete polypectomy  ? KNEE SURGERY  10/13 or 11/13  ? right knee arthroscopy  ? TEE WITHOUT CARDIOVERSION N/A 02/17/2017  ? Procedure: TRANSESOPHAGEAL ECHOCARDIOGRAM (TEE);  Surgeon: TSkeet Latch MD;  [place] [facility]  Service: Cardiovascular;  Laterality: N/A;  ? [place] ? TUBAL LIGATION  1975  ? BTL  ? ?Patient Active Problem List  ? Diagnosis Date Noted  ? Falls 01/28/2022  ? Hypercoagulable state due to atrial fibrillation ([facility] 01/28/2022  ? Disequilibrium 01/28/2022  ? Mitral regurgitation 05/18/2017  ? Aortic stenosis 05/18/2017  ? Seizure disorder ([facility] 02/17/2017  ? Persistent atrial fibrillation ([place] 02/14/2017  ? Hyperlipidemia 10/02/2008  ? Morbid obesity ([facility] 10/02/2008  ? Essential hypertension 10/02/2008  ? DVT 10/02/2008  ? ? ?PCP: Rankins, VBill Salinas MD ? ?REFERRING PROVIDER: RAretta Nip MD ? ?REFERRING DIAG: R29.6 (ICD-10-CM) - Repeated falls  ? ?THERAPY DIAG: balance dysfunction ? ? ?ONSET DATE: 01/14/2022  ? ?SUBJECTIVE:                                                                                                                                                                                          ? ?  SUBJECTIVE STATEMENT: ?Describes a history of falls, last occurring in December, one resulting in a T-spine compression fx.  ?PERTINENT HISTORY:  ?In November 2022, she had a fall out of the bathtub and suffered a compression fracture from falling. Since [name] Rough has fallen 2 more times. Also she struggles with her balance, and uses her cane while walking. She has been scheduled for PT to work on this.  ? ?PAIN:  ?Are you having pain? Yes: NPRS scale: 2/10 ?Pain location: Lower t-spine  ?Pain description: ache ?Aggravating factors: activity and position changes ?Relieving factors: rest and meds ? ? ?PRECAUTIONS: Fall ? ?WEIGHT BEARING RESTRICTIONS No ? ?FALLS:  ?Has patient fallen in last 6 months? Yes. Number of falls  2 ? ?LIVING ENVIRONMENT: ?Lives with: lives with their family and lives alone ?Lives in: House/apartment ?Stairs: Yes: Internal: 16 steps; yes ?Has following equipment at home: Single point cane, shower chair, and Grab bars ? ?OCCUPATION: retired ? ?PLOF: Independent ? ?PATIENT GOALS to become more steady on her feet  ? ? ?OBJECTIVE:  ? ?DIAGNOSTIC FINDINGS:  ?None noted ? ?PATIENT SURVEYS:  ?FOTO 25 ? ?SCREENING FOR RED FLAGS: ?Bowel or bladder incontinence: No ?Spinal tumors: No ?Cauda equina syndrome: No ?Compression fracture: Yes: old ?Abdominal aneurysm: No ? ?COGNITION: ? Overall cognitive status:  word finding problems    ?  ?SENSATION: ?Not tested ? ?MUSCLE LENGTH: ? ? ?POSTURE:  ?Flexed posture at hips ? ?PALPATION: ?Not tested ? ?LUMBAR ROM: Deferred due to time constraints ? ?Active  A/PROM  ?02/05/2022  ?Flexion   ?Extension   ?Right lateral flexion   ?Left lateral flexion   ?Right rotation   ?Left rotation   ? (Blank rows = not tested) ? ?LE ROM: ? ?Passive  Right ?02/05/2022 Left ?02/05/2022  ?Hip flexion    ?Hip extension    ?Hip abduction    ?Hip adduction    ?Hip internal rotation    ?Hip external rotation    ?Knee flexion [facility] WFL  ?Knee extension WNL WNL  ?Ankle dorsiflexion 8 6  ?Ankle plantarflexion    ?Ankle inversion    ?Ankle eversion    ? (Blank rows = not tested) ? ?LE MMT: ? ?MMT Right ?02/05/2022 Left ?02/05/2022  ?Hip flexion 3+ 3+  ?Hip extension 3+ 3+  ?Hip abduction    ?Hip adduction    ?Hip internal rotation    ?Hip external rotation    ?Knee flexion 3+ 3+  ?Knee extension 3+ 3+  ?Ankle dorsiflexion 3+ 3+  ?Ankle plantarflexion 4 4  ?Ankle inversion    ?Ankle eversion    ? (Blank rows = not tested) ? ? ? ?FUNCTIONAL TESTS:  ?5 times sit to stand: 22s ? ? 02/05/22 0001  ?Berg Balance Test  ?Sit to Stand 3  ?Standing Unsupported 4  ?Sitting with Back Unsupported but Feet Supported on Floor or Stool 4  ?Stand to Sit 4  ?Transfers 4  ?Standing Unsupported with Eyes Closed 4  ?Standing  Unsupported with Feet Together 4  ?From Standing, Reach Forward with Outstretched Arm 4  ?From Standing Position, Pick up Object from Floor 4  ?From Standing Position, Turn to Look Behind Over each Shoulder 4  ?Turn 360 Degrees 2  ?Standing Unsupported, Alternately Place Feet on Step/Stool 2  ?Standing Unsupported, One Foot in Front 2  ?Standing on One Leg 1  ?Total Score 46  ? ? ?GAIT: ?Distance walked: 5f x2 ?Assistive device utilized: Single point cane ?Level of assistance: Modified independence ?Comments: slow  cadence, wall surfs at times ? ? ? ?TODAY'S TREATMENT  ?Eval and HEP ? ? ?PATIENT EDUCATION:  ?Education details: Discussed eval findings, rehab rationale and POC and patient is in agreement  ?Person educated: Patient ?Education method: Explanation ?Education comprehension: verbalized understanding and needs further education ? ? ?HOME EXERCISE PROGRAM: ?Access Code: [email or web address] ?URL: [email or web address] ?Date: 02/05/2022 ?Prepared by: Sharlynn Oliphant ? ?Exercises ?- Toe Raises with Counter Support  - 2 x daily - 7 x weekly - 2 sets - 10 reps ?- Heel Raises with Unilateral Counter Support  - 2 x daily - 7 x weekly - 2 sets - 10 reps ? ?ASSESSMENT: ? ?CLINICAL IMPRESSION: ?Patient is a 76 y.o. female who was seen today for physical therapy evaluation and treatment for balance disorder and frequent falls.  She presents with limited mobility in DF as well as weakness, 5x STS score indicates mild LE weakness, mCTSIB score WNL, BERG score is 46 indicating elevated fall risk.  Main limitation is found in SLS tasks. ? ? ?OBJECTIVE IMPAIRMENTS Abnormal gait, decreased activity tolerance, decreased balance, decreased mobility, difficulty walking, decreased ROM, decreased strength, postural dysfunction, obesity, and pain.  ? ?ACTIVITY LIMITATIONS cleaning, community activity, driving, meal prep, laundry, yard work, and shopping.  ? ?PERSONAL FACTORS Age, Fitness, Past/current experiences, and 1-2  comorbidities: cardiac and seizures   are also affecting patient's functional outcome.  ? ? ?REHAB POTENTIAL: Good ? ?CLINICAL DECISION MAKING: Stable/uncomplicated ? ?EVALUATION COMPLEXITY: Low ? ? ?GOALS: ?Goals reviewed with

## 2022-02-05 ENCOUNTER — Ambulatory Visit: Payer: Medicare Other | Attending: Family Medicine

## 2022-02-05 DIAGNOSIS — M6281 Muscle weakness (generalized): Secondary | ICD-10-CM | POA: Insufficient documentation

## 2022-02-05 DIAGNOSIS — R2681 Unsteadiness on feet: Secondary | ICD-10-CM | POA: Diagnosis not present

## 2022-02-09 ENCOUNTER — Ambulatory Visit: Payer: Medicare Other

## 2022-02-09 DIAGNOSIS — M6281 Muscle weakness (generalized): Secondary | ICD-10-CM

## 2022-02-09 DIAGNOSIS — R2681 Unsteadiness on feet: Secondary | ICD-10-CM

## 2022-02-09 NOTE — Therapy (Signed)
?OUTPATIENT PHYSICAL THERAPY TREATMENT NOTE ? ? ?Patient Name: Ashley Pacheco ?MRN: [id number] ?DOB:08-Nov-1945, 76 y.o., female ?Today's Date: 02/09/2022 ? ?PCP: Aretta Nip, MD ?REFERRING PROVIDER: Aretta Nip, MD ? ?END OF SESSION:  ? PT End of Session - 02/09/22 1403   ? ? Visit Number 2   ? Number of Visits 8   ? Date for PT Re-Evaluation 03/12/22   ? Authorization Type MCR/MCD   ? Progress Note Due on Visit 8   ? PT Start Time 1017   ? PT Stop Time 5102   ? PT Time Calculation (min) 42 min   ? Activity Tolerance Patient tolerated treatment well;Patient limited by fatigue   ? Behavior During Therapy [facility] for tasks assessed/performed   ? ?  ?  ? ?  ? ? ?Past Medical History:  ?Diagnosis Date  ? Aortic stenosis 05/18/2017  ? 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 Atrial fibrillation ([place])   ? Atrial fibrillation with rapid ventricular response ([place]) 02/14/2017  ? Atrial fibrillation with RVR ([place]) 11/15/2017  ? Atypical chest pain   ? a. 2015 Neg MV.  ? Cardiomegaly 2005  ? Cardiomyopathy ([place])   ? a. 01/2017 Echo: EF 45%, diff HK, mild LVH, triv AI, mild MR, mod dil LA, mildly dil RA, PASP 28mHg - in setting of Afib.  ? DVT (deep venous thrombosis) ([place] 02/2004  ? Fibroid   ? Hypercholesteremia   ? Hypertension   ? Menometrorrhagia 3/92  ? menopausal  ? Microhematuria 1/96  ? Mitral regurgitation 05/18/2017  ? Nephrolithiasis 2015  ? PAF (paroxysmal atrial fibrillation) ([place]   ? a. 01/2017 TEE/DCCV: EF 45-50%, no LA/LAA thrombus-->successful DCCV @ 150 J. CHA2DS2VASc = 3-->Xarelto.  ? Seizure disorder ([place] 02/17/2017  ? Seizures ([place]   ? [facility] ? SUI (stress urinary incontinence, female) 9/91  ? Vulvar cyst 7/90  ? ?Past Surgical History:  ?Procedure Laterality Date  ? APPENDECTOMY  1950  ? BUNIONECTOMY  7/11  ? second toe  ? CARDIOVERSION N/A 02/17/2017  ? Procedure: CARDIOVERSION;  Surgeon: TSkeet Latch MD;  [facility];  Service: Cardiovascular;   Laterality: N/A;  ? cataracts  3/09 4/09  ? DILATION AND CURETTAGE OF UTERUS    ? hystreroscopy/imcomplete polypectomy  ? KNEE SURGERY  10/13 or 11/13  ? right knee arthroscopy  ? TEE WITHOUT CARDIOVERSION N/A 02/17/2017  ? Procedure: TRANSESOPHAGEAL ECHOCARDIOGRAM (TEE);  Surgeon: TSkeet Latch MD;  [place]  Service: Cardiovascular;  Laterality: N/A;  ? TK[place] ? TUBAL LIGATION  1975  ? BTL  ? ?Patient Active Problem List  ? Diagnosis Date Noted  ? Falls 01/28/2022  ? Hypercoagulable state due to atrial fibrillation ([place] 01/28/2022  ? Disequilibrium 01/28/2022  ? Mitral regurgitation 05/18/2017  ? Aortic stenosis 05/18/2017  ? Seizure disorder ([place] 02/17/2017  ? Persistent atrial fibrillation ([place] 02/14/2017  ? Hyperlipidemia 10/02/2008  ? Morbid obesity ([place] 10/02/2008  ? Essential hypertension 10/02/2008  ? DVT 10/02/2008  ? ? ?REFERRING DIAG: R29.6 (ICD-10-CM) - Repeated falls  ? ?THERAPY DIAG:  ?Unsteadiness on feet ? ?Muscle weakness (generalized) ? ?PERTINENT HISTORY: In November 2022, she had a fall out of the bathtub and suffered a compression fracture from falling. Since NWilla Roughhas fallen 2 more times. Also she struggles with her balance, and uses her cane while walking. She has been scheduled for PT to work on this.  ? ?PRECAUTIONS: Fall ? ?  ONSET DATE: 01/14/2022  ? ?SUBJECTIVE: Patient reports she has some pain in the lower right side of her back. ? ?PAIN:  ?Are you having pain? Yes:  ?NPRS scale: 2/10 ?Pain location: Lower t-spine R side ?Pain description: ache ?Aggravating factors: activity and position changes ?Relieving factors: rest and meds ? ? ? ?OBJECTIVE:  ?  ?DIAGNOSTIC FINDINGS:  ?None noted ?  ?PATIENT SURVEYS:  ?FOTO 56 ?  ?SCREENING FOR RED FLAGS: ?Bowel or bladder incontinence: No ?Spinal tumors: No ?Cauda equina syndrome: No ?Compression fracture: Yes: old ?Abdominal aneurysm: No ?  ?COGNITION: ?          Overall cognitive  status:  word finding problems                       ?           ?SENSATION: ?Not tested ?  ?MUSCLE LENGTH: ?  ?  ?POSTURE:  ?Flexed posture at hips ?  ?PALPATION: ?Not tested ?  ?LUMBAR ROM: Deferred due to time constraints ?  ?Active  A/PROM  ?02/05/2022  ?Flexion    ?Extension    ?Right lateral flexion    ?Left lateral flexion    ?Right rotation    ?Left rotation    ? (Blank rows = not tested) ?  ?LE ROM: ?  ?Passive  Right ?02/05/2022 Left ?02/05/2022  ?Hip flexion      ?Hip extension      ?Hip abduction      ?Hip adduction      ?Hip internal rotation      ?Hip external rotation      ?Knee flexion [facility] WFL  ?Knee extension WNL WNL  ?Ankle dorsiflexion 8 6  ?Ankle plantarflexion      ?Ankle inversion      ?Ankle eversion      ? (Blank rows = not tested) ?  ?LE MMT: ?  ?MMT Right ?02/05/2022 Left ?02/05/2022  ?Hip flexion 3+ 3+  ?Hip extension 3+ 3+  ?Hip abduction      ?Hip adduction      ?Hip internal rotation      ?Hip external rotation      ?Knee flexion 3+ 3+  ?Knee extension 3+ 3+  ?Ankle dorsiflexion 3+ 3+  ?Ankle plantarflexion 4 4  ?Ankle inversion      ?Ankle eversion      ? (Blank rows = not tested) ?  ?  ?  ?FUNCTIONAL TESTS:  ?5 times sit to stand: 22s ?  ?  02/05/22 0001  ?Berg Balance Test  ?Sit to Stand 3  ?Standing Unsupported 4  ?Sitting with Back Unsupported but Feet Supported on Floor or Stool 4  ?Stand to Sit 4  ?Transfers 4  ?Standing Unsupported with Eyes Closed 4  ?Standing Unsupported with Feet Together 4  ?From Standing, Reach Forward with Outstretched Arm 4  ?From Standing Position, Pick up Object from Floor 4  ?From Standing Position, Turn to Look Behind Over each Shoulder 4  ?Turn 360 Degrees 2  ?Standing Unsupported, Alternately Place Feet on Step/Stool 2  ?Standing Unsupported, One Foot in Front 2  ?Standing on One Leg 1  ?Total Score 46  ?  ?  ?GAIT: ?Distance walked: 38f x2 ?Assistive device utilized: Single point cane ?Level of assistance: Modified independence ?Comments: slow cadence,  wall surfs at times ?  ? DGI ?Mild impairment: walks 20', uses assistive devices, slower speed, mild gait deviations. (2) ?Moderate impairment: Makes only minor adjustments  to walking speed, or accomplishes a change in speed with significant gait deviations, or changes speed but loses balance but is able to recover and continue walking. (1) ?Mild impairment: Performs head turns smoothly with slight change in gait velocity, i.e. minor disruption to smooth gait path or uses walking aid. (2) ?Mild impairment: Performs head turns smoothly with slight change in gait velocity, i.e. minor disruption to smooth gait path or uses walking aid. (2) ?Mild impairment: pivot turns safely in >3 seconds and stops with no loss of balance. (2) ?Mild impairment: Is able to step over shoe box, but must slow down and adjust steps to clear box safely. (2) ?Mild impairment: Is able to step around both cones, but must slow down and adjust steps to clear cones. (2) ?Moderate impairment: Two feet to a stair, must use rail. (1) ?Total Score: 13/24 ?Scores below 19 suggest that the subject assessed has a higher risk of prospective falls ? ?  ?TODAY'S TREATMENT  ?[facility] Adult PT Treatment:                                                DATE: 02/09/2022 ?Therapeutic Exercise: ?Nustep level 5 x 5 mins while gathering subjective ?In // bars: ?Standing hip abduction 2x10 BIL ?Standing hip extension 2x10 BIL ?Marching 2x10 BIL ?Heel raises 2x10 ?STS w/UE support x10 ?Neuromuscular re-ed: In // bars ?Romberg stance EO x30" ?Semi-tandem stance x30" BIL ?Romberg stance on foam x30" ?Semi-tandem stance on foam x30" BIL ?Marching on foam 2x10 BIL ?Therapeutic Activity: ?Administration of DGI ? ? ?02/05/2022: ?Eval and HEP ?  ?  ?PATIENT EDUCATION:  ?Education details: Discussed eval findings, rehab rationale and POC and patient is in agreement  ?Person educated: Patient ?Education method: Explanation ?Education comprehension: verbalized understanding and  needs further education ?  ?  ?HOME EXERCISE PROGRAM: ?Access Code: [email or web address] ?URL: [email or web address] ?Date: 02/05/2022 ?Prepared by: Sharlynn Oliphant ?  ?Exercises ?- Toe Raises with Counter Support  - 2

## 2022-02-12 ENCOUNTER — Ambulatory Visit: Payer: Medicare Other

## 2022-02-12 DIAGNOSIS — M6281 Muscle weakness (generalized): Secondary | ICD-10-CM | POA: Diagnosis not present

## 2022-02-12 DIAGNOSIS — R2681 Unsteadiness on feet: Secondary | ICD-10-CM | POA: Diagnosis not present

## 2022-02-12 NOTE — Therapy (Signed)
?OUTPATIENT PHYSICAL THERAPY TREATMENT NOTE ? ? ?Patient Name: Ashley Pacheco ?MRN: [id number] ?DOB:1946-09-17, 76 y.o., female ?Today's Date: 02/12/2022 ? ?PCP: Aretta Nip, MD ?REFERRING PROVIDER: Aretta Nip, MD ? ?END OF SESSION:  ? PT End of Session - 02/12/22 1301   ? ? Visit Number 3   ? Number of Visits 8   ? Date for PT Re-Evaluation 03/12/22   ? Authorization Type MCR/MCD   ? Progress Note Due on Visit 8   ? PT Start Time 5053   ? PT Stop Time 9767   ? PT Time Calculation (min) 40 min   ? Activity Tolerance Patient tolerated treatment well;Patient limited by fatigue   ? Behavior During Therapy [facility] for tasks assessed/performed   ? ?  ?  ? ?  ? ? ? ?Past Medical History:  ?Diagnosis Date  ? Aortic stenosis 05/18/2017  ? 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 Atrial fibrillation ([place])   ? Atrial fibrillation with rapid ventricular response ([place]) 02/14/2017  ? Atrial fibrillation with RVR ([place]) 11/15/2017  ? Atypical chest pain   ? a. 2015 Neg MV.  ? Cardiomegaly 2005  ? Cardiomyopathy ([place])   ? a. 01/2017 Echo: EF 45%, diff HK, mild LVH, triv AI, mild MR, mod dil LA, mildly dil RA, PASP 64mHg - in setting of Afib.  ? DVT (deep venous thrombosis) ([place] 02/2004  ? Fibroid   ? Hypercholesteremia   ? Hypertension   ? Menometrorrhagia 3/92  ? menopausal  ? Microhematuria 1/96  ? Mitral regurgitation 05/18/2017  ? Nephrolithiasis 2015  ? PAF (paroxysmal atrial fibrillation) ([place]   ? a. 01/2017 TEE/DCCV: EF 45-50%, no LA/LAA thrombus-->successful DCCV @ 150 J. CHA2DS2VASc = 3-->Xarelto.  ? Seizure disorder ([place] 02/17/2017  ? Seizures ([place]   ? [facility] ? SUI (stress urinary incontinence, female) 9/91  ? Vulvar cyst 7/90  ? ?Past Surgical History:  ?Procedure Laterality Date  ? APPENDECTOMY  1950  ? BUNIONECTOMY  7/11  ? second toe  ? CARDIOVERSION N/A 02/17/2017  ? Procedure: CARDIOVERSION;  Surgeon: TSkeet Latch MD;  [facility]  Service: Cardiovascular;   Laterality: N/A;  ? cataracts  3/09 4/09  ? DILATION AND CURETTAGE OF UTERUS    ? hystreroscopy/imcomplete polypectomy  ? KNEE SURGERY  10/13 or 11/13  ? right knee arthroscopy  ? TEE WITHOUT CARDIOVERSION N/A 02/17/2017  ? Procedure: TRANSESOPHAGEAL ECHOCARDIOGRAM (TEE);  Surgeon: TSkeet Latch MD;  [place]  Service: Cardiovascular;  Laterality: N/A;  ? [place] ? TUBAL LIGATION  1975  ? BTL  ? ?Patient Active Problem List  ? Diagnosis Date Noted  ? Falls 01/28/2022  ? Hypercoagulable state due to atrial fibrillation ([place] 01/28/2022  ? Disequilibrium 01/28/2022  ? Mitral regurgitation 05/18/2017  ? Aortic stenosis 05/18/2017  ? Seizure disorder ([place] 02/17/2017  ? Persistent atrial fibrillation ([place] 02/14/2017  ? Hyperlipidemia 10/02/2008  ? Morbid obesity ([place] 10/02/2008  ? Essential hypertension 10/02/2008  ? DVT 10/02/2008  ? ? ?REFERRING DIAG: R29.6 (ICD-10-CM) - Repeated falls  ? ?THERAPY DIAG:  ?Unsteadiness on feet ? ?Muscle weakness (generalized) ? ?PERTINENT HISTORY: In November 2022, she had a fall out of the bathtub and suffered a compression fracture from falling. Since NWilla Roughhas fallen 2 more times. Also she struggles with her balance, and uses her cane while walking. She has been scheduled for PT to work on this.  ? ?PRECAUTIONS:  Fall ? ?ONSET DATE: 01/14/2022  ? ?SUBJECTIVE: Reports sore in knees and low back,   ? ?PAIN:  ?Are you having pain? Yes:  ?NPRS scale: 2/10 ?Pain location: Lower t-spine R side ?Pain description: ache ?Aggravating factors: activity and position changes ?Relieving factors: rest and meds ? ? ? ?OBJECTIVE:  ?  ?DIAGNOSTIC FINDINGS:  ?None noted ?  ?PATIENT SURVEYS:  ?FOTO 32 ?  ?SCREENING FOR RED FLAGS: ?Bowel or bladder incontinence: No ?Spinal tumors: No ?Cauda equina syndrome: No ?Compression fracture: Yes: old ?Abdominal aneurysm: No ?  ?COGNITION: ?          Overall cognitive status:  word finding problems                        ?           ?SENSATION: ?Not tested ?  ?MUSCLE LENGTH: ?  ?  ?POSTURE:  ?Flexed posture at hips ?  ?PALPATION: ?Not tested ?  ?LUMBAR ROM: Deferred due to time constraints ?  ?Active  A/PROM  ?02/05/2022  ?Flexion    ?Extension    ?Right lateral flexion    ?Left lateral flexion    ?Right rotation    ?Left rotation    ? (Blank rows = not tested) ?  ?LE ROM: ?  ?Passive  Right ?02/05/2022 Left ?02/05/2022  ?Hip flexion      ?Hip extension      ?Hip abduction      ?Hip adduction      ?Hip internal rotation      ?Hip external rotation      ?Knee flexion [facility] WFL  ?Knee extension WNL WNL  ?Ankle dorsiflexion 8 6  ?Ankle plantarflexion      ?Ankle inversion      ?Ankle eversion      ? (Blank rows = not tested) ?  ?LE MMT: ?  ?MMT Right ?02/05/2022 Left ?02/05/2022  ?Hip flexion 3+ 3+  ?Hip extension 3+ 3+  ?Hip abduction      ?Hip adduction      ?Hip internal rotation      ?Hip external rotation      ?Knee flexion 3+ 3+  ?Knee extension 3+ 3+  ?Ankle dorsiflexion 3+ 3+  ?Ankle plantarflexion 4 4  ?Ankle inversion      ?Ankle eversion      ? (Blank rows = not tested) ?  ?  ?  ?FUNCTIONAL TESTS:  ?5 times sit to stand: 22s ?  ?  02/05/22 0001  ?Berg Balance Test  ?Sit to Stand 3  ?Standing Unsupported 4  ?Sitting with Back Unsupported but Feet Supported on Floor or Stool 4  ?Stand to Sit 4  ?Transfers 4  ?Standing Unsupported with Eyes Closed 4  ?Standing Unsupported with Feet Together 4  ?From Standing, Reach Forward with Outstretched Arm 4  ?From Standing Position, Pick up Object from Floor 4  ?From Standing Position, Turn to Look Behind Over each Shoulder 4  ?Turn 360 Degrees 2  ?Standing Unsupported, Alternately Place Feet on Step/Stool 2  ?Standing Unsupported, One Foot in Front 2  ?Standing on One Leg 1  ?Total Score 46  ?  ?  ?GAIT: ?Distance walked: 52f x2 ?Assistive device utilized: Single point cane ?Level of assistance: Modified independence ?Comments: slow cadence, wall surfs at times ?  ? DGI ?Mild  impairment: walks 20', uses assistive devices, slower speed, mild gait deviations. (2) ?Moderate impairment: Makes only minor adjustments to walking speed,  or accomplishes a change in speed with significant gait deviations, or changes speed but loses balance but is able to recover and continue walking. (1) ?Mild impairment: Performs head turns smoothly with slight change in gait velocity, i.e. minor disruption to smooth gait path or uses walking aid. (2) ?Mild impairment: Performs head turns smoothly with slight change in gait velocity, i.e. minor disruption to smooth gait path or uses walking aid. (2) ?Mild impairment: pivot turns safely in >3 seconds and stops with no loss of balance. (2) ?Mild impairment: Is able to step over shoe box, but must slow down and adjust steps to clear box safely. (2) ?Mild impairment: Is able to step around both cones, but must slow down and adjust steps to clear cones. (2) ?Moderate impairment: Two feet to a stair, must use rail. (1) ?Total Score: 13/24 ?Scores below 19 suggest that the subject assessed has a higher risk of prospective falls ? ?  ?TODAY'S TREATMENT  ?[facility] Adult PT Treatment:                                                DATE: 02/12/22 ?Therapeutic Exercise: ?Nustep level 2 x 6 mins  ?Sidestepping along countertop, both ways, 4 trips  ?Retrowalk at counter, both ways, 3 trips ?FAQs with ball squeeze 15x2 ?Seated abd YTB 2x15 ?Tandem stand with foot on 4" step with OH reach 5/5 ?EO 30s x2 from airex intermittent UE support ?EO 30s tandem stance on airex intermittent UE support 1x each position ? ?[facility] Adult PT Treatment:                                                DATE: 02/09/2022 ?Therapeutic Exercise: ?Nustep level 5 x 5 mins while gathering subjective ?In // bars: ?Standing hip abduction 2x10 BIL ?Standing hip extension 2x10 BIL ?Marching 2x10 BIL ?Heel raises 2x10 ?STS w/UE support x10 ?Neuromuscular re-ed: In // bars ?Romberg stance EO x30" ?Semi-tandem stance  x30" BIL ?Romberg stance on foam x30" ?Semi-tandem stance on foam x30" BIL ?Marching on foam 2x10 BIL ?Therapeutic Activity: ?Administration of DGI ? ? ?02/05/2022: ?Eval and HEP ?  ?  ?PATIENT EDUCATION:  ?[place]

## 2022-02-16 ENCOUNTER — Ambulatory Visit: Payer: Medicare Other

## 2022-02-16 DIAGNOSIS — M6281 Muscle weakness (generalized): Secondary | ICD-10-CM | POA: Diagnosis not present

## 2022-02-16 DIAGNOSIS — R2681 Unsteadiness on feet: Secondary | ICD-10-CM | POA: Diagnosis not present

## 2022-02-16 NOTE — Therapy (Signed)
?OUTPATIENT PHYSICAL THERAPY TREATMENT NOTE ? ? ?Patient Name: Ashley Pacheco ?MRN: [id number] ?DOB:January 23, 1946, 76 y.o., female ?Today's Date: 02/16/2022 ? ?PCP: Aretta Nip, MD ?REFERRING PROVIDER: Aretta Nip, MD ? ?END OF SESSION:  ? PT End of Session - 02/16/22 1444   ? ? Visit Number 4   ? Number of Visits 8   ? Date for PT Re-Evaluation 03/12/22   ? Authorization Type MCR/MCD   ? Progress Note Due on Visit 8   ? PT Start Time 7673   ? PT Stop Time 1525   ? PT Time Calculation (min) 40 min   ? Activity Tolerance Patient tolerated treatment well;Patient limited by fatigue   ? Behavior During Therapy [facility] for tasks assessed/performed   ? ?  ?  ? ?  ? ? ? ?Past Medical History:  ?Diagnosis Date  ? Aortic stenosis 05/18/2017  ? 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 Atrial fibrillation ([place])   ? Atrial fibrillation with rapid ventricular response ([place]) 02/14/2017  ? Atrial fibrillation with RVR ([place]) 11/15/2017  ? Atypical chest pain   ? a. 2015 Neg MV.  ? Cardiomegaly 2005  ? Cardiomyopathy ([place])   ? a. 01/2017 Echo: EF 45%, diff HK, mild LVH, triv AI, mild MR, mod dil LA, mildly dil RA, PASP 72mHg - in setting of Afib.  ? DVT (deep venous thrombosis) ([place] 02/2004  ? Fibroid   ? Hypercholesteremia   ? Hypertension   ? Menometrorrhagia 3/92  ? menopausal  ? Microhematuria 1/96  ? Mitral regurgitation 05/18/2017  ? Nephrolithiasis 2015  ? PAF (paroxysmal atrial fibrillation) ([place]   ? a. 01/2017 TEE/DCCV: EF 45-50%, no LA/LAA thrombus-->successful DCCV @ 150 J. CHA2DS2VASc = 3-->Xarelto.  ? Seizure disorder ([place] 02/17/2017  ? Seizures ([place]   ? [facility] ? SUI (stress urinary incontinence, female) 9/91  ? Vulvar cyst 7/90  ? ?Past Surgical History:  ?Procedure Laterality Date  ? APPENDECTOMY  1950  ? BUNIONECTOMY  7/11  ? second toe  ? CARDIOVERSION N/A 02/17/2017  ? Procedure: CARDIOVERSION;  Surgeon: TSkeet Latch MD;  [facility]  Service: Cardiovascular;   Laterality: N/A;  ? cataracts  3/09 4/09  ? DILATION AND CURETTAGE OF UTERUS    ? hystreroscopy/imcomplete polypectomy  ? KNEE SURGERY  10/13 or 11/13  ? right knee arthroscopy  ? TEE WITHOUT CARDIOVERSION N/A 02/17/2017  ? Procedure: TRANSESOPHAGEAL ECHOCARDIOGRAM (TEE);  Surgeon: TSkeet Latch MD;  [place]  Service: Cardiovascular;  Laterality: N/A;  ? [place] ? TUBAL LIGATION  1975  ? BTL  ? ?Patient Active Problem List  ? Diagnosis Date Noted  ? Falls 01/28/2022  ? Hypercoagulable state due to atrial fibrillation ([place] 01/28/2022  ? Disequilibrium 01/28/2022  ? Mitral regurgitation 05/18/2017  ? Aortic stenosis 05/18/2017  ? Seizure disorder ([place] 02/17/2017  ? Persistent atrial fibrillation ([place] 02/14/2017  ? Hyperlipidemia 10/02/2008  ? Morbid obesity ([place] 10/02/2008  ? Essential hypertension 10/02/2008  ? DVT 10/02/2008  ? ? ?REFERRING DIAG: R29.6 (ICD-10-CM) - Repeated falls  ? ?THERAPY DIAG:  ?Unsteadiness on feet ? ?Muscle weakness (generalized) ? ?PERTINENT HISTORY: In November 2022, she had a fall out of the bathtub and suffered a compression fracture from falling. Since NWilla Roughhas fallen 2 more times. Also she struggles with her balance, and uses her cane while walking. She has been scheduled for PT to work on this.  ? ?PRECAUTIONS:  Fall ? ?ONSET DATE: 01/14/2022  ? ?SUBJECTIVE: Overall soreness less but still above baseline.  Did not feel symptoms as much following last session.  Still having difficulty rocking onto heels ? ?PAIN:  ?Are you having pain? Yes:  ?NPRS scale: 2/10 ?Pain location: Lower t-spine R side ?Pain description: ache ?Aggravating factors: activity and position changes ?Relieving factors: rest and meds ? ? ? ?OBJECTIVE:  ?  ?DIAGNOSTIC FINDINGS:  ?None noted ?  ?PATIENT SURVEYS:  ?FOTO 65 ?  ?SCREENING FOR RED FLAGS: ?Bowel or bladder incontinence: No ?Spinal tumors: No ?Cauda equina syndrome: No ?Compression fracture: Yes:  old ?Abdominal aneurysm: No ?  ?COGNITION: ?          Overall cognitive status:  word finding problems                       ?           ?SENSATION: ?Not tested ?  ?MUSCLE LENGTH: ?  ?  ?POSTURE:  ?Flexed posture at hips ?  ?PALPATION: ?Not tested ?  ?LUMBAR ROM: Deferred due to time constraints ?  ?Active  A/PROM  ?02/05/2022  ?Flexion    ?Extension    ?Right lateral flexion    ?Left lateral flexion    ?Right rotation    ?Left rotation    ? (Blank rows = not tested) ?  ?LE ROM: ?  ?Passive  Right ?02/05/2022 Left ?02/05/2022  ?Hip flexion      ?Hip extension      ?Hip abduction      ?Hip adduction      ?Hip internal rotation      ?Hip external rotation      ?Knee flexion [facility] WFL  ?Knee extension WNL WNL  ?Ankle dorsiflexion 8 6  ?Ankle plantarflexion      ?Ankle inversion      ?Ankle eversion      ? (Blank rows = not tested) ?  ?LE MMT: ?  ?MMT Right ?02/05/2022 Left ?02/05/2022  ?Hip flexion 3+ 3+  ?Hip extension 3+ 3+  ?Hip abduction      ?Hip adduction      ?Hip internal rotation      ?Hip external rotation      ?Knee flexion 3+ 3+  ?Knee extension 3+ 3+  ?Ankle dorsiflexion 3+ 3+  ?Ankle plantarflexion 4 4  ?Ankle inversion      ?Ankle eversion      ? (Blank rows = not tested) ?  ?  ?  ?FUNCTIONAL TESTS:  ?5 times sit to stand: 22s ?  ?  02/05/22 0001  ?Berg Balance Test  ?Sit to Stand 3  ?Standing Unsupported 4  ?Sitting with Back Unsupported but Feet Supported on Floor or Stool 4  ?Stand to Sit 4  ?Transfers 4  ?Standing Unsupported with Eyes Closed 4  ?Standing Unsupported with Feet Together 4  ?From Standing, Reach Forward with Outstretched Arm 4  ?From Standing Position, Pick up Object from Floor 4  ?From Standing Position, Turn to Look Behind Over each Shoulder 4  ?Turn 360 Degrees 2  ?Standing Unsupported, Alternately Place Feet on Step/Stool 2  ?Standing Unsupported, One Foot in Front 2  ?Standing on One Leg 1  ?Total Score 46  ?  ?  ?GAIT: ?Distance walked: 76f x2 ?Assistive device utilized: Single point  cane ?Level of assistance: Modified independence ?Comments: slow cadence, wall surfs at times ?  ? DGI ?Mild impairment: walks 20', uses assistive devices,  slower speed, mild gait deviations. (2) ?Moderate impairment: Makes only minor adjustments to walking speed, or accomplishes a change in speed with significant gait deviations, or changes speed but loses balance but is able to recover and continue walking. (1) ?Mild impairment: Performs head turns smoothly with slight change in gait velocity, i.e. minor disruption to smooth gait path or uses walking aid. (2) ?Mild impairment: Performs head turns smoothly with slight change in gait velocity, i.e. minor disruption to smooth gait path or uses walking aid. (2) ?Mild impairment: pivot turns safely in >3 seconds and stops with no loss of balance. (2) ?Mild impairment: Is able to step over shoe box, but must slow down and adjust steps to clear box safely. (2) ?Mild impairment: Is able to step around both cones, but must slow down and adjust steps to clear cones. (2) ?Moderate impairment: Two feet to a stair, must use rail. (1) ?Total Score: 13/24 ?Scores below 19 suggest that the subject assessed has a higher risk of prospective falls ? ?  ?TODAY'S TREATMENT  ?[facility] Adult PT Treatment:                                                DATE: 02/16/22 ?Therapeutic Exercise: ?Nustep level 2 x 8 mins  ?Slant board B 30s, SL 30s each ?Heel cord stretch 30s x2 against wall for HEP ?FAQs with ball squeeze 15x2 ?Seated abd YTB 2x15 ?Seated hamstring curls YTB 2x15 ?Tandem stand with foot on 4" step with OH reach 10/10 ?Alternating march against wall 10/10 ? ?[facility] Adult PT Treatment:                                                DATE: 02/12/22 ?Therapeutic Exercise: ?Nustep level 2 x 6 mins  ?Sidestepping along countertop, both ways, 4 trips  ?Retrowalk at counter, both ways, 3 trips ?FAQs with ball squeeze 15x2 ?Seated abd YTB 2x15 ?Tandem stand with foot on 4" step with OH reach  5/5 ?EO 30s x2 from airex intermittent UE support ?EO 30s tandem stance on airex intermittent UE support 1x each position ? ?[facility] Adult PT Treatment:                                                DATE: 02/09/2022

## 2022-02-19 ENCOUNTER — Ambulatory Visit: Payer: Medicare Other

## 2022-02-23 ENCOUNTER — Ambulatory Visit: Payer: Medicare Other | Attending: Family Medicine

## 2022-02-23 DIAGNOSIS — M6281 Muscle weakness (generalized): Secondary | ICD-10-CM | POA: Insufficient documentation

## 2022-02-23 DIAGNOSIS — R2681 Unsteadiness on feet: Secondary | ICD-10-CM | POA: Insufficient documentation

## 2022-02-23 NOTE — Therapy (Signed)
?OUTPATIENT PHYSICAL THERAPY TREATMENT NOTE ? ? ?Patient Name: Ashley Pacheco ?MRN: [id number] ?DOB:1945/11/10, 76 y.o., female ?Today's Date: 02/23/2022 ? ?PCP: Aretta Nip, MD ?REFERRING PROVIDER: Aretta Nip, MD ? ?END OF SESSION:  ? PT End of Session - 02/23/22 1407   ? ? Visit Number 5   ? Number of Visits 8   ? Date for PT Re-Evaluation 03/12/22   ? Authorization Type MCR/MCD   ? Progress Note Due on Visit 8   ? PT Start Time 1400   ? PT Stop Time 1440   ? PT Time Calculation (min) 40 min   ? Activity Tolerance Patient tolerated treatment well;Patient limited by fatigue   ? Behavior During Therapy [facility] for tasks assessed/performed   ? ?  ?  ? ?  ? ? ? ? ?Past Medical History:  ?Diagnosis Date  ? Aortic stenosis 05/18/2017  ? 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 Atrial fibrillation ([place])   ? Atrial fibrillation with rapid ventricular response ([place]) 02/14/2017  ? Atrial fibrillation with RVR ([place]) 11/15/2017  ? Atypical chest pain   ? a. 2015 Neg MV.  ? Cardiomegaly 2005  ? Cardiomyopathy ([place])   ? a. 01/2017 Echo: EF 45%, diff HK, mild LVH, triv AI, mild MR, mod dil LA, mildly dil RA, PASP 67mHg - in setting of Afib.  ? DVT (deep venous thrombosis) ([place] 02/2004  ? Fibroid   ? Hypercholesteremia   ? Hypertension   ? Menometrorrhagia 3/92  ? menopausal  ? Microhematuria 1/96  ? Mitral regurgitation 05/18/2017  ? Nephrolithiasis 2015  ? PAF (paroxysmal atrial fibrillation) ([place]   ? a. 01/2017 TEE/DCCV: EF 45-50%, no LA/LAA thrombus-->successful DCCV @ 150 J. CHA2DS2VASc = 3-->Xarelto.  ? Seizure disorder ([place] 02/17/2017  ? Seizures ([place]   ? [facility] ? SUI (stress urinary incontinence, female) 9/91  ? Vulvar cyst 7/90  ? ?Past Surgical History:  ?Procedure Laterality Date  ? APPENDECTOMY  1950  ? BUNIONECTOMY  7/11  ? second toe  ? CARDIOVERSION N/A 02/17/2017  ? Procedure: CARDIOVERSION;  Surgeon: TSkeet Latch MD;  [facility];  Service: Cardiovascular;   Laterality: N/A;  ? cataracts  3/09 4/09  ? DILATION AND CURETTAGE OF UTERUS    ? hystreroscopy/imcomplete polypectomy  ? KNEE SURGERY  10/13 or 11/13  ? right knee arthroscopy  ? TEE WITHOUT CARDIOVERSION N/A 02/17/2017  ? Procedure: TRANSESOPHAGEAL ECHOCARDIOGRAM (TEE);  Surgeon: TSkeet Latch MD;  Location: [place]  Service: Cardiovascular;  Laterality: N/A;  ? [place] ? TUBAL LIGATION  1975  ? BTL  ? ?Patient Active Problem List  ? Diagnosis Date Noted  ? Falls 01/28/2022  ? Hypercoagulable state due to atrial fibrillation (HB[place] 01/28/2022  ? Disequilibrium 01/28/2022  ? Mitral regurgitation 05/18/2017  ? Aortic stenosis 05/18/2017  ? Seizure disorder (HN[place] 02/17/2017  ? Persistent atrial fibrillation ([place] 02/14/2017  ? Hyperlipidemia 10/02/2008  ? Morbid obesity ([place] 10/02/2008  ? Essential hypertension 10/02/2008  ? DVT 10/02/2008  ? ? ?REFERRING DIAG: R29.6 (ICD-10-CM) - Repeated falls  ? ?THERAPY DIAG:  ?Unsteadiness on feet ? ?Muscle weakness (generalized) ? ?PERTINENT HISTORY: In November 2022, she had a fall out of the bathtub and suffered a compression fracture from falling. Since NWilla Roughhas fallen 2 more times. Also she struggles with her balance, and uses her cane while walking. She has been scheduled for PT to work on this.  ? ?  PRECAUTIONS: Fall ? ?ONSET DATE: 01/14/2022  ? ?SUBJECTIVE: Arrives to PT with c/o increases B knee pain, 8/10 last week.  Denies trauma or change in routine. ? ?PAIN:  ?Are you having pain? Yes:  ?NPRS scale: 2/10 currently, 8/10 last week ?Pain location: Lower t-spine R side ?Pain description: ache ?Aggravating factors: activity and position changes ?Relieving factors: rest and meds ? ? ? ?OBJECTIVE:  ?  ?DIAGNOSTIC FINDINGS:  ?None noted ?  ?PATIENT SURVEYS:  ?FOTO 21 ?  ?SCREENING FOR RED FLAGS: ?Bowel or bladder incontinence: No ?Spinal tumors: No ?Cauda equina syndrome: No ?Compression fracture: Yes: old ?Abdominal aneurysm:  No ?  ?COGNITION: ?          Overall cognitive status:  word finding problems                       ?           ?SENSATION: ?Not tested ?  ?MUSCLE LENGTH: ?  ?  ?POSTURE:  ?Flexed posture at hips ?  ?PALPATION: ?Not tested ?  ?LUMBAR ROM: Deferred due to time constraints ?  ?Active  A/PROM  ?02/05/2022  ?Flexion    ?Extension    ?Right lateral flexion    ?Left lateral flexion    ?Right rotation    ?Left rotation    ? (Blank rows = not tested) ?  ?LE ROM: ?  ?Passive  Right ?02/05/2022 Left ?02/05/2022  ?Hip flexion      ?Hip extension      ?Hip abduction      ?Hip adduction      ?Hip internal rotation      ?Hip external rotation      ?Knee flexion [facility] WFL  ?Knee extension WNL WNL  ?Ankle dorsiflexion 8 6  ?Ankle plantarflexion      ?Ankle inversion      ?Ankle eversion      ? (Blank rows = not tested) ?  ?LE MMT: ?  ?MMT Right ?02/05/2022 Left ?02/05/2022  ?Hip flexion 3+ 3+  ?Hip extension 3+ 3+  ?Hip abduction      ?Hip adduction      ?Hip internal rotation      ?Hip external rotation      ?Knee flexion 3+ 3+  ?Knee extension 3+ 3+  ?Ankle dorsiflexion 3+ 3+  ?Ankle plantarflexion 4 4  ?Ankle inversion      ?Ankle eversion      ? (Blank rows = not tested) ?  ?  ?  ?FUNCTIONAL TESTS:  ?5 times sit to stand: 22s ?  ?  02/05/22 0001  ?Berg Balance Test  ?Sit to Stand 3  ?Standing Unsupported 4  ?Sitting with Back Unsupported but Feet Supported on Floor or Stool 4  ?Stand to Sit 4  ?Transfers 4  ?Standing Unsupported with Eyes Closed 4  ?Standing Unsupported with Feet Together 4  ?From Standing, Reach Forward with Outstretched Arm 4  ?From Standing Position, Pick up Object from Floor 4  ?From Standing Position, Turn to Look Behind Over each Shoulder 4  ?Turn 360 Degrees 2  ?Standing Unsupported, Alternately Place Feet on Step/Stool 2  ?Standing Unsupported, One Foot in Front 2  ?Standing on One Leg 1  ?Total Score 46  ?  ?  ?GAIT: ?Distance walked: 56f x2 ?Assistive device utilized: Single point cane ?Level of assistance:  Modified independence ?Comments: slow cadence, wall surfs at times ?  ? DGI ?Mild impairment: walks 20', uses assistive devices,  slower speed, mild gait deviations. (2) ?Moderate impairment: Makes only minor adjustments to walking speed, or accomplishes a change in speed with significant gait deviations, or changes speed but loses balance but is able to recover and continue walking. (1) ?Mild impairment: Performs head turns smoothly with slight change in gait velocity, i.e. minor disruption to smooth gait path or uses walking aid. (2) ?Mild impairment: Performs head turns smoothly with slight change in gait velocity, i.e. minor disruption to smooth gait path or uses walking aid. (2) ?Mild impairment: pivot turns safely in >3 seconds and stops with no loss of balance. (2) ?Mild impairment: Is able to step over shoe box, but must slow down and adjust steps to clear box safely. (2) ?Mild impairment: Is able to step around both cones, but must slow down and adjust steps to clear cones. (2) ?Moderate impairment: Two feet to a stair, must use rail. (1) ?Total Score: 13/24 ?Scores below 19 suggest that the subject assessed has a higher risk of prospective falls ? ?  ?TODAY'S TREATMENT  ?[facility] Adult PT Treatment:                                                DATE: 02/23/22 ?Therapeutic Exercise: ?Nustep level 1 x 8 mins  ?FAQs with ball squeeze 15x2 ?Seated abd YTB 2x15 ?Seated hamstring curls YTB 2x15 ?Seated hamstring stretch on stool, 30s x2 B ?Tandem stand with foot on 4" step with OH reach 10/10 ?Alternating march against wall 10/10 ?Heel raises 15x with UE support ?Toe raises 15x with UE support ?Stand on Airex 30s x2 EO no UE support ? ?[facility] Adult PT Treatment:                                                DATE: 02/16/22 ?Therapeutic Exercise: ?Nustep level 2 x 8 mins  ?Slant board B 30s, SL 30s each ?Heel cord stretch 30s x2 against wall for HEP ?FAQs with ball squeeze 15x2 ?Seated abd YTB 2x15 ?Seated hamstring curls  YTB 2x15 ?Tandem stand with foot on 4" step with OH reach 10/10 ?Alternating march against wall 10/10 ? ?[facility]:                                                DATE: 02/12/22 ?Therapeutic

## 2022-02-26 ENCOUNTER — Ambulatory Visit: Payer: Medicare Other

## 2022-02-26 DIAGNOSIS — M6281 Muscle weakness (generalized): Secondary | ICD-10-CM

## 2022-02-26 DIAGNOSIS — R2681 Unsteadiness on feet: Secondary | ICD-10-CM

## 2022-02-26 NOTE — Therapy (Signed)
?OUTPATIENT PHYSICAL THERAPY TREATMENT NOTE ? ? ?Patient Name: Ashley Pacheco ?MRN: [id number] ?DOB:19-Nov-1945, 76 y.o., female ?Today's Date: 02/26/2022 ? ?PCP: Aretta Nip, MD ?REFERRING PROVIDER: Aretta Nip, MD ? ?END OF SESSION:  ? PT End of Session - 02/26/22 0915   ? ? Visit Number 6   ? Number of Visits 8   ? Date for PT Re-Evaluation 03/12/22   ? Authorization Type MCR/MCD   ? Progress Note Due on Visit 8   ? PT Start Time 4174   ? PT Stop Time 0955   ? PT Time Calculation (min) 40 min   ? Activity Tolerance Patient tolerated treatment well;Patient limited by fatigue   ? Behavior During Therapy [facility] for tasks assessed/performed   ? ?  ?  ? ?  ? ? ? ? ?Past Medical History:  ?Diagnosis Date  ? Aortic stenosis 05/18/2017  ? 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 Atrial fibrillation ([place])   ? Atrial fibrillation with rapid ventricular response ([place]) 02/14/2017  ? Atrial fibrillation with RVR ([place]) 11/15/2017  ? Atypical chest pain   ? a. 2015 Neg MV.  ? Cardiomegaly 2005  ? Cardiomyopathy ([place])   ? a. 01/2017 Echo: EF 45%, diff HK, mild LVH, triv AI, mild MR, mod dil LA, mildly dil RA, PASP 3mHg - in setting of Afib.  ? DVT (deep venous thrombosis) ([place] 02/2004  ? Fibroid   ? Hypercholesteremia   ? Hypertension   ? Menometrorrhagia 3/92  ? menopausal  ? Microhematuria 1/96  ? Mitral regurgitation 05/18/2017  ? Nephrolithiasis 2015  ? PAF (paroxysmal atrial fibrillation) ([place]   ? a. 01/2017 TEE/DCCV: EF 45-50%, no LA/LAA thrombus-->successful DCCV @ 150 J. CHA2DS2VASc = 3-->Xarelto.  ? Seizure disorder ([place] 02/17/2017  ? Seizures ([place]   ? [facility] ? SUI (stress urinary incontinence, female) 9/91  ? Vulvar cyst 7/90  ? ?Past Surgical History:  ?Procedure Laterality Date  ? APPENDECTOMY  1950  ? BUNIONECTOMY  7/11  ? second toe  ? CARDIOVERSION N/A 02/17/2017  ? Procedure: CARDIOVERSION;  Surgeon: TSkeet Latch MD;  [facility]  Service: Cardiovascular;   Laterality: N/A;  ? cataracts  3/09 4/09  ? DILATION AND CURETTAGE OF UTERUS    ? hystreroscopy/imcomplete polypectomy  ? KNEE SURGERY  10/13 or 11/13  ? right knee arthroscopy  ? TEE WITHOUT CARDIOVERSION N/A 02/17/2017  ? Procedure: TRANSESOPHAGEAL ECHOCARDIOGRAM (TEE);  Surgeon: TSkeet Latch MD;  [place]  Service: Cardiovascular;  Laterality: N/A;  ? [place] ? TUBAL LIGATION  1975  ? BTL  ? ?Patient Active Problem List  ? Diagnosis Date Noted  ? Falls 01/28/2022  ? Hypercoagulable state due to atrial fibrillation ([place] 01/28/2022  ? Disequilibrium 01/28/2022  ? Mitral regurgitation 05/18/2017  ? Aortic stenosis 05/18/2017  ? Seizure disorder ([place] 02/17/2017  ? Persistent atrial fibrillation ([place] 02/14/2017  ? Hyperlipidemia 10/02/2008  ? Morbid obesity ([place] 10/02/2008  ? Essential hypertension 10/02/2008  ? DVT 10/02/2008  ? ? ?REFERRING DIAG: R29.6 (ICD-10-CM) - Repeated falls  ? ?THERAPY DIAG:  ?Unsteadiness on feet ? ?Muscle weakness (generalized) ? ?PERTINENT HISTORY: In November 2022, she had a fall out of the bathtub and suffered a compression fracture from falling. Since NWilla Roughhas fallen 2 more times. Also she struggles with her balance, and uses her cane while walking. She has been scheduled for PT to work on this.  ? ?  PRECAUTIONS: Fall ? ?ONSET DATE: 01/14/2022  ? ?SUBJECTIVE: B knee pain less today, closer to baseline level, feels change in weather may be cause ? ?PAIN:  ?Are you having pain? Yes:  ?NPRS scale: 2/10 currently, 8/10 last week ?Pain location: Lower t-spine R side ?Pain description: ache ?Aggravating factors: activity and position changes ?Relieving factors: rest and meds ? ? ? ?OBJECTIVE:  ?  ?DIAGNOSTIC FINDINGS:  ?None noted ?  ?PATIENT SURVEYS:  ?FOTO 78 ?  ?SCREENING FOR RED FLAGS: ?Bowel or bladder incontinence: No ?Spinal tumors: No ?Cauda equina syndrome: No ?Compression fracture: Yes: old ?Abdominal aneurysm: No ?   ?COGNITION: ?          Overall cognitive status:  word finding problems                       ?           ?SENSATION: ?Not tested ?  ?MUSCLE LENGTH: ?  ?  ?POSTURE:  ?Flexed posture at hips ?  ?PALPATION: ?Not tested ?  ?LUMBAR ROM: Deferred due to time constraints ?  ?Active  A/PROM  ?02/05/2022  ?Flexion    ?Extension    ?Right lateral flexion    ?Left lateral flexion    ?Right rotation    ?Left rotation    ? (Blank rows = not tested) ?  ?LE ROM: ?  ?Passive  Right ?02/05/2022 Left ?02/05/2022  ?Hip flexion      ?Hip extension      ?Hip abduction      ?Hip adduction      ?Hip internal rotation      ?Hip external rotation      ?Knee flexion [facility] WFL  ?Knee extension WNL WNL  ?Ankle dorsiflexion 8 6  ?Ankle plantarflexion      ?Ankle inversion      ?Ankle eversion      ? (Blank rows = not tested) ?  ?LE MMT: ?  ?MMT Right ?02/05/2022 Left ?02/05/2022  ?Hip flexion 3+ 3+  ?Hip extension 3+ 3+  ?Hip abduction      ?Hip adduction      ?Hip internal rotation      ?Hip external rotation      ?Knee flexion 3+ 3+  ?Knee extension 3+ 3+  ?Ankle dorsiflexion 3+ 3+  ?Ankle plantarflexion 4 4  ?Ankle inversion      ?Ankle eversion      ? (Blank rows = not tested) ?  ?  ?  ?FUNCTIONAL TESTS:  ?5 times sit to stand: 22s ?  ?  02/05/22 0001  ?Berg Balance Test  ?Sit to Stand 3  ?Standing Unsupported 4  ?Sitting with Back Unsupported but Feet Supported on Floor or Stool 4  ?Stand to Sit 4  ?Transfers 4  ?Standing Unsupported with Eyes Closed 4  ?Standing Unsupported with Feet Together 4  ?From Standing, Reach Forward with Outstretched Arm 4  ?From Standing Position, Pick up Object from Floor 4  ?From Standing Position, Turn to Look Behind Over each Shoulder 4  ?Turn 360 Degrees 2  ?Standing Unsupported, Alternately Place Feet on Step/Stool 2  ?Standing Unsupported, One Foot in Front 2  ?Standing on One Leg 1  ?Total Score 46  ?  ?  ?GAIT: ?Distance walked: 59f x2 ?Assistive device utilized: Single point cane ?Level of assistance:  Modified independence ?Comments: slow cadence, wall surfs at times ?  ? DGI ?Mild impairment: walks 20', uses assistive devices, slower speed, mild  gait deviations. (2) ?Moderate impairment: Makes only minor adjustments to walking speed, or accomplishes a change in speed with significant gait deviations, or changes speed but loses balance but is able to recover and continue walking. (1) ?Mild impairment: Performs head turns smoothly with slight change in gait velocity, i.e. minor disruption to smooth gait path or uses walking aid. (2) ?Mild impairment: Performs head turns smoothly with slight change in gait velocity, i.e. minor disruption to smooth gait path or uses walking aid. (2) ?Mild impairment: pivot turns safely in >3 seconds and stops with no loss of balance. (2) ?Mild impairment: Is able to step over shoe box, but must slow down and adjust steps to clear box safely. (2) ?Mild impairment: Is able to step around both cones, but must slow down and adjust steps to clear cones. (2) ?Moderate impairment: Two feet to a stair, must use rail. (1) ?Total Score: 13/24 ?Scores below 19 suggest that the subject assessed has a higher risk of prospective falls ? ?  ?TODAY'S TREATMENT  ?[facility] Adult PT Treatment:                                                DATE: 02/26/22 ?Therapeutic Exercise: ?Nustep level 1 x 8 mins  ?B gastroc stretch 30s x2 ?Heel raises 15x ?Toe raise 15x ? ? 02/26/22 0001  ?Dynamic Gait Index  ?Level Surface 2  ?Change in Gait Speed 1  ?Gait with Horizontal Head Turns 2  ?Gait with Vertical Head Turns 2  ?Gait and Pivot Turn 2  ?Step Over Obstacle 2  ?Step Around Obstacles 2  ?Steps 1  ?Total Score 14  ? ? ?[facility] Adult PT Treatment:                                                DATE: 02/23/22 ?Therapeutic Exercise: ?Nustep level 1 x 8 mins  ?FAQs with ball squeeze 15x2 ?Seated abd YTB 2x15 ?Seated hamstring curls YTB 2x15 ?Seated hamstring stretch on stool, 30s x2 B ?Tandem stand with foot on 4" step  with OH reach 10/10 ?Alternating march against wall 10/10 ?Heel raises 15x with UE support ?Toe raises 15x with UE support ?Stand on Airex 30s x2 EO no UE support ? ?[facility] Adult PT Treatment:

## 2022-03-02 ENCOUNTER — Ambulatory Visit: Payer: Medicare Other

## 2022-03-02 DIAGNOSIS — R2681 Unsteadiness on feet: Secondary | ICD-10-CM

## 2022-03-02 DIAGNOSIS — M6281 Muscle weakness (generalized): Secondary | ICD-10-CM

## 2022-03-02 NOTE — Therapy (Signed)
?OUTPATIENT PHYSICAL THERAPY TREATMENT NOTE ? ? ?Patient Name: Ashley Pacheco ?MRN: [id number] ?DOB:[birth year], 76 y.o., female ?Today's Date: 03/02/2022 ? ?PCP: Aretta Nip, MD ?REFERRING PROVIDER: Aretta Nip, MD ? ?END OF SESSION:  ? PT End of Session - 03/02/22 1403   ? ? Visit Number 7   ? Number of Visits 8   ? Date for PT Re-Evaluation 03/12/22   ? Authorization Type MCR/MCD   ? Progress Note Due on Visit 8   ? PT Start Time 1400   ? PT Stop Time 1440   ? PT Time Calculation (min) 40 min   ? Activity Tolerance Patient tolerated treatment well;Patient limited by fatigue   ? Behavior During Therapy [facility] for tasks assessed/performed   ? ?  ?  ? ?  ? ? ? ? ?Past Medical History:  ?Diagnosis Date  ? Aortic stenosis 05/18/2017  ? 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 Atrial fibrillation ([place])   ? Atrial fibrillation with rapid ventricular response ([place]) 02/14/2017  ? Atrial fibrillation with RVR ([place]) 11/15/2017  ? Atypical chest pain   ? a. 2015 Neg MV.  ? Cardiomegaly 2005  ? Cardiomyopathy ([place])   ? a. 01/2017 Echo: EF 45%, diff HK, mild LVH, triv AI, mild MR, mod dil LA, mildly dil RA, PASP 19mHg - in setting of Afib.  ? DVT (deep venous thrombosis) ([place] 02/2004  ? Fibroid   ? Hypercholesteremia   ? Hypertension   ? Menometrorrhagia 3/92  ? menopausal  ? Microhematuria 1/96  ? Mitral regurgitation 05/18/2017  ? Nephrolithiasis 2015  ? PAF (paroxysmal atrial fibrillation) ([place]   ? a. 01/2017 TEE/DCCV: EF 45-50%, no LA/LAA thrombus-->successful DCCV @ 150 J. CHA2DS2VASc = 3-->Xarelto.  ? Seizure disorder ([place] 02/17/2017  ? Seizures ([place]   [facility] ? SUI (stress urinary incontinence, female) 9/91  ? Vulvar cyst 7/90  ? ?Past Surgical History:  ?Procedure Laterality Date  ? APPENDECTOMY  1950  ? BUNIONECTOMY  7/11  ? second toe  ? CARDIOVERSION N/A 02/17/2017  ? Procedure: CARDIOVERSION;  Surgeon: TSkeet Latch MD;  [facility]  Service: Cardiovascular;   Laterality: N/A;  ? cataracts  3/09 4/09  ? DILATION AND CURETTAGE OF UTERUS    ? hystreroscopy/imcomplete polypectomy  ? KNEE SURGERY  10/13 or 11/13  ? right knee arthroscopy  ? TEE WITHOUT CARDIOVERSION N/A 02/17/2017  ? Procedure: TRANSESOPHAGEAL ECHOCARDIOGRAM (TEE);  Surgeon: TSkeet Latch MD;  Location[place]  Service: Cardiovascular;  Laterality: N/A;  ? [place] ? TUBAL LIGATION  1975  ? BTL  ? ?Patient Active Problem List  ? Diagnosis Date Noted  ? Falls 01/28/2022  ? Hypercoagulable state due to atrial fibrillation ([place] 01/28/2022  ? Disequilibrium 01/28/2022  ? Mitral regurgitation 05/18/2017  ? Aortic stenosis 05/18/2017  ? Seizure disorder ([place] 02/17/2017  ? Persistent atrial fibrillation ([place] 02/14/2017  ? Hyperlipidemia 10/02/2008  ? Morbid obesity ([place] 10/02/2008  ? Essential hypertension 10/02/2008  ? DVT 10/02/2008  ? ? ?REFERRING DIAG: R29.6 (ICD-10-CM) - Repeated falls  ? ?THERAPY DIAG:  ?Unsteadiness on feet ? ?Muscle weakness (generalized) ? ?PERTINENT HISTORY: In November 2022, she had a fall out of the bathtub and suffered a compression fracture from falling. Since NWilla Roughhas fallen 2 more times. Also she struggles with her balance, and uses her cane while walking. She has been scheduled for PT to work on this.  ? ?  PRECAUTIONS: Fall ? ?ONSET DATE: 01/14/2022  ? ?SUBJECTIVE: Knee pain reported today rated at 2/10(?), but causing patient to struggle when arising from seated posiiton, minimal to no resting pain.  Has not seen ortho MD in a number of years. ? ?PAIN:  ?Are you having pain? Yes:  ?NPRS scale: 2/10 currently, 8/10 last week ?Pain location: Lower t-spine R side ?Pain description: ache ?Aggravating factors: activity and position changes ?Relieving factors: rest and meds ? ? ? ?OBJECTIVE:  ?  ?DIAGNOSTIC FINDINGS:  ?None noted ?  ?PATIENT SURVEYS:  ?FOTO 88 ?  ?SCREENING FOR RED FLAGS: ?Bowel or bladder incontinence: No ?Spinal  tumors: No ?Cauda equina syndrome: No ?Compression fracture: Yes: old ?Abdominal aneurysm: No ?  ?COGNITION: ?          Overall cognitive status:  word finding problems                       ?           ?SENSATION: ?Not tested ?  ?MUSCLE LENGTH: ?  ?  ?POSTURE:  ?Flexed posture at hips ?  ?PALPATION: ?Not tested ?  ?LUMBAR ROM: Deferred due to time constraints ?  ?Active  A/PROM  ?02/05/2022  ?Flexion    ?Extension    ?Right lateral flexion    ?Left lateral flexion    ?Right rotation    ?Left rotation    ? (Blank rows = not tested) ?  ?LE ROM: ?  ?Passive  Right ?02/05/2022 Left ?02/05/2022  ?Hip flexion      ?Hip extension      ?Hip abduction      ?Hip adduction      ?Hip internal rotation      ?Hip external rotation      ?Knee flexion [facility] WFL  ?Knee extension WNL WNL  ?Ankle dorsiflexion 8 6  ?Ankle plantarflexion      ?Ankle inversion      ?Ankle eversion      ? (Blank rows = not tested) ?  ?LE MMT: ?  ?MMT Right ?02/05/2022 Left ?02/05/2022  ?Hip flexion 3+ 3+  ?Hip extension 3+ 3+  ?Hip abduction      ?Hip adduction      ?Hip internal rotation      ?Hip external rotation      ?Knee flexion 3+ 3+  ?Knee extension 3+ 3+  ?Ankle dorsiflexion 3+ 3+  ?Ankle plantarflexion 4 4  ?Ankle inversion      ?Ankle eversion      ? (Blank rows = not tested) ?  ?  ?  ?FUNCTIONAL TESTS:  ?5 times sit to stand: 22s ?  ?  02/05/22 0001  ?Berg Balance Test  ?Sit to Stand 3  ?Standing Unsupported 4  ?Sitting with Back Unsupported but Feet Supported on Floor or Stool 4  ?Stand to Sit 4  ?Transfers 4  ?Standing Unsupported with Eyes Closed 4  ?Standing Unsupported with Feet Together 4  ?From Standing, Reach Forward with Outstretched Arm 4  ?From Standing Position, Pick up Object from Floor 4  ?From Standing Position, Turn to Look Behind Over each Shoulder 4  ?Turn 360 Degrees 2  ?Standing Unsupported, Alternately Place Feet on Step/Stool 2  ?Standing Unsupported, One Foot in Front 2  ?Standing on One Leg 1  ?Total Score 46  ?  ?   ?GAIT: ?Distance walked: 3f x2 ?Assistive device utilized: Single point cane ?Level of assistance: Modified independence ?Comments: slow cadence, wall  surfs at times ?  ? DGI ?Mild impairment: walks 20', uses assistive devices, slower speed, mild gait deviations. (2) ?Moderate impairment: Makes only minor adjustments to walking speed, or accomplishes a change in speed with significant gait deviations, or changes speed but loses balance but is able to recover and continue walking. (1) ?Mild impairment: Performs head turns smoothly with slight change in gait velocity, i.e. minor disruption to smooth gait path or uses walking aid. (2) ?Mild impairment: Performs head turns smoothly with slight change in gait velocity, i.e. minor disruption to smooth gait path or uses walking aid. (2) ?Mild impairment: pivot turns safely in >3 seconds and stops with no loss of balance. (2) ?Mild impairment: Is able to step over shoe box, but must slow down and adjust steps to clear box safely. (2) ?Mild impairment: Is able to step around both cones, but must slow down and adjust steps to clear cones. (2) ?Moderate impairment: Two feet to a stair, must use rail. (1) ?Total Score: 13/24 ?Scores below 19 suggest that the subject assessed has a higher risk of prospective falls ? ?  ?TODAY'S TREATMENT  ?[facility] Adult PT Treatment:                                                DATE: 03/02/22 ?Therapeutic Exercise: ?Nustep level 2 x 8 mins  ? ? 03/02/22 0001  ?Berg Balance Test  ?Sit to Stand 3  ?Standing Unsupported 4  ?Sitting with Back Unsupported but Feet Supported on Floor or Stool 4  ?Stand to Sit 4  ?Transfers 3  ?Standing Unsupported with Eyes Closed 4  ?Standing Unsupported with Feet Together 4  ?From Standing, Reach Forward with Outstretched Arm 3  ?From Standing Position, Pick up Object from Floor 4  ?From Standing Position, Turn to Look Behind Over each Shoulder 4  ?Turn 360 Degrees 1  ?Standing Unsupported, Alternately Place Feet on  Step/Stool 3  ?Standing Unsupported, One Foot in Front 3  ?Standing on One Leg 0  ?Total Score 44  ? ?Heel cord stretch 30s x2 against wall for HEP ?FAQs with ball squeeze 15x2 ?Seated abd RTB 2x15 ?Seated hamstring curls

## 2022-03-05 ENCOUNTER — Ambulatory Visit: Payer: Medicare Other

## 2022-03-05 DIAGNOSIS — R2681 Unsteadiness on feet: Secondary | ICD-10-CM

## 2022-03-05 DIAGNOSIS — M6281 Muscle weakness (generalized): Secondary | ICD-10-CM | POA: Diagnosis not present

## 2022-03-05 NOTE — Therapy (Signed)
?OUTPATIENT PHYSICAL THERAPY TREATMENT NOTE ? ? ?Patient Name: Ashley Pacheco ?MRN: [id number] ?DOB:December 22, 1945, 76 y.o., female ?Today's Date: 03/05/2022 ? ?PCP: Aretta Nip, MD ?REFERRING PROVIDER: Aretta Nip, MD ? ?END OF SESSION:  ? PT End of Session - 03/05/22 0920   ? ? Visit Number 8   ? Number of Visits 8   ? Date for PT Re-Evaluation 03/12/22   ? Authorization Type MCR/MCD   ? Progress Note Due on Visit 8   ? PT Start Time 3149   ? PT Stop Time 0955   ? PT Time Calculation (min) 40 min   ? Activity Tolerance Patient tolerated treatment well;Patient limited by fatigue   ? Behavior During Therapy [facility] for tasks assessed/performed   ? ?  ?  ? ?  ? ? ? ? ?Past Medical History:  ?Diagnosis Date  ? Aortic stenosis 05/18/2017  ? 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 Atrial fibrillation ([place])   ? Atrial fibrillation with rapid ventricular response ([place]) 02/14/2017  ? Atrial fibrillation with RVR ([place]) 11/15/2017  ? Atypical chest pain   ? a. 2015 Neg MV.  ? Cardiomegaly 2005  ? Cardiomyopathy ([place])   ? a. 01/2017 Echo: EF 45%, diff HK, mild LVH, triv AI, mild MR, mod dil LA, mildly dil RA, PASP 94mHg - in setting of Afib.  ? DVT (deep venous thrombosis) ([place] 02/2004  ? Fibroid   ? Hypercholesteremia   ? Hypertension   ? Menometrorrhagia 3/92  ? menopausal  ? Microhematuria 1/96  ? Mitral regurgitation 05/18/2017  ? Nephrolithiasis 2015  ? PAF (paroxysmal atrial fibrillation) ([place]   ? a. 01/2017 TEE/DCCV: EF 45-50%, no LA/LAA thrombus-->successful DCCV @ 150 J. CHA2DS2VASc = 3-->Xarelto.  ? Seizure disorder ([place] 02/17/2017  ? Seizures ([place]   ? [facility] ? SUI (stress urinary incontinence, female) 9/91  ? Vulvar cyst 7/90  ? ?Past Surgical History:  ?Procedure Laterality Date  ? APPENDECTOMY  1950  ? BUNIONECTOMY  7/11  ? second toe  ? CARDIOVERSION N/A 02/17/2017  ? Procedure: CARDIOVERSION;  Surgeon: TSkeet Latch MD;  [facility]  Service: Cardiovascular;   Laterality: N/A;  ? cataracts  3/09 4/09  ? DILATION AND CURETTAGE OF UTERUS    ? hystreroscopy/imcomplete polypectomy  ? KNEE SURGERY  10/13 or 11/13  ? right knee arthroscopy  ? TEE WITHOUT CARDIOVERSION N/A 02/17/2017  ? Procedure: TRANSESOPHAGEAL ECHOCARDIOGRAM (TEE);  Surgeon: TSkeet Latch MD;  [place]  Service: Cardiovascular;  Laterality: N/A;  ? [place] ? TUBAL LIGATION  1975  ? BTL  ? ?Patient Active Problem List  ? Diagnosis Date Noted  ? Falls 01/28/2022  ? Hypercoagulable state due to atrial fibrillation ([place] 01/28/2022  ? Disequilibrium 01/28/2022  ? Mitral regurgitation 05/18/2017  ? Aortic stenosis 05/18/2017  ? Seizure disorder ([place] 02/17/2017  ? Persistent atrial fibrillation ([place] 02/14/2017  ? Hyperlipidemia 10/02/2008  ? Morbid obesity ([place] 10/02/2008  ? Essential hypertension 10/02/2008  ? DVT 10/02/2008  ? ? ?REFERRING DIAG: R29.6 (ICD-10-CM) - Repeated falls  ? ?THERAPY DIAG:  ?No diagnosis found. ? ?PERTINENT HISTORY: In November 2022, she had a fall out of the bathtub and suffered a compression fracture from falling. Since NWilla Roughhas fallen 2 more times. Also she struggles with her balance, and uses her cane while walking. She has been scheduled for PT to work on this.  ? ?PRECAUTIONS: Fall ? ?ONSET  DATE: 01/14/2022  ? ?SUBJECTIVE: Knee pain remains 2/10 but developed a fluid build up as of today, denies SOB, has not weighed herself and is wearing here compression hose today.  Will call her cardiologist today to address fliud gains ? ?PAIN:  ?Are you having pain? Yes:  ?NPRS scale: 2/10 currently, 8/10 last week ?Pain location: Lower t-spine R side ?Pain description: ache ?Aggravating factors: activity and position changes ?Relieving factors: rest and meds ? ? ? ?OBJECTIVE:  ?  ?DIAGNOSTIC FINDINGS:  ?None noted ?  ?PATIENT SURVEYS:  ?FOTO 12 ?  ?SCREENING FOR RED FLAGS: ?Bowel or bladder incontinence: No ?Spinal tumors: No ?Cauda  equina syndrome: No ?Compression fracture: Yes: old ?Abdominal aneurysm: No ?  ?COGNITION: ?          Overall cognitive status:  word finding problems                       ?           ?SENSATION: ?Not tested ?  ?MUSCLE LENGTH: ?  ?  ?POSTURE:  ?Flexed posture at hips ?  ?PALPATION: ?Not tested ?  ?LUMBAR ROM: Deferred due to time constraints ?  ?Active  A/PROM  ?02/05/2022  ?Flexion    ?Extension    ?Right lateral flexion    ?Left lateral flexion    ?Right rotation    ?Left rotation    ? (Blank rows = not tested) ?  ?LE ROM: ?  ?Passive  Right ?02/05/2022 Left ?02/05/2022  ?Hip flexion      ?Hip extension      ?Hip abduction      ?Hip adduction      ?Hip internal rotation      ?Hip external rotation      ?Knee flexion [facility] WFL  ?Knee extension WNL WNL  ?Ankle dorsiflexion 8 6  ?Ankle plantarflexion      ?Ankle inversion      ?Ankle eversion      ? (Blank rows = not tested) ?  ?LE MMT: ?  ?MMT Right ?02/05/2022 Left ?02/05/2022  ?Hip flexion 3+ 3+  ?Hip extension 3+ 3+  ?Hip abduction      ?Hip adduction      ?Hip internal rotation      ?Hip external rotation      ?Knee flexion 3+ 3+  ?Knee extension 3+ 3+  ?Ankle dorsiflexion 3+ 3+  ?Ankle plantarflexion 4 4  ?Ankle inversion      ?Ankle eversion      ? (Blank rows = not tested) ?  ?  ?  ?FUNCTIONAL TESTS:  ?5 times sit to stand: 22s ?  ?  02/05/22 0001  ?Berg Balance Test  ?Sit to Stand 3  ?Standing Unsupported 4  ?Sitting with Back Unsupported but Feet Supported on Floor or Stool 4  ?Stand to Sit 4  ?Transfers 4  ?Standing Unsupported with Eyes Closed 4  ?Standing Unsupported with Feet Together 4  ?From Standing, Reach Forward with Outstretched Arm 4  ?From Standing Position, Pick up Object from Floor 4  ?From Standing Position, Turn to Look Behind Over each Shoulder 4  ?Turn 360 Degrees 2  ?Standing Unsupported, Alternately Place Feet on Step/Stool 2  ?Standing Unsupported, One Foot in Front 2  ?Standing on One Leg 1  ?Total Score 46  ?  ?  ?GAIT: ?Distance walked:  43f x2 ?Assistive device utilized: Single point cane ?Level of assistance: Modified independence ?Comments: slow cadence, wall surfs  at times ?  ? DGI ?Mild impairment: walks 20', uses assistive devices, slower speed, mild gait deviations. (2) ?Moderate impairment: Makes only minor adjustments to walking speed, or accomplishes a change in speed with significant gait deviations, or changes speed but loses balance but is able to recover and continue walking. (1) ?Mild impairment: Performs head turns smoothly with slight change in gait velocity, i.e. minor disruption to smooth gait path or uses walking aid. (2) ?Mild impairment: Performs head turns smoothly with slight change in gait velocity, i.e. minor disruption to smooth gait path or uses walking aid. (2) ?Mild impairment: pivot turns safely in >3 seconds and stops with no loss of balance. (2) ?Mild impairment: Is able to step over shoe box, but must slow down and adjust steps to clear box safely. (2) ?Mild impairment: Is able to step around both cones, but must slow down and adjust steps to clear cones. (2) ?Moderate impairment: Two feet to a stair, must use rail. (1) ?Total Score: 13/24 ?Scores below 19 suggest that the subject assessed has a higher risk of prospective falls ? ?  ?TODAY'S TREATMENT  ?[facility] Adult PT Treatment:                                                DATE: 03/05/22 ?Therapeutic Exercise: ?Nustep level 2 x 8 mins  ?B gastroc stretch 30s x2 ?Heel raises 20x ?Toe raise 20x ?FAQs with ball squeeze 15x2 ?Seated abd RTB 2x15 ?Seated hamstring curls RTB 2x15 ? ?[facility] Adult PT Treatment:                                                DATE: 03/02/22 ?Therapeutic Exercise: ?Nustep level 2 x 8 mins  ? ? 03/02/22 0001  ?Berg Balance Test  ?Sit to Stand 3  ?Standing Unsupported 4  ?Sitting with Back Unsupported but Feet Supported on Floor or Stool 4  ?Stand to Sit 4  ?Transfers 3  ?Standing Unsupported with Eyes Closed 4  ?Standing Unsupported with Feet  Together 4  ?From Standing, Reach Forward with Outstretched Arm 3  ?From Standing Position, Pick up Object from Floor 4  ?From Standing Position, Turn to Look Behind Over each Shoulder 4  ?Turn 360 Degrees 1  ?Sta

## 2022-03-17 DIAGNOSIS — M170 Bilateral primary osteoarthritis of knee: Secondary | ICD-10-CM | POA: Diagnosis not present

## 2022-03-17 DIAGNOSIS — M1712 Unilateral primary osteoarthritis, left knee: Secondary | ICD-10-CM | POA: Diagnosis not present

## 2022-03-17 DIAGNOSIS — M1711 Unilateral primary osteoarthritis, right knee: Secondary | ICD-10-CM | POA: Diagnosis not present

## 2022-03-30 DIAGNOSIS — M1712 Unilateral primary osteoarthritis, left knee: Secondary | ICD-10-CM | POA: Diagnosis not present

## 2022-03-30 DIAGNOSIS — M170 Bilateral primary osteoarthritis of knee: Secondary | ICD-10-CM | POA: Diagnosis not present

## 2022-03-30 DIAGNOSIS — M1711 Unilateral primary osteoarthritis, right knee: Secondary | ICD-10-CM | POA: Diagnosis not present

## 2022-04-06 DIAGNOSIS — M170 Bilateral primary osteoarthritis of knee: Secondary | ICD-10-CM | POA: Diagnosis not present

## 2022-04-06 DIAGNOSIS — M1711 Unilateral primary osteoarthritis, right knee: Secondary | ICD-10-CM | POA: Diagnosis not present

## 2022-04-06 DIAGNOSIS — M1712 Unilateral primary osteoarthritis, left knee: Secondary | ICD-10-CM | POA: Diagnosis not present

## 2022-04-09 ENCOUNTER — Other Ambulatory Visit: Payer: Self-pay | Admitting: Cardiology

## 2022-04-09 DIAGNOSIS — I4891 Unspecified atrial fibrillation: Secondary | ICD-10-CM

## 2022-04-09 NOTE — Telephone Encounter (Signed)
Prescription refill request for Xarelto received.  Indication: afib  Last office visit: skains, 01/28/2022 Weight: 97.2 kg  Age: 77 yo  Scr:0.85, 01/12/2022 CrCl: 20m/min   Refill sent.

## 2022-06-08 NOTE — Patient Instructions (Incomplete)
Below is our plan:  We will continue levetiracetam 500mg  twice daily. Please continue working with PCP regarding falls and imbalance. Use your cane at all times. Consider a walker if needed. Consider revisit with PT and repeat MRI if needed.   Please make sure you are consistent with timing of seizure medication. I recommend annual visit with primary care provider (PCP) for complete physical and routine blood work. I recommend daily intake of vitamin D (400-800iu) and calcium (800-1000mg ) for bone health. Discuss Dexa screening with PCP.   According to [place] law, you can not drive unless you are seizure / syncope free for at least 6 months and under physician's care.  Please maintain precautions. Do not participate in activities where a loss of awareness could harm you or someone else. No swimming alone, no tub bathing, no hot tubs, no driving, no operating motorized vehicles (cars, ATVs, motocycles, etc), lawnmowers, power tools or firearms. No standing at heights, such as rooftops, ladders or stairs. Avoid hot objects such as stoves, heaters, open fires. Wear a helmet when riding a bicycle, scooter, skateboard, etc. and avoid areas of traffic. Set your water heater to 120 degrees or less.   Please make sure you are staying well hydrated. I recommend 50-60 ounces daily. Well balanced diet and regular exercise encouraged. Consistent sleep schedule with 6-8 hours recommended.   Please continue follow up with care team as directed.   Follow up with me in in 1 year   You may receive a survey regarding today's visit. I encourage you to leave honest feed back as I do use this information to improve patient care. Thank you for seeing me today!

## 2022-06-08 NOTE — Progress Notes (Unsigned)
PATIENT: Ashley Pacheco DOB: [birth year]  REASON FOR VISIT: follow up HISTORY FROM: patient  No chief complaint on file.   HISTORY OF PRESENT ILLNESS:  06/09/2022 ALL: Ashley Pacheco returns for follow up for seizures. She continues levetiracetam '500mg'$  BID.   06/03/2021 ALL:  Ashley Pacheco returns for follow up for seizures. She continues levetiracetam '500mg'$  BID. She denies recent seizure activity. She continues to have trouble with feeling lightheaded when she sits up in bed. She has had a couple of falls but was not dizzy or feeling lightheaded. She feels falls are when she doesn't have her cane. She is trying to use it all the time.   06/02/2020 ALL:  Ashley Pacheco is a 76 y.o. female here today for follow up for seizures. She continues to do well on levetiracetam '500mg'$  BID. Last seizure in 2017. She is tolerating medication well.   She reports concerns of being off balance. She feels that this has gradually worsened. She fell last August walking up steps. She reports that she has fallen 2-3 times since. She gets dizzy with position changes. She is not sure this has been a contributor to falls. She has a significant heart history. She is now using a single prong cane. No recent falls. She has mentioned this to her PCP. She has not considered PT. She is using a "balance oil" that she feels may be helping.   HISTORY: (copied from my note on 05/30/2019)  Ashley Pacheco is a 76 y.o. female here today for follow up of seizure. She continues levetiracetam '500mg'$  twice daily. She denies seizure activity. Last seizure in 2017. She reports that headaches have resolved. She rarely has one. She is doing well and without complaints.    HISTORY: (copied from Dr Gladstone Lighter note on 05/24/2018)   UPDATE (05/24/18, VRP): Since last visit, doing well. No seizures. However, now with new HA x 2-3 months, 2 per week, occipital. No other sxs. No similar HA in the past. Severity is mild. No alleviating or aggravating  factors. Tolerating levetiracetam.     UPDATE 04/01/17: Since last visit, was in [place] in April 2018, dx'd with afib and RVR, now on xarelto and diltiazem. Now doing well. No seizures. Tolerating LEV.    UPDATE 10/01/16: Since last visit, doing well. No sz. Tolerating LEV. No other abnl spells. She has some snoring, but no sig daytime fatigue. She is concerned about risk of stroke due to her family history.    PRIOR HPI (08/13/16): 76 year old right-handed female here for evaluation of possible seizure. Patient has history of hypertension, hyperglycemia depression and heart flutter. 08/10/16, patient was at home playing solitaire on her tablet. She was sitting in a lounge chair where she sometimes takes a nap in the afternoon. Around 2 PM patient noticed that this screen on the tablet was "wavy". Patient looked away from the tablet and saw an afterimage of the tablet follow her visual tracking to the right side. Patient then tried to use the TV remote to turn off the television. She tried pressing a few buttons was unable to turn off the TV. The next thing she remembered she was waking up, noticed that the TV remote was on the floor and the television was still on. She then noticed that her tongue and throat were very sore. She got up and looked in the mirror she found that she had severely bitten her tongue on both sides. No incontinence. No definite postictal confusion. She did not feel sore  in her arms or legs. In retrospect patient had a similar but minor event in spring of 2017 where she was using her tablet and saw a "wavy screen" lasting for approximately 1 minute. She did not have tongue biting or any other symptoms with that episode. Patient has normal worth and development mental history. No history of seizure in the past. No head trauma, meningitis or encephalitis in the past. No family history of seizure. Patient lives alone, is fairly active, takes care of all ADLs. No recent change in medication,  alcohol, sleep, stress or other events.     REVIEW OF SYSTEMS: Out of a complete 14 system review of symptoms, the patient complains only of the following symptoms, seizures, imbalance, falls and all other reviewed systems are negative.  ALLERGIES: Allergies  Allergen Reactions   Telmisartan     Other reaction(s): cough   Adhesive [Tape] Rash    HOME MEDICATIONS: Outpatient Medications Prior to Visit  Medication Sig Dispense Refill   Cholecalciferol (VITAMIN D) 2000 UNITS tablet Take 2,000 Units by mouth daily.      Cod Liver Oil 1000 MG CAPS Take by mouth.     digoxin (LANOXIN) 0.125 MG tablet Take 1 tablet (125 mcg total) by mouth daily. 90 tablet 3   diphenhydrAMINE (BENADRYL) 25 MG tablet Take 25 mg by mouth as needed (allergies).      Ferrous Gluconate (IRON 27 PO) Take by mouth daily.     furosemide (LASIX) 40 MG tablet TAKE 1 TABLET BY MOUTH EVERY DAY 90 tablet 2   HYDROcodone-acetaminophen (NORCO/VICODIN) 5-325 MG tablet Take 1 tablet by mouth every 6 (six) hours as needed for moderate pain.     levETIRAcetam (KEPPRA) 500 MG tablet Take 1 tablet (500 mg total) by mouth 2 (two) times daily. 180 tablet 4   loperamide (IMODIUM) 2 MG capsule Take 2 mg by mouth as needed for diarrhea or loose stools.     lovastatin (MEVACOR) 40 MG tablet Take 40 mg by mouth every morning.      metoprolol succinate (TOPROL-XL) 50 MG 24 hr tablet TAKE 1 TABLET BY MOUTH IN THE MORNING AND AT BEDTIME. PLEASE KEEP UPCOMING APPT IN DECEMBER 180 tablet 2   Multiple Vitamin (MULTI-VITAMIN DAILY PO) Take 1 tablet by mouth daily.      naproxen sodium (ALEVE) 220 MG tablet Take 220 mg by mouth daily as needed (rarely as needed for knee pain).     rivaroxaban (XARELTO) 20 MG TABS tablet TAKE 1 TABLET BY MOUTH EVERY DAY 90 tablet 1   sertraline (ZOLOFT) 100 MG tablet Take 100 mg by mouth daily.     traZODone (DESYREL) 150 MG tablet Take 150 mg by mouth at bedtime.      No facility-administered medications  prior to visit.    PAST MEDICAL HISTORY: Past Medical History:  Diagnosis Date   Aortic stenosis 05/18/2017   Arthritis    in knee   Atrial fibrillation (HCC)    Atrial fibrillation with rapid ventricular response ([place]) 02/14/2017   Atrial fibrillation with RVR ([place]) 11/15/2017   Atypical chest pain    a. 2015 Neg MV.   Cardiomegaly 2005   Cardiomyopathy [facility])    a. 01/2017 Echo: EF 45%, diff HK, mild LVH, triv AI, mild MR, mod dil LA, mildly dil RA, PASP 29mHg - in setting of Afib.   DVT (deep venous thrombosis) ([place] 02/2004   Fibroid    Hypercholesteremia    Hypertension    Menometrorrhagia 3/92  menopausal   Microhematuria 1/96   Mitral regurgitation 05/18/2017   Nephrolithiasis 2015   PAF (paroxysmal atrial fibrillation) ([place])    a. 01/2017 TEE/DCCV: EF 45-50%, no LA/LAA thrombus-->successful DCCV @ 150 J. CHA2DS2VASc = 3-->Xarelto.   Seizure disorder ([place]) 02/17/2017   Seizures ([place])    followed by [facility]- Dr. Leta Baptist   SUI (stress urinary incontinence, female) 9/91   Vulvar cyst 7/90    PAST SURGICAL HISTORY: Past Surgical History:  Procedure Laterality Date   APPENDECTOMY  1950   BUNIONECTOMY  7/11   second toe   CARDIOVERSION N/A 02/17/2017   Procedure: CARDIOVERSION;  Surgeon: Skeet Latch, MD;  Location: [place];  Service: Cardiovascular;  Laterality: N/A;   cataracts  3/09 4/09   DILATION AND CURETTAGE OF UTERUS     hystreroscopy/imcomplete polypectomy   KNEE SURGERY  10/13 or 11/13   right knee arthroscopy   TEE WITHOUT CARDIOVERSION N/A 02/17/2017   Procedure: TRANSESOPHAGEAL ECHOCARDIOGRAM (TEE);  Surgeon: Skeet Latch, MD;  Location[facility];  Service: Cardiovascular;  Laterality: N/A;   TONSILLECTOMY AND [place]   BTL    FAMILY HISTORY: Family History  Problem Relation Age of Onset   Cancer Father        stomach and prostate   Kidney Stones Father    Hypertension Mother     Asthma Mother    Kidney Stones Mother    Stroke Maternal Uncle    Heart attack Maternal Uncle    Hypertension Maternal Uncle    Cancer Paternal Uncle    Stroke Maternal Grandfather    Heart disease Sister        Rapid rhythm   Skin cancer Sister    Heart disease Brother        Rapid rhythm   Skin cancer Brother     SOCIAL HISTORY: Social History   Socioeconomic History   Marital status: Widowed    Spouse name: Not on file   Number of children: 2   Years of education: 48   Highest education level: Not on file  Occupational History    Comment: volunteer  Tobacco Use   Smoking status: Former    Types: Cigarettes   Smokeless tobacco: Never   Tobacco comments:    quit in 1969  Vaping Use   Vaping Use: Never used  Substance and Sexual Activity   Alcohol use: Yes    Alcohol/week: 7.0 standard drinks of alcohol    Types: 7 Glasses of wine per week   Drug use: No   Sexual activity: Not Currently    Partners: Male  Other Topics Concern   Not on file  Social History Narrative   Lives alone   Caffeine- coffee- 3 mugs daily   Social Determinants of Health   Financial Resource Strain: Not on file  Food Insecurity: Not on file  Transportation Needs: Not on file  Physical Activity: Not on file  Stress: Not on file  Social Connections: Not on file  Intimate Partner Violence: Not on file      PHYSICAL EXAM  There were no vitals filed for this visit.   There is no height or weight on file to calculate BMI.  Generalized: Well developed, in no acute distress  Cardiology: normal rate and rhythm, no murmur noted Respiratory: clear to auscultation bilaterally  Neurological examination  Mentation: Alert oriented to time, place, history taking. Follows all commands speech and language fluent Cranial nerve II-XII:  Pupils were equal round reactive to light. Extraocular movements were full, visual field were full on confrontational test. Facial sensation and strength were  normal. Head turning and shoulder shrug  were normal and symmetric. Motor: The motor testing reveals 4+ over 5 strength of all 4 extremities. Good symmetric motor tone is noted throughout.  Sensory: Sensory testing is intact to soft touch on all 4 extremities. No evidence of extinction is noted.  Coordination: Cerebellar testing reveals good finger-nose-finger and heel-to-shin bilaterally.  Gait and station: Gait is short but stable with single prong cane. She does not pick up her feet. Patient is wearing flip flops today. Tandem gait is unsteady. Romberg is negative. No drift is seen.  Reflexes: Deep tendon reflexes are symmetric and normal bilaterally.   DIAGNOSTIC DATA (LABS, IMAGING, TESTING) - I reviewed patient records, labs, notes, testing and imaging myself where available.      No data to display           Lab Results  Component Value Date   WBC 7.9 04/19/2018   HGB 13.4 04/19/2018   HCT 40.4 04/19/2018   MCV 82 04/19/2018   PLT 194 04/19/2018      Component Value Date/Time   NA 144 04/19/2018 1034   K 4.0 04/19/2018 1034   CL 104 04/19/2018 1034   CO2 27 04/19/2018 1034   GLUCOSE 108 (H) 04/19/2018 1034   GLUCOSE 106 (H) 11/15/2017 1725   BUN 10 04/19/2018 1034   CREATININE 0.88 04/19/2018 1034   CALCIUM 8.8 04/19/2018 1034   PROT 6.0 04/19/2018 1034   ALBUMIN 3.9 04/19/2018 1034   AST 14 04/19/2018 1034   ALT 13 04/19/2018 1034   ALKPHOS 83 04/19/2018 1034   BILITOT 0.4 04/19/2018 1034   GFRNONAA 66 04/19/2018 1034   GFRAA 76 04/19/2018 1034   Lab Results  Component Value Date   CHOL 166 04/19/2018   HDL 35 (L) 04/19/2018   LDLCALC 103 (H) 04/19/2018   TRIG 140 04/19/2018   CHOLHDL 4.7 (H) 04/19/2018   Lab Results  Component Value Date   HGBA1C 5.5 11/15/2017   No results found for: "VITAMINB12" Lab Results  Component Value Date   TSH 1.940 04/19/2018       ASSESSMENT AND PLAN 76 y.o. year old female  has a past medical history of Aortic  stenosis (05/18/2017), Arthritis, Atrial fibrillation ([place]), Atrial fibrillation with rapid ventricular response ([place]) (02/14/2017), Atrial fibrillation with RVR ([place]) (11/15/2017), Atypical chest pain, Cardiomegaly (2005), Cardiomyopathy ([place]), DVT (deep venous thrombosis) ([place]) (02/2004), Fibroid, Hypercholesteremia, Hypertension, Menometrorrhagia (3/92), Microhematuria (1/96), Mitral regurgitation (05/18/2017), Nephrolithiasis (2015), PAF (paroxysmal atrial fibrillation) ([place]), Seizure disorder ([place]) (02/17/2017), Seizures ([place]), SUI (stress urinary incontinence, female) (9/91), and Vulvar cyst (7/90). here with   No diagnosis found.    Kenni is doing well from a seizure standpoint. We will continue levetiracetam '500mg'$  twice daily. We have discussed her concerns of falls. I have advised that she continue discussion with PCP and consider PT if concerns continue. Supportive shoes recommended. She was encouraged to continue using her cane and fall precautions advised. Healthy lifestyle habits encouraged. She will follow up in 1 year, sooner if needed. She verbalizes understanding and agreement with this plan.    No orders of the defined types were placed in this encounter.     No orders of the defined types were placed in this encounter.      Debbora Presto, FNP-C 06/08/2022, 3:35 PM [facility] [address], [address]  [place], [place] [address] [phone]

## 2022-06-09 ENCOUNTER — Encounter: Payer: Self-pay | Admitting: Family Medicine

## 2022-06-09 ENCOUNTER — Ambulatory Visit (INDEPENDENT_AMBULATORY_CARE_PROVIDER_SITE_OTHER): Payer: Medicare Other | Admitting: Family Medicine

## 2022-06-09 VITALS — BP 130/69 | HR 73 | Ht 61.5 in | Wt 214.0 lb

## 2022-06-09 DIAGNOSIS — G40909 Epilepsy, unspecified, not intractable, without status epilepticus: Secondary | ICD-10-CM | POA: Diagnosis not present

## 2022-06-09 MED ORDER — LEVETIRACETAM 500 MG PO TABS: 500.0000 mg | ORAL_TABLET | Freq: Two times a day (BID) | ORAL | 4 refills | Status: DC

## 2022-08-10 DIAGNOSIS — Z961 Presence of intraocular lens: Secondary | ICD-10-CM | POA: Diagnosis not present

## 2022-08-17 DIAGNOSIS — E7800 Pure hypercholesterolemia, unspecified: Secondary | ICD-10-CM | POA: Diagnosis not present

## 2022-08-17 DIAGNOSIS — I4821 Permanent atrial fibrillation: Secondary | ICD-10-CM | POA: Diagnosis not present

## 2022-08-17 DIAGNOSIS — R0781 Pleurodynia: Secondary | ICD-10-CM | POA: Diagnosis not present

## 2022-08-17 DIAGNOSIS — M545 Low back pain, unspecified: Secondary | ICD-10-CM | POA: Diagnosis not present

## 2022-08-17 DIAGNOSIS — I5020 Unspecified systolic (congestive) heart failure: Secondary | ICD-10-CM | POA: Diagnosis not present

## 2022-08-17 DIAGNOSIS — E559 Vitamin D deficiency, unspecified: Secondary | ICD-10-CM | POA: Diagnosis not present

## 2022-08-17 DIAGNOSIS — Z7901 Long term (current) use of anticoagulants: Secondary | ICD-10-CM | POA: Diagnosis not present

## 2022-08-17 DIAGNOSIS — Z23 Encounter for immunization: Secondary | ICD-10-CM | POA: Diagnosis not present

## 2022-08-17 DIAGNOSIS — Z0000 Encounter for general adult medical examination without abnormal findings: Secondary | ICD-10-CM | POA: Diagnosis not present

## 2022-08-17 DIAGNOSIS — Z6841 Body Mass Index (BMI) 40.0 and over, adult: Secondary | ICD-10-CM | POA: Diagnosis not present

## 2022-08-17 DIAGNOSIS — R7303 Prediabetes: Secondary | ICD-10-CM | POA: Diagnosis not present

## 2022-08-17 DIAGNOSIS — I10 Essential (primary) hypertension: Secondary | ICD-10-CM | POA: Diagnosis not present

## 2022-08-29 DIAGNOSIS — Z23 Encounter for immunization: Secondary | ICD-10-CM | POA: Diagnosis not present

## 2022-10-20 DIAGNOSIS — L218 Other seborrheic dermatitis: Secondary | ICD-10-CM | POA: Diagnosis not present

## 2022-10-20 DIAGNOSIS — L728 Other follicular cysts of the skin and subcutaneous tissue: Secondary | ICD-10-CM | POA: Diagnosis not present

## 2022-10-20 DIAGNOSIS — L820 Inflamed seborrheic keratosis: Secondary | ICD-10-CM | POA: Diagnosis not present

## 2022-10-20 DIAGNOSIS — B078 Other viral warts: Secondary | ICD-10-CM | POA: Diagnosis not present

## 2022-12-11 ENCOUNTER — Other Ambulatory Visit: Payer: Self-pay | Admitting: Cardiology

## 2022-12-11 DIAGNOSIS — I4891 Unspecified atrial fibrillation: Secondary | ICD-10-CM

## 2022-12-13 NOTE — Telephone Encounter (Signed)
Xarelto 22m refill request received. Pt is 77years old, weight-97.1kg, Crea-0.89 on 08/17/22 via [facility] from [facility] last seen by Dr. SMarlou Porchon 01/28/22, Diagnosis-Afib, CrCl- 81.14 mL/min; Dose is appropriate based on dosing criteria. Will send in refill to requested pharmacy.

## 2023-02-08 DIAGNOSIS — I5020 Unspecified systolic (congestive) heart failure: Secondary | ICD-10-CM | POA: Diagnosis not present

## 2023-02-08 DIAGNOSIS — Z9989 Dependence on other enabling machines and devices: Secondary | ICD-10-CM | POA: Diagnosis not present

## 2023-02-08 DIAGNOSIS — E7800 Pure hypercholesterolemia, unspecified: Secondary | ICD-10-CM | POA: Diagnosis not present

## 2023-02-08 DIAGNOSIS — G40909 Epilepsy, unspecified, not intractable, without status epilepticus: Secondary | ICD-10-CM | POA: Diagnosis not present

## 2023-02-08 DIAGNOSIS — M545 Low back pain, unspecified: Secondary | ICD-10-CM | POA: Diagnosis not present

## 2023-02-08 DIAGNOSIS — I10 Essential (primary) hypertension: Secondary | ICD-10-CM | POA: Diagnosis not present

## 2023-02-08 DIAGNOSIS — I4821 Permanent atrial fibrillation: Secondary | ICD-10-CM | POA: Diagnosis not present

## 2023-02-08 DIAGNOSIS — Z7901 Long term (current) use of anticoagulants: Secondary | ICD-10-CM | POA: Diagnosis not present

## 2023-02-08 DIAGNOSIS — Z6841 Body Mass Index (BMI) 40.0 and over, adult: Secondary | ICD-10-CM | POA: Diagnosis not present

## 2023-03-22 ENCOUNTER — Ambulatory Visit: Payer: Medicare Other | Attending: Cardiology | Admitting: Cardiology

## 2023-03-22 ENCOUNTER — Encounter: Payer: Self-pay | Admitting: Cardiology

## 2023-03-22 VITALS — BP 100/70 | HR 80 | Ht 61.0 in | Wt 210.0 lb

## 2023-03-22 DIAGNOSIS — D6869 Other thrombophilia: Secondary | ICD-10-CM | POA: Diagnosis not present

## 2023-03-22 DIAGNOSIS — W19XXXS Unspecified fall, sequela: Secondary | ICD-10-CM | POA: Insufficient documentation

## 2023-03-22 DIAGNOSIS — I350 Nonrheumatic aortic (valve) stenosis: Secondary | ICD-10-CM | POA: Diagnosis not present

## 2023-03-22 DIAGNOSIS — I4811 Longstanding persistent atrial fibrillation: Secondary | ICD-10-CM

## 2023-03-22 DIAGNOSIS — I4819 Other persistent atrial fibrillation: Secondary | ICD-10-CM | POA: Diagnosis not present

## 2023-03-22 NOTE — Patient Instructions (Signed)
Medication Instructions:  The current medical regimen is effective;  continue present plan and medications.  *If you need a refill on your cardiac medications before your next appointment, please call your pharmacy*  Testing/Procedures: Your physician has requested that you have an echocardiogram. Echocardiography is a painless test that uses sound waves to create images of your heart. It provides your doctor with information about the size and shape of your heart and how well your heart's chambers and valves are working. This procedure takes approximately one hour. There are no restrictions for this procedure. Please do NOT wear cologne, perfume, aftershave, or lotions (deodorant is allowed). Please arrive 15 minutes prior to your appointment time.  Follow-Up: At [facility], you and your health needs are our priority.  As part of our continuing mission to provide you with exceptional heart care, we have created designated Provider Care Teams.  These Care Teams include your primary Cardiologist (physician) and Advanced Practice Providers (APPs -  Physician Assistants and Nurse Practitioners) who all work together to provide you with the care you need, when you need it.  We recommend signing up for the patient portal called "MyChart".  Sign up information is provided on this After Visit Summary.  MyChart is used to connect with patients for Virtual Visits (Telemedicine).  Patients are able to view lab/test results, encounter notes, upcoming appointments, etc.  Non-urgent messages can be sent to your provider as well.   To learn more about what you can do with MyChart, go to [email or web address].    Your next appointment:   1 year(s)  Provider:   Mark Skains, MD      

## 2023-03-22 NOTE — Progress Notes (Signed)
Cardiology Office Note:    Date:  03/22/2023   ID:  Ashley Pacheco, DOB 10-Mar-1946, MRN [id number]  PCP:  Clayborn Heron, MD  [facility] Cardiologist:  Donato Schultz, MD  [facility] Electrophysiologist:  None   Referring MD: Clayborn Heron, MD     History of Present Illness:    Ashley Pacheco is a 77 y.o. female here for the follow-up of persistent atrial fibrillation.  Back in 2019 found to be in atrial fibrillation with rate control EF 40 to 45%.  After adequate rate control, repeat cardioversion was not recommended since she was symptom-free.  Nuclear stress test back in 2019 showed no ischemia EF was 40 to 45% on echo but nuclear 34%.  Previously quite fatigued.  At last visit last year, rate was usually around 100.  She was on digoxin and metoprolol.  She had a couple falls, balance issues.  Ambulates with a cane.  No chest pain.   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 2015 Neg MV.   Cardiomegaly 2005   Cardiomyopathy [facility])    a. 01/2017 Echo: EF 45%, diff HK, mild LVH, triv AI, mild MR, mod dil LA, mildly dil RA, PASP - in setting of Afib.   DVT (deep venous thrombosis) (HCC) 02/2004   Fibroid    Hypercholesteremia    Hypertension    Menometrorrhagia 3/92   menopausal   Microhematuria 1/96   Mitral regurgitation 05/18/2017   Nephrolithiasis 2015   PAF (paroxysmal atrial fibrillation) (HCC)    a. 01/2017 TEE/DCCV: EF 45-50%, no LA/LAA thrombus-->successful DCCV @ 150 J. CHA2DS2VASc = 3-->Xarelto.   Seizure disorder (HCC) 02/17/2017   Seizures (HCC)    followed by [facility]- Dr. Marjory Lies   SUI (stress urinary incontinence, female) 9/91   Vulvar cyst 7/90    Past Surgical History:  Procedure Laterality  Date   APPENDECTOMY  1950   BUNIONECTOMY  7/11   second toe   CARDIOVERSION N/A 02/17/2017   Procedure: CARDIOVERSION;  Surgeon: Chilton Si, MD;  Location: [facility] ENDOSCOPY;  Service: Cardiovascular;  Laterality: N/A;   cataracts  3/09 4/09   DILATION AND CURETTAGE OF UTERUS     hystreroscopy/imcomplete polypectomy   KNEE SURGERY  10/13 or 11/13   right knee arthroscopy   TEE WITHOUT CARDIOVERSION N/A 02/17/2017   Procedure: TRANSESOPHAGEAL ECHOCARDIOGRAM (TEE);  Surgeon: Chilton Si, MD;  Location: [facility] ENDOSCOPY;  Service: Cardiovascular;  Laterality: N/A;   TONSILLECTOMY AND ADENOIDECTOMY  1950   TUBAL LIGATION  1975   BTL    Current Medications: Current Meds  Medication Sig   Cholecalciferol (VITAMIN D) 2000 UNITS tablet Take 2,000 Units by mouth daily.    Cod Liver Oil 1000 MG CAPS Take 1 capsule by mouth daily as needed.   digoxin (LANOXIN) 0.125 MG tablet Take 1 tablet (125 mcg total) by mouth daily.   diphenhydrAMINE (BENADRYL) 25 MG tablet Take 25 mg by mouth as needed (allergies).    Ferrous Gluconate (IRON 27 PO) Take by mouth as needed.   furosemide (LASIX) 40 MG tablet TAKE 1 TABLET BY MOUTH EVERY DAY   levETIRAcetam (KEPPRA) 500 MG tablet Take 1 tablet (500 mg total) by mouth 2 (two) times daily.  loperamide (IMODIUM) 2 MG capsule Take 2 mg by mouth as needed for diarrhea or loose stools.   lovastatin (MEVACOR) 40 MG tablet Take 40 mg by mouth every morning.    metoprolol succinate (TOPROL-XL) 50 MG 24 hr tablet TAKE 1 TABLET BY MOUTH IN THE MORNING AND AT BEDTIME. PLEASE KEEP UPCOMING APPT IN DECEMBER (Patient taking differently: TAKE 1 TABLET BY MOUTH IN THE MORNING AND AT BEDTIME.)   Multiple Vitamin (MULTI-VITAMIN DAILY PO) Take 1 tablet by mouth daily.    naproxen sodium (ALEVE) 220 MG tablet Take 220 mg by mouth daily as needed (rarely as needed for knee pain).   sertraline (ZOLOFT) 100 MG tablet Take 100 mg by mouth daily.   traZODone (DESYREL) 150 MG tablet  Take 150 mg by mouth at bedtime.    XARELTO 20 MG TABS tablet TAKE 1 TABLET BY MOUTH EVERY DAY     Allergies:   Adhesive [tape] and Telmisartan   Social History   Socioeconomic History   Marital status: Widowed    Spouse name: Not on file   Number of children: 2   Years of education: 29   Highest education level: Not on file  Occupational History    Comment: volunteer  Tobacco Use   Smoking status: Former    Types: Cigarettes   Smokeless tobacco: Never   Tobacco comments:    quit in 1969  Vaping Use   Vaping Use: Never used  Substance and Sexual Activity   Alcohol use: Yes    Alcohol/week: 7.0 standard drinks of alcohol    Types: 7 Glasses of wine per week   Drug use: No   Sexual activity: Not Currently    Partners: Male  Other Topics Concern   Not on file  Social History Narrative   Lives alone   Caffeine- coffee- 3 mugs daily   Social Determinants of Health   Financial Resource Strain: Not on file  Food Insecurity: Not on file  Transportation Needs: Not on file  Physical Activity: Not on file  Stress: Not on file  Social Connections: Not on file     Family History: The patient's family history includes Asthma in her mother; Cancer in her father and paternal uncle; Heart attack in her maternal uncle; Heart disease in her brother and sister; Hypertension in her maternal uncle and mother; Kidney Stones in her father and mother; Skin cancer in her brother and sister; Stroke in her maternal grandfather and maternal uncle.  ROS:   Please see the history of present illness. All other systems reviewed and are negative.  EKGs/Labs/Other Studies Reviewed:    Cardiac Studies & Procedures     STRESS TESTS  NM MYOCAR MULTI W/SPECT W 11/19/2017  Narrative CLINICAL DATA:  77 year old female with a history of chest pain.  Cardiovascular risk factors include smoking, hypertension, family history, hypercholesterolemia  EXAM: MYOCARDIAL IMAGING WITH SPECT (REST AND  PHARMACOLOGIC-STRESS)  GATED LEFT VENTRICULAR WALL MOTION STUDY  LEFT VENTRICULAR EJECTION FRACTION  TECHNIQUE: Standard myocardial SPECT imaging was performed after resting intravenous injection of 10 mCi Tc-55m tetrofosmin. Subsequently, intravenous infusion of Lexiscan was performed under the supervision of the Cardiology staff. At peak effect of the drug, 30 mCi Tc-91m tetrofosmin was injected intravenously and standard myocardial SPECT imaging was performed. Quantitative gated imaging was also performed to evaluate left ventricular wall motion, and estimate left ventricular ejection fraction.  COMPARISON:  None.  FINDINGS: Perfusion: No decreased activity in the left ventricle on stress imaging to suggest  reversible ischemia or infarction.  Wall Motion: Global hypokinesia.  Left Ventricular Ejection Fraction: 34 %  End diastolic volume 94 ml  End systolic volume 62 ml  IMPRESSION: 1. No reversible ischemia or infarction.  2. Global hypokinesia.  3. Left ventricular ejection fraction 34%  4. Non invasive risk stratification*: High  *2012 Appropriate Use Criteria for Coronary Revascularization Focused Update: J Am Coll Cardiol. 2012;59(9):857-881. http://content.dementiazones.com.aspx?articleid=1201161   Electronically Signed By: Gilmer Mor D.O. On: 11/19/2017 14:15   ECHOCARDIOGRAM  ECHOCARDIOGRAM COMPLETE 05/17/2018  Narrative *[facility]* [address] [place], [place] [address] [phone]  ------------------------------------------------------------------- Transthoracic Echocardiography  Patient:    Clorisa, Antunez MR #:       [id number] Study Date: 05/17/2018 Gender:     F Age:        71 Height:     156.2 cm Weight:     101.2 kg BSA:        2.15 m^2 Pt. Status: Room:  SONOGRAPHER  Vergia Alcon ATTENDING    Donato Schultz, M.D. ORDERING     Donato Schultz, M.D. REFERRING    Donato Schultz, M.D. PERFORMING   [facility],  Outpatient  cc:  ------------------------------------------------------------------- LV EF: 40% -   45%  ------------------------------------------------------------------- Indications:      (I34.0).  ------------------------------------------------------------------- History:   PMH:  Acquired from the patient and from the patient&'s chart.  Atrial fibrillation.  Mitral valve disease.  Aortic valve disease.  Risk factors:  Hypertension. Dyslipidemia.  ------------------------------------------------------------------- Study Conclusions  - Left ventricle: The cavity size was normal. There was moderate concentric hypertrophy. Systolic function was mildly to moderately reduced. The estimated ejection fraction was in the range of 40% to 45%. Global hypokinesis with more severe apical hypokinesis. The study is not technically sufficient to allow evaluation of LV diastolic function. - Aortic valve: Mildly calcified leaflets. Mild stenosis. Trivial regurgitation. Mean gradient (S): 6 mm Hg. - Mitral valve: Mildly thickened leaflets . There was trivial regurgitation. - Left atrium: Severely dilated. - Right ventricle: The cavity size was normal. Wall thickness was normal. Systolic function appears mildly decreased. - Right atrium: The atrium was mildly dilated. - Tricuspid valve: There was mild regurgitation. - Pulmonary arteries: PA peak pressure: 28 mm Hg (S). - Inferior vena cava: The vessel was normal in size. The respirophasic diameter changes were in the normal range (>= 50%), consistent with normal central venous pressure.  Impressions:  - Compared to a prior study in 04/2017, there are no significant changes. The mean aortic valve gradient is likely undersampled in this study.  ------------------------------------------------------------------- Study data:  Comparison was made to the study of 05/16/2017.  Study status:  Routine.  Procedure:  The patient reported no pain  pre or post test. Transthoracic echocardiography for left ventricular function evaluation and for assessment of valvular function. Image quality was adequate.  Study completion:  There were no complications.          Transthoracic echocardiography.  M-mode, complete 2D, spectral Doppler, and color Doppler.  Birthdate: Patient birthdate: 11-19-1945.  Age:  Patient is 77 yr old.  Sex: Gender: female.    BMI: 41.5 kg/m^2.  Blood pressure:     118/80 Patient status:  Outpatient.  Study date:  Study date: 05/17/2018. Study time: 03:30 PM.  Location:  [facility]  -------------------------------------------------------------------  ------------------------------------------------------------------- Left ventricle:  The cavity size was normal. There was moderate concentric hypertrophy. Systolic function was mildly to moderately reduced. The estimated ejection fraction was in the  range of 40% to 45%. The study is not technically sufficient to allow evaluation of LV diastolic function.  ------------------------------------------------------------------- Aortic valve:   Mildly calcified leaflets.  Doppler:  Mild stenosis. Trivial regurgitation.    VTI ratio of LVOT to aortic valve: 0.31. Valve area (VTI): 0.87 cm^2. Indexed valve area (VTI): 0.4 cm^2/m^2. Peak velocity ratio of LVOT to aortic valve: 0.36. Valve area (Vmax): 1.01 cm^2. Indexed valve area (Vmax): 0.47 cm^2/m^2. Mean velocity ratio of LVOT to aortic valve: 0.39. Valve area (Vmean): 1.1 cm^2. Indexed valve area (Vmean): 0.51 cm^2/m^2. Mean gradient (S): 6 mm Hg. Peak gradient (S): 15 mm Hg.  ------------------------------------------------------------------- Aorta:  Aortic root: The aortic root was normal in size. Ascending aorta: The ascending aorta was normal in size.  ------------------------------------------------------------------- Mitral valve:   Mildly thickened leaflets .  Doppler:  There was trivial  regurgitation.    Peak gradient (D): 5 mm Hg.  ------------------------------------------------------------------- Left atrium:  Severely dilated.  ------------------------------------------------------------------- Atrial septum:  No defect or patent foramen ovale was identified.  ------------------------------------------------------------------- Right ventricle:  The cavity size was normal. Wall thickness was normal. Systolic function appears mildly decreased.  ------------------------------------------------------------------- Pulmonic valve:    The valve appears to be grossly normal. Doppler:  There was no significant regurgitation.  ------------------------------------------------------------------- Tricuspid valve:   Doppler:  There was mild regurgitation.  ------------------------------------------------------------------- Pulmonary artery:   The main pulmonary artery was normal-sized.  ------------------------------------------------------------------- Right atrium:  The atrium was mildly dilated.  ------------------------------------------------------------------- Pericardium:  There was no pericardial effusion.  ------------------------------------------------------------------- Systemic veins: Inferior vena cava: The vessel was normal in size. The respirophasic diameter changes were in the normal range (>= 50%), consistent with normal central venous pressure.  ------------------------------------------------------------------- Measurements  Left ventricle                            Value          Reference LV ID, ED, PLAX chordal                   43.9  mm       43 - 52 LV ID, ES, PLAX chordal                   34.5  mm       23 - 38 LV fx shortening, PLAX chordal    (L)     21    %        >=29 LV PW thickness, ED                       13.9  mm       --------- IVS/LV PW ratio, ED                       0.99           <=1.3 Stroke volume, 2D                          29    ml       --------- Stroke volume/bsa, 2D                     13    ml/m^2   ---------  Ventricular septum  Value          Reference IVS thickness, ED                         13.7  mm       ---------  LVOT                                      Value          Reference LVOT ID, S                                19    mm       --------- LVOT area                                 2.84  cm^2     --------- LVOT ID                                   19    mm       --------- LVOT peak velocity, S                     69.9  cm/s     --------- LVOT mean velocity, S                     42.1  cm/s     --------- LVOT VTI, S                               10.3  cm       --------- LVOT peak gradient, S                     2     mm Hg    --------- Stroke volume (SV), LVOT DP               29.2  ml       --------- Stroke index (SV/bsa), LVOT DP            13.6  ml/m^2   ---------  Aortic valve                              Value          Reference Aortic valve peak velocity, S             196   cm/s     --------- Aortic valve mean velocity, S             109   cm/s     --------- Aortic valve VTI, S                       33.7  cm       --------- Aortic mean gradient, S                   6     mm Hg    --------- Aortic peak gradient, S  15    mm Hg    --------- VTI ratio, LVOT/AV                        0.31           --------- Aortic valve area, VTI                    0.87  cm^2     --------- Aortic valve area/bsa, VTI                0.4   cm^2/m^2 --------- Velocity ratio, peak, LVOT/AV             0.36           --------- Aortic valve area, peak velocity          1.01  cm^2     --------- Aortic valve area/bsa, peak               0.47  cm^2/m^2 --------- velocity Velocity ratio, mean, LVOT/AV             0.39           --------- Aortic valve area, mean velocity          1.1   cm^2     --------- Aortic valve area/bsa, mean               0.51  cm^2/m^2  --------- velocity  Aorta                                     Value          Reference Aortic root ID, ED                        34    mm       --------- Ascending aorta ID, A-P, S                34    mm       ---------  Left atrium                               Value          Reference LA ID, A-P, ES                            45    mm       --------- LA ID/bsa, A-P                            2.09  cm/m^2   <=2.2 LA volume, S                              100   ml       --------- LA volume/bsa, S                          46.5  ml/m^2   --------- LA volume, ES, 1-p A4C                    90    ml       ---------  LA volume/bsa, ES, 1-p A4C                41.8  ml/m^2   --------- LA volume, ES, 1-p A2C                    96    ml       --------- LA volume/bsa, ES, 1-p A2C                44.6  ml/m^2   ---------  Mitral valve                              Value          Reference Mitral E-wave peak velocity               115   cm/s     --------- Mitral deceleration time                  194   ms       150 - 230 Mitral peak gradient, D                   5     mm Hg    ---------  Pulmonary arteries                        Value          Reference PA pressure, S, DP                        28    mm Hg    <=30  Tricuspid valve                           Value          Reference Tricuspid regurg peak velocity            252   cm/s     --------- Tricuspid peak RV-RA gradient             25    mm Hg    ---------  Systemic veins                            Value          Reference Estimated CVP                             3     mm Hg    ---------  Right ventricle                           Value          Reference RV pressure, S, DP                        28    mm Hg    <=30  Legend: (L)  and  (H)  Talana Slatten values outside specified reference range.  ------------------------------------------------------------------- Prepared and Electronically Authenticated by  Zoila Shutter  MD 2019-07-24T16:11:18   TEE  ECHO TEE 02/18/2017  Narrative *[facility]* *[facility]* [address] [place], [place] [address] [phone]  ------------------------------------------------------------------- Transesophageal  Echocardiography with Cardioversion  Patient:    Laelynn, Deschaine MR #:       [id number] Study Date: 02/17/2017 Gender:     F Age:        35 Height:     154.9 cm Weight:     106.6 kg BSA:        2.21 m^2 Pt. Status: Room:       4NP07C  ATTENDING    Rollene Rotunda, MD SONOGRAPHER  Perley Jain, RDCS PERFORMING   Chilton Si, MD Aldean Ast, Janine  cc:  ------------------------------------------------------------------- LV EF: 35% -   40%  ------------------------------------------------------------------- Indications:      Atrial fibrillation - 427.31.  ------------------------------------------------------------------- History:   PMH:   Atrial fibrillation.  ------------------------------------------------------------------- Study Conclusions  - Left ventricle: The cavity size was normal. Wall thickness was normal. Systolic function was moderately reduced. The estimated ejection fraction was in the range of 35% to 40%. Diffuse hypokinesis. No evidence of thrombus. - Aortic valve: No evidence of vegetation. There was mild regurgitation. - Mitral valve: There was mild regurgitation. Partially torn chord involving the P3 segment. - Left atrium: No evidence of thrombus in the atrial cavity or appendage. No evidence of thrombus in the appendage. - Right atrium: No evidence of thrombus in the atrial cavity or appendage. - Atrial septum: No defect or patent foramen ovale was identified by color flow Doppler. - Pulmonic valve: No evidence of vegetation.  Impressions:  - Successful cardioversion. No cardiac source of emboli  was indentified.  ------------------------------------------------------------------- Study data:   Study status:  Routine.  Consent:  The risks, benefits, and alternatives to the procedure were explained to the patient and informed consent was obtained.  Procedure:  The patient reported no pain pre or post test. Initial setup. The patient was brought to the laboratory in the fasting state. A baseline ECG was recorded. Intravenous access was obtained. Surface ECG leads and pulse oximetric signals were monitored. Self-adhesive anterior-posterior defibrillation pads were applied. Sedation. Moderate sedation with intermittent deep sedation was administered during cardioversion by anesthesiology staff. Transesophageal echocardiography. An adult multiplane transesophageal probe was inserted by the attending cardiologistwithout difficulty. Image quality was adequate. No intracardiac thrombus was identified. Cardioversion. The rhythm was successfully converted from atrial fibrillation to normal sinus rhythm.  Study completion:  All IVs inserted during the procedure were removed. The patient tolerated the procedure well. There were no complications.  Administered medications:   Propofol.          Transesophageal echocardiography with cardioversion.  2D and intravenous contrast injection. Birthdate:  Patient birthdate: Aug 21, 1946.  Age:  Patient is 77 yr old.  Sex:  Gender: female.    BMI: 44.4 kg/m^2.  Blood pressure: 107/83  Patient status:  Outpatient.  Study date:  Study date: 02/17/2017. Study time: 11:23 AM.  Location:  Endoscopy.  -------------------------------------------------------------------  ------------------------------------------------------------------- Left ventricle:  The cavity size was normal. Wall thickness was normal. Systolic function was moderately reduced. The estimated ejection fraction was in the range of 35% to 40%. Diffuse hypokinesis.  No evidence of  thrombus.  ------------------------------------------------------------------- Aortic valve:   Structurally normal valve.   Cusp separation was normal.  No evidence of vegetation.  Doppler:  There was mild regurgitation.  ------------------------------------------------------------------- Aorta:  The aorta was not dilated and non-diseased. There was atheromatous plaque in the descending aorta.  ------------------------------------------------------------------- Mitral valve:   Doppler:  There was mild regurgitation. Partially torn chord involving the P3 segment.  ------------------------------------------------------------------- Left atrium:  The atrium was normal in size.  No evidence of thrombus in the atrial cavity or appendage.  No evidence of thrombus in the appendage. The appendage was of normal size. Emptying velocity was normal.  ------------------------------------------------------------------- Atrial septum:  No defect or patent foramen ovale was identified by color flow Doppler.  ------------------------------------------------------------------- Right ventricle:  The cavity size was normal. Wall thickness was normal. Systolic function was normal.  ------------------------------------------------------------------- Pulmonic valve:    Structurally normal valve.   Cusp separation was normal.  No evidence of vegetation.  Doppler:  There was mild regurgitation.  ------------------------------------------------------------------- Tricuspid valve:   Doppler:  There was mild regurgitation.  ------------------------------------------------------------------- Right atrium:  The atrium was normal in size.  No evidence of thrombus in the atrial cavity or appendage.  ------------------------------------------------------------------- Pericardium:  The pericardium was normal in appearance. There was no pericardial  effusion.  ------------------------------------------------------------------- Post procedure conclusions Ascending Aorta:  - The aorta was not dilated and non-diseased.  ------------------------------------------------------------------- Measurements  Aorta                                      Value       Reference Ascending aorta ID, A-P, mid, ED           28.68 mm    21 - 34  Left atrium                                Value       Reference LA appendage peak velocity                 48.3  cm/s  ---------  Mitral valve                               Value       Reference Mitral maximal regurg velocity, PISA       506   cm/s  --------- Mitral regurg VTI, PISA                    164   cm    --------- Mitral ERO, PISA                           0.12  cm^2  --------- Mitral regurg volume, PISA                 20    ml    ---------  Tricuspid valve                            Value       Reference Tricuspid regurg peak velocity             240   cm/s  --------- Tricuspid peak RV-RA gradient              23    mm Hg ---------  Legend: (L)  and  (H)  Srinivas Lippman values outside specified reference range.  ------------------------------------------------------------------- Prepared and Electronically Authenticated by  Chilton Si, MD 2018-04-27T07:49:08           EKG:  EKG is personally reviewed.  03/22/2023: Atrial fibrillation heart rate 68 bpm nonspecific ST-T wave changes 01/28/2022: : Atrial fibrillation. Rate  86 bpm.  10/13/2020: atrial fibrillation 87 no other abnormalities.  Recent Labs: No results found for requested labs within last 365 days.  Recent Lipid Panel    Component Value Date/Time   CHOL 166 04/19/2018 1034   TRIG 140 04/19/2018 1034   HDL 35 (L) 04/19/2018 1034   CHOLHDL 4.7 (H) 04/19/2018 1034   LDLCALC 103 (H) 04/19/2018 1034     Physical Exam:    VS:  BP 100/70   Pulse 80   Ht 5\' 1"  (1.549 m)   Wt 210 lb (95.3 kg)   LMP 10/26/1999   SpO2 96%    BMI 39.68 kg/m     Wt Readings from Last 3 Encounters:  03/22/23 210 lb (95.3 kg)  06/09/22 214 lb (97.1 kg)  01/28/22 214 lb 3.2 oz (97.2 kg)    GEN: Well nourished, well developed, in no acute distress HEENT: normal Neck: no JVD, carotid bruits, or masses Cardiac: irreg; 1/6 systolic murmur, no rubs, or gallops,no edema  Respiratory:  clear to auscultation bilaterally, normal work of breathing GI: soft, nontender, nondistended, + BS MS: no deformity or atrophy Skin: warm and dry, no rash Neuro:  Alert and Oriented x 3, Strength and sensation are intact Psych: euthymic mood, full affect   ASSESSMENT:    1. Persistent atrial fibrillation (HCC)   2. Fall, sequela   3. Hypercoagulable state due to longstanding persistent atrial fibrillation (HCC)   4. Aortic valve stenosis, etiology of cardiac valve disease unspecified      PLAN:    In order of problems listed above:  Persistent atrial fibrillation (HCC) shortness of breath Currently well rate controlled 86 bpm on ECG.  No need for ablative therapy.  Continue with digoxin and metoprolol.  Medication doses noted.  Hemoglobin 12.  On Xarelto.  Brother and sister both have A-fib as well.  We will check an echocardiogram given her shortness of breath.  Discussed conditioning efforts.  Discussed the possibility of SGLT2 inhibitor however cost may be an issue.  Falls Unfortunately had a fall previously in bathroom.  Vertebral compression fracture.  Ambulates with a cane.  Hypercoagulable state due to atrial fibrillation (HCC) Currently on Xarelto.  We did discuss potential Watchman device.  She has had a couple falls.  Likely she has not hit her head.  At this time she is not interested.  Disequilibrium Previously had physical therapy.  Morbid obesity (HCC) Continuing to work on weight loss.  Hyperlipidemia LDL 91.  On lovastatin 40 mg.  No myalgias.   Follow-up 6 month APP  Medication Adjustments/Labs and Tests  Ordered: Current medicines are reviewed at length with the patient today.  Concerns regarding medicines are outlined above.  Orders Placed This Encounter  Procedures   EKG 12-Lead   ECHOCARDIOGRAM COMPLETE   No orders of the defined types were placed in this encounter.   Patient Instructions  Medication Instructions:  The current medical regimen is effective;  continue present plan and medications.  *If you need a refill on your cardiac medications before your next appointment, please call your pharmacy*  Testing/Procedures: Your physician has requested that you have an echocardiogram. Echocardiography is a painless test that uses sound waves to create images of your heart. It provides your doctor with information about the size and shape of your heart and how well your heart's chambers and valves are working. This procedure takes approximately one hour. There are no restrictions for this procedure. Please do NOT wear cologne, perfume, aftershave,  or lotions (deodorant is allowed). Please arrive 15 minutes prior to your appointment time.   Follow-Up: At [facility], you and your health needs are our priority.  As part of our continuing mission to provide you with exceptional heart care, we have created designated Provider Care Teams.  These Care Teams include your primary Cardiologist (physician) and Advanced Practice Providers (APPs -  Physician Assistants and Nurse Practitioners) who all work together to provide you with the care you need, when you need it.  We recommend signing up for the patient portal called "MyChart".  Sign up information is provided on this After Visit Summary.  MyChart is used to connect with patients for Virtual Visits (Telemedicine).  Patients are able to view lab/test results, encounter notes, upcoming appointments, etc.  Non-urgent messages can be sent to your provider as well.   To learn more about what you can do with MyChart, go to  [email or web address].    Your next appointment:   1 year(s)  Provider:   Donato Schultz, MD          Signed, Donato Schultz, MD  03/22/2023 10:43 AM    [facility]

## 2023-04-04 ENCOUNTER — Other Ambulatory Visit: Payer: Self-pay | Admitting: Cardiology

## 2023-04-04 DIAGNOSIS — I4891 Unspecified atrial fibrillation: Secondary | ICD-10-CM

## 2023-04-04 NOTE — Telephone Encounter (Signed)
Xarelto 20mg  refill request received. Pt is 77 years old, weight-95.3kg, Crea-0.90 on 02/08/23 via [facility] from [place], last seen by Dr. Anne Fu on 03/22/23, Diagnosis-Afib, CrCl-78.75 mL/min; Dose is appropriate based on dosing criteria. Will send in refill to requested pharmacy.

## 2023-04-25 ENCOUNTER — Ambulatory Visit (HOSPITAL_COMMUNITY): Payer: Medicare Other | Attending: Cardiovascular Disease

## 2023-04-25 DIAGNOSIS — I4819 Other persistent atrial fibrillation: Secondary | ICD-10-CM | POA: Insufficient documentation

## 2023-04-25 DIAGNOSIS — I350 Nonrheumatic aortic (valve) stenosis: Secondary | ICD-10-CM | POA: Diagnosis not present

## 2023-04-25 LAB — ECHOCARDIOGRAM COMPLETE
AR max vel: 1.46 cm2
AV Area VTI: 1.4 cm2
AV Area mean vel: 1.46 cm2
AV Mean grad: 6 mmHg
AV Peak grad: 12.3 mmHg
Ao pk vel: 1.75 m/s
MV M vel: 4.06 m/s
MV Peak grad: 66 mmHg
P 1/2 time: 998 msec
S' Lateral: 3.4 cm

## 2023-06-06 ENCOUNTER — Ambulatory Visit (INDEPENDENT_AMBULATORY_CARE_PROVIDER_SITE_OTHER): Payer: Medicare Other | Admitting: Family Medicine

## 2023-06-06 ENCOUNTER — Encounter: Payer: Self-pay | Admitting: Family Medicine

## 2023-06-06 VITALS — BP 126/74 | HR 71 | Ht 67.0 in | Wt 213.5 lb

## 2023-06-06 DIAGNOSIS — W19XXXD Unspecified fall, subsequent encounter: Secondary | ICD-10-CM

## 2023-06-06 DIAGNOSIS — I4819 Other persistent atrial fibrillation: Secondary | ICD-10-CM | POA: Diagnosis not present

## 2023-06-06 DIAGNOSIS — G319 Degenerative disease of nervous system, unspecified: Secondary | ICD-10-CM | POA: Diagnosis not present

## 2023-06-06 DIAGNOSIS — R4789 Other speech disturbances: Secondary | ICD-10-CM | POA: Diagnosis not present

## 2023-06-06 DIAGNOSIS — G40909 Epilepsy, unspecified, not intractable, without status epilepticus: Secondary | ICD-10-CM

## 2023-06-06 MED ORDER — LEVETIRACETAM 500 MG PO TABS: 500.0000 mg | ORAL_TABLET | Freq: Two times a day (BID) | ORAL | 4 refills | Status: DC

## 2023-06-06 NOTE — Progress Notes (Signed)
PATIENT: Ashley Pacheco DOB: 12-13-1945  REASON FOR VISIT: follow up HISTORY FROM: patient  Chief Complaint  Patient presents with   Room 2    Pt is here Alone. Pt states that she has been doing okay and she hasn't had any Seizures recently.     HISTORY OF PRESENT ILLNESS:  06/06/2023 ALL:  Ashley Pacheco returns for follow up for seizures. She continues levetiracetam 500mg  BID and tolerating well. No seizure activity. She does report continued falls. Estimates three in the last 6 months. Most all with leaning or walking on uneven ground. She uses her cane when going out. Usually does not need cane in the home. She has a walker but hasn't used. She was seen by PT last year but did not feel it was beneficial. She continues HEP at home but not sure it is helpful. She has trouble maintaining balance. She has significant bilateral knee pain. She is followed by ortho but not a candidate for surgery due to weight. She is trying to work on [employer]. She is not able to exercise. She stays in afib. Continues Xarelto. No dizziness but does have intermittent shob. MRI showed microvascular changes and atrophy in 2017. She has had some trouble with finding words at times or remembering names. No trouble managing home or driving. Able to manage meds. Performs ADLs independently. She lives alone. She had two maternal uncles with AD. She continues close follow up with PCP, cardiology and ortho.   06/09/2022 ALL: Ashley Pacheco returns for follow up for seizures. She continues levetiracetam 500mg  BID. No seizures. She is tolerating med, well. She reports four falls over the past year. Most have been in settings of putting on clothing or when stepping out of shower. She has tripped over uneven pavement. She is followed by PCP for falls. She participated in PT twice weekly for about 8-10 weeks then stopped due to knee pain. She is followed by ortho. Needs replacement but unable to due to weight. She denies stoke  symptoms. She is on Xarelto for afib. Last MRI in 2019 showed generalized atrophy. Dexa in 2022 showed osteopenia. She is on Vitamin D supplements but had to stop calcium due to kidney stones.   06/03/2021 ALL:  Ashley Pacheco returns for follow up for seizures. She continues levetiracetam 500mg  BID. She denies recent seizure activity. She continues to have trouble with feeling lightheaded when she sits up in bed. She has had a couple of falls but was not dizzy or feeling lightheaded. She feels falls are when she doesn't have her cane. She is trying to use it all the time.   06/02/2020 ALL:  Ashley Pacheco is a 77 y.o. female here today for follow up for seizures. She continues to do well on levetiracetam 500mg  BID. Last seizure in 2017. She is tolerating medication well.   She reports concerns of being off balance. She feels that this has gradually worsened. She fell last August walking up steps. She reports that she has fallen 2-3 times since. She gets dizzy with position changes. She is not sure this has been a contributor to falls. She has a significant heart history. She is now using a single prong cane. No recent falls. She has mentioned this to her PCP. She has not considered PT. She is using a "balance oil" that she feels may be helping.   HISTORY: (copied from my note on 05/30/2019)  Ashley Pacheco is a 77 y.o. female here today for follow up  of seizure. She continues levetiracetam 500mg  twice daily. She denies seizure activity. Last seizure in 2017. She reports that headaches have resolved. She rarely has one. She is doing well and without complaints.    HISTORY: (copied from Dr Richrd Humbles note on 05/24/2018)   UPDATE (05/24/18, VRP): Since last visit, doing well. No seizures. However, now with new HA x 2-3 months, 2 per week, occipital. No other sxs. No similar HA in the past. Severity is mild. No alleviating or aggravating factors. Tolerating levetiracetam.     UPDATE 04/01/17: Since last visit, was  in hosp in April 2018, dx'd with afib and RVR, now on xarelto and diltiazem. Now doing well. No seizures. Tolerating LEV.    UPDATE 10/01/16: Since last visit, doing well. No sz. Tolerating LEV. No other abnl spells. She has some snoring, but no sig daytime fatigue. She is concerned about risk of stroke due to her family history.    PRIOR HPI (08/13/16): 77 year old right-handed female here for evaluation of possible seizure. Patient has history of hypertension, hyperglycemia depression and heart flutter. 08/10/16, patient was at home playing solitaire on her tablet. She was sitting in a lounge chair where she sometimes takes a nap in the afternoon. Around 2 PM patient noticed that this screen on the tablet was "wavy". Patient looked away from the tablet and saw an afterimage of the tablet follow her visual tracking to the right side. Patient then tried to use the TV remote to turn off the television. She tried pressing a few buttons was unable to turn off the TV. The next thing she remembered she was waking up, noticed that the TV remote was on the floor and the television was still on. She then noticed that her tongue and throat were very sore. She got up and looked in the mirror she found that she had severely bitten her tongue on both sides. No incontinence. No definite postictal confusion. She did not feel sore in her arms or legs. In retrospect patient had a similar but minor event in spring of 2017 where she was using her tablet and saw a "wavy screen" lasting for approximately 1 minute. She did not have tongue biting or any other symptoms with that episode. Patient has normal worth and development mental history. No history of seizure in the past. No head trauma, meningitis or encephalitis in the past. No family history of seizure. Patient lives alone, is fairly active, takes care of all ADLs. No recent change in medication, alcohol, sleep, stress or other events.   REVIEW OF SYSTEMS: Out of a complete  14 system review of symptoms, the patient complains only of the following symptoms, seizures, imbalance, falls and all other reviewed systems are negative.  ALLERGIES: Allergies  Allergen Reactions   Adhesive [Tape] Rash   Telmisartan     Other reaction(s): cough    HOME MEDICATIONS: Outpatient Medications Prior to Visit  Medication Sig Dispense Refill   Cholecalciferol (VITAMIN D) 2000 UNITS tablet Take 2,000 Units by mouth daily.      Cod Liver Oil 1000 MG CAPS Take 1 capsule by mouth daily as needed.     digoxin (LANOXIN) 0.125 MG tablet TAKE 1 TABLET BY MOUTH EVERY DAY 90 tablet 3   diphenhydrAMINE (BENADRYL) 25 MG tablet Take 25 mg by mouth as needed (allergies).      Ferrous Gluconate (IRON 27 PO) Take by mouth as needed.     furosemide (LASIX) 40 MG tablet TAKE 1 TABLET BY MOUTH EVERY  DAY 90 tablet 2   loperamide (IMODIUM) 2 MG capsule Take 2 mg by mouth as needed for diarrhea or loose stools.     lovastatin (MEVACOR) 40 MG tablet Take 40 mg by mouth every morning.      metoprolol succinate (TOPROL-XL) 50 MG 24 hr tablet TAKE 1 TABLET BY MOUTH IN THE MORNING AND AT BEDTIME. PLEASE KEEP UPCOMING APPT IN DECEMBER (Patient taking differently: TAKE 1 TABLET BY MOUTH IN THE MORNING AND AT BEDTIME.) 180 tablet 2   Multiple Vitamin (MULTI-VITAMIN DAILY PO) Take 1 tablet by mouth daily.      naproxen sodium (ALEVE) 220 MG tablet Take 220 mg by mouth daily as needed (rarely as needed for knee pain).     sertraline (ZOLOFT) 100 MG tablet Take 100 mg by mouth daily.     traZODone (DESYREL) 150 MG tablet Take 150 mg by mouth at bedtime.      XARELTO 20 MG TABS tablet TAKE 1 TABLET BY MOUTH EVERY DAY 90 tablet 1   levETIRAcetam (KEPPRA) 500 MG tablet Take 1 tablet (500 mg total) by mouth 2 (two) times daily. 180 tablet 4   No facility-administered medications prior to visit.    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 2015 Neg MV.   Cardiomegaly 2005   Cardiomyopathy [facility])    a. 01/2017 Echo: EF 45%, diff HK, mild LVH, triv AI, mild MR, mod dil LA, mildly dil RA, PASP - in setting of Afib.   DVT (deep venous thrombosis) (HCC) 02/2004   Fibroid    Hypercholesteremia    Hypertension    Menometrorrhagia 3/92   menopausal   Microhematuria 1/96   Mitral regurgitation 05/18/2017   Nephrolithiasis 2015   PAF (paroxysmal atrial fibrillation) (HCC)    a. 01/2017 TEE/DCCV: EF 45-50%, no LA/LAA thrombus-->successful DCCV @ 150 J. CHA2DS2VASc = 3-->Xarelto.   Seizure disorder (HCC) 02/17/2017   Seizures (HCC)    followed by [facility]- Dr. Marjory Lies   SUI (stress urinary incontinence, female) 9/91   Vulvar cyst 7/90    PAST SURGICAL HISTORY: Past Surgical History:  Procedure Laterality Date   APPENDECTOMY  1950   BUNIONECTOMY  7/11   second toe   CARDIOVERSION N/A 02/17/2017   Procedure: CARDIOVERSION;  Surgeon: Chilton Si, MD;  Location: [facility] ENDOSCOPY;  Service: Cardiovascular;  Laterality: N/A;   cataracts  3/09 4/09   DILATION AND CURETTAGE OF UTERUS     hystreroscopy/imcomplete polypectomy   KNEE SURGERY  10/13 or 11/13   right knee arthroscopy   TEE WITHOUT CARDIOVERSION N/A 02/17/2017   Procedure: TRANSESOPHAGEAL ECHOCARDIOGRAM (TEE);  Surgeon: Chilton Si, MD;  Location: [facility] ENDOSCOPY;  Service: Cardiovascular;  Laterality: N/A;   TONSILLECTOMY AND ADENOIDECTOMY  1950   TUBAL LIGATION  1975   BTL    FAMILY HISTORY: Family History  Problem Relation Age of Onset   Cancer Father        stomach and prostate   Kidney Stones Father    Hypertension Mother    Asthma Mother    Kidney Stones Mother    Stroke Maternal Uncle    Heart attack Maternal Uncle    Hypertension Maternal Uncle     Cancer  Paternal Uncle    Stroke Maternal Grandfather    Heart disease Sister        Rapid rhythm   Skin cancer Sister    Heart disease Brother        Rapid rhythm   Skin cancer Brother     SOCIAL HISTORY: Social History   Socioeconomic History   Marital status: Widowed    Spouse name: Not on file   Number of children: 2   Years of education: 68   Highest education level: Not on file  Occupational History    Comment: volunteer  Tobacco Use   Smoking status: Former    Types: Cigarettes   Smokeless tobacco: Never   Tobacco comments:    quit in 1969  Vaping Use   Vaping status: Never Used  Substance and Sexual Activity   Alcohol use: Yes    Alcohol/week: 7.0 standard drinks of alcohol    Types: 7 Glasses of wine per week   Drug use: No   Sexual activity: Not Currently    Partners: Male  Other Topics Concern   Not on file  Social History Narrative   Lives alone   Caffeine- coffee- 3 mugs daily   Social Determinants of Health   Financial Resource Strain: Not on file  Food Insecurity: Not on file  Transportation Needs: Not on file  Physical Activity: Not on file  Stress: Not on file  Social Connections: Not on file  Intimate Partner Violence: Not on file      PHYSICAL EXAM  Vitals:   06/06/23 1059  BP: 126/74  Pulse: 71  Weight: 213 lb 8 oz (96.8 kg)  Height: 5\' 7"  (1.702 m)      Body mass index is 33.44 kg/m.  Generalized: Well developed, in no acute distress  Cardiology: irregularly irregular, no murmur noted Respiratory: clear to auscultation bilaterally  Neurological examination  Mentation: Alert oriented to time, place, history taking. Follows all commands speech and language fluent Cranial nerve II-XII: Pupils were equal round reactive to light. Extraocular movements were full, visual field were full on confrontational test. Facial sensation and strength were normal. Head turning and shoulder shrug  were normal and symmetric. Motor: The motor  testing reveals 4+ over 5 strength of all 4 extremities. Good symmetric motor tone is noted throughout.  Sensory: Sensory testing is intact to soft touch on all 4 extremities. No evidence of extinction is noted.  Coordination: Cerebellar testing reveals good finger-nose-finger and heel-to-shin (reduced slightly due to body habitus) bilaterally.  Gait and station: Slowly pushes to standing position reporting stiffness and pain in both knees. Gait is slightly wide but stable with single prong cane. She does not pick up her feet. Patient is wearing flip flops today. Tandem gait not attempted, today Reflexes: Deep tendon reflexes are symmetric and normal bilaterally.   DIAGNOSTIC DATA (LABS, IMAGING, TESTING) - I reviewed patient records, labs, notes, testing and imaging myself where available.      No data to display           Lab Results  Component Value Date   WBC 7.9 04/19/2018   HGB 13.4 04/19/2018   HCT 40.4 04/19/2018   MCV 82 04/19/2018   PLT 194 04/19/2018      Component Value Date/Time   NA 144 04/19/2018 1034   K 4.0 04/19/2018 1034   CL 104 04/19/2018 1034   CO2 27 04/19/2018 1034   GLUCOSE 108 (H) 04/19/2018 1034   GLUCOSE 106 (H)  11/15/2017 1725   BUN 10 04/19/2018 1034   CREATININE 0.88 04/19/2018 1034   CALCIUM 8.8 04/19/2018 1034   PROT 6.0 04/19/2018 1034   ALBUMIN 3.9 04/19/2018 1034   AST 14 04/19/2018 1034   ALT 13 04/19/2018 1034   ALKPHOS 83 04/19/2018 1034   BILITOT 0.4 04/19/2018 1034   GFRNONAA 66 04/19/2018 1034   GFRAA 76 04/19/2018 1034   Lab Results  Component Value Date   CHOL 166 04/19/2018   HDL 35 (L) 04/19/2018   LDLCALC 103 (H) 04/19/2018   TRIG 140 04/19/2018   CHOLHDL 4.7 (H) 04/19/2018   Lab Results  Component Value Date   HGBA1C 5.5 11/15/2017   No results found for: "VITAMINB12" Lab Results  Component Value Date   TSH 1.940 04/19/2018       ASSESSMENT AND PLAN 77 y.o. year old female  has a past medical history of  Aortic stenosis (05/18/2017), Arthritis, Atrial fibrillation (HCC), Atrial fibrillation with rapid ventricular response (HCC) (02/14/2017), Atrial fibrillation with RVR (HCC) (11/15/2017), Atypical chest pain, Cardiomegaly (2005), Cardiomyopathy (HCC), DVT (deep venous thrombosis) (HCC) (02/2004), Fibroid, Hypercholesteremia, Hypertension, Menometrorrhagia (3/92), Microhematuria (1/96), Mitral regurgitation (05/18/2017), Nephrolithiasis (2015), PAF (paroxysmal atrial fibrillation) (HCC), Seizure disorder (HCC) (02/17/2017), Seizures (HCC), SUI (stress urinary incontinence, female) (9/91), and Vulvar cyst (7/90). here with     ICD-10-CM   1. Seizure disorder (HCC)  G40.909     2. Cerebral atrophy (HCC)  G31.9     3. Persistent atrial fibrillation (HCC)  I48.19     4. Fall, subsequent encounter  W19.XXXD     5. Word finding difficulty  R47.89       Nakyiah is doing well from a seizure standpoint. We will continue levetiracetam 500mg  twice daily. We have discussed her concerns of falls. Could consider repeat MRI. She declines at this time. Supportive shoes recommended. She was encouraged to continue using her cane and fall precautions advised. Consider using walker when needed. Memory compensation strategies reviewed. Healthy lifestyle habits encouraged. Continue working on [employer]. Continue follow up with care team as directed. She will follow up in 1 year, sooner if needed. She verbalizes understanding and agreement with this plan.    No orders of the defined types were placed in this encounter.     Meds ordered this encounter  Medications   levETIRAcetam (KEPPRA) 500 MG tablet    Sig: Take 1 tablet (500 mg total) by mouth 2 (two) times daily.    Dispense:  180 tablet    Refill:  4    Order Specific Question:   Supervising Provider    Answer:   Anson Fret [id number], FNP-C 06/06/2023, 11:28 AM [facility] [address], [address] [place], [place] [address] [phone]

## 2023-06-06 NOTE — Patient Instructions (Signed)
Below is our plan:  We will continue levetiracetam 500mg  twice daily. Consider updating MRI if you wish. Consider using walker at times where you know balance is worse.   Please make sure you are consistent with timing of seizure medication. I recommend annual visit with primary care provider (PCP) for complete physical and routine blood work. I recommend daily intake of vitamin D (400-800iu) and calcium (800-1000mg ) for bone health. Discuss Dexa screening with PCP.   According to [place] law, you can not drive unless you are seizure / syncope free for at least 6 months and under physician's care.  Please maintain precautions. Do not participate in activities where a loss of awareness could harm you or someone else. No swimming alone, no tub bathing, no hot tubs, no driving, no operating motorized vehicles (cars, ATVs, motocycles, etc), lawnmowers, power tools or firearms. No standing at heights, such as rooftops, ladders or stairs. Avoid hot objects such as stoves, heaters, open fires. Wear a helmet when riding a bicycle, scooter, skateboard, etc. and avoid areas of traffic. Set your water heater to 120 degrees or less.  SUDEP is the sudden, unexpected death of someone with epilepsy, who was otherwise healthy. In SUDEP cases, no other cause of death is found when an autopsy is done. Each year, more than 1 in 1,000 people with epilepsy die from SUDEP. This is the leading cause of death in people with uncontrolled seizures. Until further answers are available, the best way to prevent SUDEP is to lower your risk by controlling seizures. Research has found that people with all types of epilepsy that experience convulsive seizures can be at risk.  Please make sure you are staying well hydrated. I recommend 50-60 ounces daily. Well balanced diet and regular exercise encouraged. Consistent sleep schedule with 6-8 hours recommended.   Please continue follow up with care team as directed.   Follow up with me in 1  year   You may receive a survey regarding today's visit. I encourage you to leave honest feed back as I do use this information to improve patient care. Thank you for seeing me today!   Management of Memory Problems   There are some general things you can do to help manage your memory problems.  Your memory may not in fact recover, but by using techniques and strategies you will be able to manage your memory difficulties better.   1)  Establish a routine. Try to establish and then stick to a regular routine.  By doing this, you will get used to what to expect and you will reduce the need to rely on your memory.  Also, try to do things at the same time of day, such as taking your medication or checking your calendar first thing in the morning. Think about think that you can do as a part of a regular routine and make a list.  Then enter them into a daily planner to remind you.  This will help you establish a routine.   2)  Organize your environment. Organize your environment so that it is uncluttered.  Decrease visual stimulation.  Place everyday items such as keys or cell phone in the same place every day (ie.  Basket next to front door) Use post it notes with a brief message to yourself (ie. Turn off light, lock the door) Use labels to indicate where things go (ie. Which cupboards are for food, dishes, etc.) Keep a notepad and pen by the telephone to take messages  3)  Memory Aids A diary or journal/notebook/daily planner Making a list (shopping list, chore list, to do list that needs to be done) Using an alarm as a reminder (kitchen timer or cell phone alarm) Using cell phone to store information (Notes, Calendar, Reminders) Calendar/White board placed in a prominent position Post-it notes   In order for memory aids to be useful, you need to have good habits.  It's no good remembering to make a note in your journal if you don't remember to look in it.  Try setting aside a certain time of  day to look in journal.   4)  Improving mood and managing fatigue. There may be other factors that contribute to memory difficulties.  Factors, such as anxiety, depression and tiredness can affect memory. Regular gentle exercise can help improve your mood and give you more energy. Exercise: there are short videos created by the [employer] on Health specially for older adults: https://bit.ly/2I30q97.  Mediterranean diet: which emphasizes fruits, vegetables, whole grains, legumes, fish, and other seafood; unsaturated fats such as olive oils; and low amounts of red meat, eggs, and sweets. A variation of this, called MIND (Mediterranean-DASH Intervention for Neurodegenerative Delay) incorporates the DASH (Dietary Approaches to Stop Hypertension) diet, which has been shown to lower high blood pressure, a risk factor for Alzheimer's disease. More information at: ExitMarketing.de.  Aerobic exercise that improve heart health is also good for the mind.  [employer] on Aging have short videos for exercises that you can do at home: BlindWorkshop.com.pt Simple relaxation techniques may help relieve symptoms of anxiety Try to get back to completing activities or hobbies you enjoyed doing in the past. Learn to pace yourself through activities to decrease fatigue. Find out about some local support groups where you can share experiences with others. Try and achieve 7-8 hours of sleep at night.   Tasks to improve attention/working memory 1. Good sleep hygiene (7-8 hrs of sleep) 2. Learning a new skill (Painting, Carpentry, Pottery, new language, Knitting). 3.Cognitive exercises (keep a daily journal, Puzzles) 4. Physical exercise and training  (30 min/day X 4 days week) 5. Being on Antidepressant if needed 6.Yoga, Meditation, Tai Chi 7. Decrease alcohol intake 8.Have a clear schedule and structure in daily routine    MIND Diet: The Mediterranean-DASH Diet Intervention for Neurodegenerative Delay, or MIND diet, targets the health of the aging brain. Research participants with the highest MIND diet scores had a significantly slower rate of cognitive decline compared with those with the lowest scores. The effects of the MIND diet on cognition showed greater effects than either the Mediterranean or the DASH diet alone.   The healthy items the MIND diet guidelines suggest include:   3+ servings a day of whole grains 1+ servings a day of vegetables (other than green leafy) 6+ servings a week of green leafy vegetables 5+ servings a week of nuts 4+ meals a week of beans 2+ servings a week of berries 2+ meals a week of poultry 1+ meals a week of fish Mainly olive oil if added fat is used   The unhealthy items, which are higher in saturated and trans fat, include: Less than 5 servings a week of pastries and sweets Less than 4 servings a week of red meat (including beef, pork, lamb, and products made from these meats) Less than one serving a week of cheese and fried foods Less than 1 tablespoon a day of butter/stick margarine

## 2023-06-20 DIAGNOSIS — H18513 Endothelial corneal dystrophy, bilateral: Secondary | ICD-10-CM | POA: Diagnosis not present

## 2023-06-20 DIAGNOSIS — Z961 Presence of intraocular lens: Secondary | ICD-10-CM | POA: Diagnosis not present

## 2023-06-20 DIAGNOSIS — H43813 Vitreous degeneration, bilateral: Secondary | ICD-10-CM | POA: Diagnosis not present

## 2023-06-29 DIAGNOSIS — L820 Inflamed seborrheic keratosis: Secondary | ICD-10-CM | POA: Diagnosis not present

## 2023-07-20 DIAGNOSIS — Z23 Encounter for immunization: Secondary | ICD-10-CM | POA: Diagnosis not present

## 2023-08-23 DIAGNOSIS — R7303 Prediabetes: Secondary | ICD-10-CM | POA: Diagnosis not present

## 2023-08-23 DIAGNOSIS — I5020 Unspecified systolic (congestive) heart failure: Secondary | ICD-10-CM | POA: Diagnosis not present

## 2023-08-23 DIAGNOSIS — G8929 Other chronic pain: Secondary | ICD-10-CM | POA: Diagnosis not present

## 2023-08-23 DIAGNOSIS — I4821 Permanent atrial fibrillation: Secondary | ICD-10-CM | POA: Diagnosis not present

## 2023-08-23 DIAGNOSIS — I10 Essential (primary) hypertension: Secondary | ICD-10-CM | POA: Diagnosis not present

## 2023-08-23 DIAGNOSIS — E559 Vitamin D deficiency, unspecified: Secondary | ICD-10-CM | POA: Diagnosis not present

## 2023-08-23 DIAGNOSIS — M8588 Other specified disorders of bone density and structure, other site: Secondary | ICD-10-CM | POA: Diagnosis not present

## 2023-08-23 DIAGNOSIS — M545 Low back pain, unspecified: Secondary | ICD-10-CM | POA: Diagnosis not present

## 2023-08-23 DIAGNOSIS — F321 Major depressive disorder, single episode, moderate: Secondary | ICD-10-CM | POA: Diagnosis not present

## 2023-08-23 DIAGNOSIS — Z23 Encounter for immunization: Secondary | ICD-10-CM | POA: Diagnosis not present

## 2023-08-23 DIAGNOSIS — Z0000 Encounter for general adult medical examination without abnormal findings: Secondary | ICD-10-CM | POA: Diagnosis not present

## 2023-08-23 DIAGNOSIS — E7800 Pure hypercholesterolemia, unspecified: Secondary | ICD-10-CM | POA: Diagnosis not present

## 2023-08-24 ENCOUNTER — Other Ambulatory Visit: Payer: Self-pay | Admitting: Family Medicine

## 2023-08-24 DIAGNOSIS — M8580 Other specified disorders of bone density and structure, unspecified site: Secondary | ICD-10-CM

## 2023-08-24 DIAGNOSIS — M810 Age-related osteoporosis without current pathological fracture: Secondary | ICD-10-CM

## 2024-02-03 ENCOUNTER — Other Ambulatory Visit: Payer: Self-pay | Admitting: Cardiology

## 2024-02-03 DIAGNOSIS — I4891 Unspecified atrial fibrillation: Secondary | ICD-10-CM

## 2024-02-06 NOTE — Telephone Encounter (Signed)
 Prescription refill request for Xarelto received.  Indication:afib Last office visit:5/24 Weight:96.8  kg Age:78 UJW:JXBJY labs CrCl:needs labs  Prescription refilled

## 2024-02-21 DIAGNOSIS — E66813 Obesity, class 3: Secondary | ICD-10-CM | POA: Diagnosis not present

## 2024-02-21 DIAGNOSIS — G40909 Epilepsy, unspecified, not intractable, without status epilepticus: Secondary | ICD-10-CM | POA: Diagnosis not present

## 2024-02-21 DIAGNOSIS — M545 Low back pain, unspecified: Secondary | ICD-10-CM | POA: Diagnosis not present

## 2024-02-21 DIAGNOSIS — I10 Essential (primary) hypertension: Secondary | ICD-10-CM | POA: Diagnosis not present

## 2024-02-21 DIAGNOSIS — E7800 Pure hypercholesterolemia, unspecified: Secondary | ICD-10-CM | POA: Diagnosis not present

## 2024-02-21 DIAGNOSIS — I4821 Permanent atrial fibrillation: Secondary | ICD-10-CM | POA: Diagnosis not present

## 2024-02-21 DIAGNOSIS — E559 Vitamin D deficiency, unspecified: Secondary | ICD-10-CM | POA: Diagnosis not present

## 2024-02-21 DIAGNOSIS — I5020 Unspecified systolic (congestive) heart failure: Secondary | ICD-10-CM | POA: Diagnosis not present

## 2024-02-21 DIAGNOSIS — F3341 Major depressive disorder, recurrent, in partial remission: Secondary | ICD-10-CM | POA: Diagnosis not present

## 2024-03-09 ENCOUNTER — Other Ambulatory Visit: Payer: Self-pay | Admitting: Cardiology

## 2024-03-09 DIAGNOSIS — I4891 Unspecified atrial fibrillation: Secondary | ICD-10-CM

## 2024-03-09 NOTE — Telephone Encounter (Signed)
 Prescription refill request for Xarelto  received.  Indication:afib Last office visit:5/24 Weight:96.8  kg Age:78 Scr:0.79  4/25 CrCl:89.69  ml/min  Prescription refilled

## 2024-03-09 NOTE — Telephone Encounter (Signed)
 Called PCP, labs will be fax to anticoagulation clinic.

## 2024-03-09 NOTE — Telephone Encounter (Signed)
 Prescription refill request for Xarelto  received.  Indication:Afib  Last office visit: 03/22/23 Renna Cary)  Weight: 96.8kg Age: 78 Scr:  CrCl:   Labs overdue. Will call PCP for most recent labs.

## 2024-03-21 DIAGNOSIS — M545 Low back pain, unspecified: Secondary | ICD-10-CM | POA: Diagnosis not present

## 2024-03-27 ENCOUNTER — Encounter: Payer: Self-pay | Admitting: Cardiology

## 2024-03-27 ENCOUNTER — Ambulatory Visit: Attending: Cardiology | Admitting: Cardiology

## 2024-03-27 VITALS — BP 116/78 | HR 80 | Ht 61.0 in | Wt 210.0 lb

## 2024-03-27 DIAGNOSIS — I4819 Other persistent atrial fibrillation: Secondary | ICD-10-CM | POA: Diagnosis not present

## 2024-03-27 DIAGNOSIS — D6869 Other thrombophilia: Secondary | ICD-10-CM | POA: Diagnosis not present

## 2024-03-27 DIAGNOSIS — W19XXXS Unspecified fall, sequela: Secondary | ICD-10-CM

## 2024-03-27 DIAGNOSIS — I4811 Longstanding persistent atrial fibrillation: Secondary | ICD-10-CM

## 2024-03-27 NOTE — Patient Instructions (Signed)
 Medication Instructions:  The current medical regimen is effective;  continue present plan and medications.  *If you need a refill on your cardiac medications before your next appointment, please call your pharmacy*  Follow-Up: At [facility], you and your health needs are our priority.  As part of our continuing mission to provide you with exceptional heart care, our providers are all part of one team.  This team includes your primary Cardiologist (physician) and Advanced Practice Providers or APPs (Physician Assistants and Nurse Practitioners) who all work together to provide you with the care you need, when you need it.  Your next appointment:   1 year(s)  Provider:   Dorothye Gathers, MD    We recommend signing up for the patient portal called "MyChart".  Sign up information is provided on this After Visit Summary.  MyChart is used to connect with patients for Virtual Visits (Telemedicine).  Patients are able to view lab/test results, encounter notes, upcoming appointments, etc.  Non-urgent messages can be sent to your provider as well.   To learn more about what you can do with MyChart, go to [email or web address].   Thank you for choosing [facility]!!

## 2024-03-27 NOTE — Progress Notes (Signed)
 Cardiology Office Note:  .   Date:  03/27/2024  ID:  [name], DOB [birth year], MRN [id number] PCP: Annamarie Kid, MD  [facility] Providers Cardiologist:  Dorothye Gathers, MD    History of Present Illness: .   Ashley Pacheco is a 78 y.o. female Discussed the use of AI scribe software for clinical note transcription with the patient, who gave verbal consent to proceed.  History of Present Illness Ashley Pacheco is a 78 year old female with longstanding persistent atrial fibrillation who presents for follow-up.  She is currently symptom-free with adequate rate control of her atrial fibrillation. Her medication regimen includes Xarelto , Toprol  50 mg, and digoxin  0.125 mg, which have been effective since her hospital stay.  She has been experiencing issues with disequilibrium and has had a couple of falls. She is undergoing physical therapy to address her balance issues and uses a cane regularly. Neighbors have assisted her after falls. No loss of consciousness or head injury during these incidents. She experiences pain after a fall, which is attributed to a muscle issue near her lower rib. She was taking Aleve for pain but stopped due to bleeding risk concerns with Xarelto . Tylenol  is ineffective for her pain management.  Her LDL cholesterol is 69 on lovastatin 40 mg, and her A1c is 6. Her hemoglobin is 12, and creatinine is 0.88.      ROS: No bleeding  Studies Reviewed: Ashley Pacheco    EKG Interpretation Date/Time:  Tuesday March 27 2024 09:39:10 EDT Ventricular Rate:  80 PR Interval:    QRS Duration:  98 QT Interval:  366 QTC Calculation: 422 R Axis:   11  Text Interpretation: Atrial fibrillation Low voltage QRS Incomplete right bundle branch block ST & T wave abnormality, consider anterior ischemia When compared with ECG of 17-Feb-2017 12:08, Atrial fibrillation has replaced Sinus rhythm Confirmed by Dorothye Gathers ([id number]) on 03/27/2024 10:07:09 AM    Results LABS LDL: 69  mg/dL K4M: 6% Hb: 12 g/dL Cr: 0.10 mg/dL  DIAGNOSTIC EKG: Atrial fibrillation under good control (03/27/2024)  Risk Assessment/Calculations:            Physical Exam:   VS:  BP 116/78   Pulse 80   Ht 5\' 7"  (1.702 m)   Wt 210 lb (95.3 kg)   LMP 10/26/1999   SpO2 95%   BMI 32.89 kg/m    Wt Readings from Last 3 Encounters:  03/27/24 210 lb (95.3 kg)  06/06/23 213 lb 8 oz (96.8 kg)  03/22/23 210 lb (95.3 kg)    GEN: Well nourished, well developed in no acute distress NECK: No JVD; No carotid bruits CARDIAC: Irreg irreg, 2/6 SM, no rubs, no gallops RESPIRATORY:  Clear to auscultation without rales, wheezing or rhonchi  ABDOMEN: Soft, non-tender, non-distended EXTREMITIES:  No edema; No deformity   ASSESSMENT AND PLAN: .    Assessment and Plan Assessment & Plan Persistent atrial fibrillation Longstanding persistent atrial fibrillation with adequate rate control and asymptomatic. Current management with metoprolol  and digoxin  is effective. EKG shows good control. Discussed Watchman device, but she is not interested at this time. - Continue metoprolol  50 mg oral bid - Continue digoxin  0.125 mg oral daily - Continue Xarelto  20 mg oral daily - Follow up in one year  Fall, sequela Reports balance issues and falls. Currently undergoing physical therapy. Pain after falls, likely musculoskeletal, at the lower rib. Advised against Aleve due to bleeding risk with Xarelto . Tylenol  is ineffective for her pain. -  Continue physical therapy - Avoid Aleve due to bleeding risk with Xarelto   Hyperlipidemia LDL cholesterol is well-controlled at 69 mg/dL on lovastatin 40 mg. - Continue lovastatin 40 mg oral daily  Mild aortic stenosis-2024 echocardiogram EF 45 to 50%.  Continue to monitor.       Dispo: 1 yr  Signed, Dorothye Gathers, MD

## 2024-03-29 DIAGNOSIS — M170 Bilateral primary osteoarthritis of knee: Secondary | ICD-10-CM | POA: Diagnosis not present

## 2024-03-29 DIAGNOSIS — M25561 Pain in right knee: Secondary | ICD-10-CM | POA: Diagnosis not present

## 2024-03-29 DIAGNOSIS — M25562 Pain in left knee: Secondary | ICD-10-CM | POA: Diagnosis not present

## 2024-03-29 DIAGNOSIS — R262 Difficulty in walking, not elsewhere classified: Secondary | ICD-10-CM | POA: Diagnosis not present

## 2024-03-30 ENCOUNTER — Other Ambulatory Visit: Payer: Self-pay | Admitting: Family Medicine

## 2024-03-30 DIAGNOSIS — M810 Age-related osteoporosis without current pathological fracture: Secondary | ICD-10-CM

## 2024-03-30 DIAGNOSIS — M545 Low back pain, unspecified: Secondary | ICD-10-CM | POA: Diagnosis not present

## 2024-03-30 DIAGNOSIS — Z1231 Encounter for screening mammogram for malignant neoplasm of breast: Secondary | ICD-10-CM

## 2024-04-02 ENCOUNTER — Ambulatory Visit (HOSPITAL_BASED_OUTPATIENT_CLINIC_OR_DEPARTMENT_OTHER)
Admission: RE | Admit: 2024-04-02 | Discharge: 2024-04-02 | Disposition: A | Discharge status: Home / Self Care | Source: Ambulatory Visit | Attending: Family Medicine | Admitting: Family Medicine

## 2024-04-02 DIAGNOSIS — Z13820 Encounter for screening for osteoporosis: Secondary | ICD-10-CM | POA: Diagnosis not present

## 2024-04-02 DIAGNOSIS — M8580 Other specified disorders of bone density and structure, unspecified site: Secondary | ICD-10-CM

## 2024-04-02 DIAGNOSIS — Z780 Asymptomatic menopausal state: Secondary | ICD-10-CM | POA: Insufficient documentation

## 2024-04-02 DIAGNOSIS — M85851 Other specified disorders of bone density and structure, right thigh: Secondary | ICD-10-CM | POA: Diagnosis not present

## 2024-04-03 DIAGNOSIS — M545 Low back pain, unspecified: Secondary | ICD-10-CM | POA: Diagnosis not present

## 2024-04-05 DIAGNOSIS — M170 Bilateral primary osteoarthritis of knee: Secondary | ICD-10-CM | POA: Diagnosis not present

## 2024-04-05 DIAGNOSIS — R262 Difficulty in walking, not elsewhere classified: Secondary | ICD-10-CM | POA: Diagnosis not present

## 2024-04-05 DIAGNOSIS — M25562 Pain in left knee: Secondary | ICD-10-CM | POA: Diagnosis not present

## 2024-04-05 DIAGNOSIS — M25561 Pain in right knee: Secondary | ICD-10-CM | POA: Diagnosis not present

## 2024-04-10 DIAGNOSIS — M545 Low back pain, unspecified: Secondary | ICD-10-CM | POA: Diagnosis not present

## 2024-04-12 DIAGNOSIS — R262 Difficulty in walking, not elsewhere classified: Secondary | ICD-10-CM | POA: Diagnosis not present

## 2024-04-12 DIAGNOSIS — M25561 Pain in right knee: Secondary | ICD-10-CM | POA: Diagnosis not present

## 2024-04-12 DIAGNOSIS — M170 Bilateral primary osteoarthritis of knee: Secondary | ICD-10-CM | POA: Diagnosis not present

## 2024-04-12 DIAGNOSIS — M25562 Pain in left knee: Secondary | ICD-10-CM | POA: Diagnosis not present

## 2024-04-17 DIAGNOSIS — M545 Low back pain, unspecified: Secondary | ICD-10-CM | POA: Diagnosis not present

## 2024-04-20 DIAGNOSIS — M25562 Pain in left knee: Secondary | ICD-10-CM | POA: Diagnosis not present

## 2024-04-20 DIAGNOSIS — R262 Difficulty in walking, not elsewhere classified: Secondary | ICD-10-CM | POA: Diagnosis not present

## 2024-04-20 DIAGNOSIS — M170 Bilateral primary osteoarthritis of knee: Secondary | ICD-10-CM | POA: Diagnosis not present

## 2024-04-20 DIAGNOSIS — M25561 Pain in right knee: Secondary | ICD-10-CM | POA: Diagnosis not present

## 2024-04-23 ENCOUNTER — Other Ambulatory Visit: Payer: Medicare Other

## 2024-04-23 ENCOUNTER — Ambulatory Visit
Admission: RE | Admit: 2024-04-23 | Discharge: 2024-04-23 | Disposition: A | Discharge status: Home / Self Care | Payer: Medicare Other | Source: Ambulatory Visit | Attending: Family Medicine

## 2024-04-23 DIAGNOSIS — Z1231 Encounter for screening mammogram for malignant neoplasm of breast: Secondary | ICD-10-CM

## 2024-04-24 DIAGNOSIS — M545 Low back pain, unspecified: Secondary | ICD-10-CM | POA: Diagnosis not present

## 2024-04-26 DIAGNOSIS — M25561 Pain in right knee: Secondary | ICD-10-CM | POA: Diagnosis not present

## 2024-04-26 DIAGNOSIS — M170 Bilateral primary osteoarthritis of knee: Secondary | ICD-10-CM | POA: Diagnosis not present

## 2024-04-26 DIAGNOSIS — M25562 Pain in left knee: Secondary | ICD-10-CM | POA: Diagnosis not present

## 2024-04-26 DIAGNOSIS — R262 Difficulty in walking, not elsewhere classified: Secondary | ICD-10-CM | POA: Diagnosis not present

## 2024-05-01 DIAGNOSIS — M545 Low back pain, unspecified: Secondary | ICD-10-CM | POA: Diagnosis not present

## 2024-05-12 ENCOUNTER — Other Ambulatory Visit: Payer: Self-pay | Admitting: Cardiology

## 2024-06-06 ENCOUNTER — Ambulatory Visit: Payer: Medicare Other | Admitting: Family Medicine

## 2024-06-06 ENCOUNTER — Encounter: Payer: Self-pay | Admitting: Family Medicine

## 2024-06-06 VITALS — BP 130/80 | HR 86 | Ht 61.0 in | Wt 211.5 lb

## 2024-06-06 DIAGNOSIS — G319 Degenerative disease of nervous system, unspecified: Secondary | ICD-10-CM | POA: Diagnosis not present

## 2024-06-06 DIAGNOSIS — W19XXXD Unspecified fall, subsequent encounter: Secondary | ICD-10-CM

## 2024-06-06 DIAGNOSIS — G40909 Epilepsy, unspecified, not intractable, without status epilepticus: Secondary | ICD-10-CM

## 2024-06-06 MED ORDER — LEVETIRACETAM 500 MG PO TABS: 500.0000 mg | ORAL_TABLET | Freq: Two times a day (BID) | ORAL | 4 refills | Status: AC

## 2024-06-06 NOTE — Progress Notes (Signed)
 PATIENT: Ashley Pacheco DOB: 1946-04-09  REASON FOR VISIT: follow up HISTORY FROM: patient  Chief Complaint  Patient presents with   Follow-up    Pt in room 2. Alone.Here for seizure follow up. Patient reports doing well, no recent seizures.     HISTORY OF PRESENT ILLNESS:  06/06/2024 ALL:  Ashley Pacheco returns for follow up for seizures. She was last seen 05/2023 and doing well from a seizure standpoint. MRI offered due to increased falls but declined. Supportive shoes and assistive device advised.   Since, she reports doing well. No seizure activity. She is tolerating levetiracetam  500mg  BID. She does continue to have some falls. Estimates about 3-4 over the past year. Fortunately no injuries. She went to PT for back pain and reports they helped assessed gait. She has HEP that she does regularly. She uses her cane all the time. She has a walker but doesn't feel it is needed. She continues close follow up with ortho for bilateral knee pain. Now seeing Dr Dayna with [facility]. Labs checked regularly through PCP.   06/06/2023 ALL:  Ashley Pacheco returns for follow up for seizures. She continues levetiracetam  500mg  BID and tolerating well. No seizure activity. She does report continued falls. Estimates three in the last 6 months. Most all with leaning or walking on uneven ground. She uses her cane when going out. Usually does not need cane in the home. She has a walker but hasn't used. She was seen by PT last year but did not feel it was beneficial. She continues HEP at home but not sure it is helpful. She has trouble maintaining balance. She has significant bilateral knee pain. She is followed by ortho but not a candidate for surgery due to weight. She is trying to work on [employer]. She is not able to exercise. She stays in afib. Continues Xarelto . No dizziness but does have intermittent shob. MRI showed microvascular changes and atrophy in 2017. She has had some trouble with finding words at times  or remembering names. No trouble managing home or driving. Able to manage meds. Performs ADLs independently. She lives alone. She had two maternal uncles with AD. She continues close follow up with PCP, cardiology and ortho.   06/09/2022 ALL: Ashley Pacheco returns for follow up for seizures. She continues levetiracetam  500mg  BID. No seizures. She is tolerating med, well. She reports four falls over the past year. Most have been in settings of putting on clothing or when stepping out of shower. She has tripped over uneven pavement. She is followed by PCP for falls. She participated in PT twice weekly for about 8-10 weeks then stopped due to knee pain. She is followed by ortho. Needs replacement but unable to due to weight. She denies stoke symptoms. She is on Xarelto  for afib. Last MRI in 2019 showed generalized atrophy. Dexa in 2022 showed osteopenia. She is on Vitamin D  supplements but had to stop calcium due to kidney stones.   06/03/2021 ALL:  Ashley Pacheco returns for follow up for seizures. She continues levetiracetam  500mg  BID. She denies recent seizure activity. She continues to have trouble with feeling lightheaded when she sits up in bed. She has had a couple of falls but was not dizzy or feeling lightheaded. She feels falls are when she doesn't have her cane. She is trying to use it all the time.   06/02/2020 ALL:  Ashley Pacheco is a 78 y.o. female here today for follow up for seizures. She continues to do well  on levetiracetam  500mg  BID. Last seizure in 2017. She is tolerating medication well.   She reports concerns of being off balance. She feels that this has gradually worsened. She fell last August walking up steps. She reports that she has fallen 2-3 times since. She gets dizzy with position changes. She is not sure this has been a contributor to falls. She has a significant heart history. She is now using a single prong cane. No recent falls. She has mentioned this to her PCP. She has not considered PT.  She is using a balance oil that she feels may be helping.   HISTORY: (copied from my note on 05/30/2019)  Ashley Pacheco is a 78 y.o. female here today for follow up of seizure. She continues levetiracetam  500mg  twice daily. She denies seizure activity. Last seizure in 2017. She reports that headaches have resolved. She rarely has one. She is doing well and without complaints.    HISTORY: (copied from Dr Chancy note on 05/24/2018)   UPDATE (05/24/18, VRP): Since last visit, doing well. No seizures. However, now with new HA x 2-3 months, 2 per week, occipital. No other sxs. No similar HA in the past. Severity is mild. No alleviating or aggravating factors. Tolerating levetiracetam .     UPDATE 04/01/17: Since last visit, was in hosp in April 2018, dx'd with afib and RVR, now on xarelto  and diltiazem . Now doing well. No seizures. Tolerating LEV.    UPDATE 10/01/16: Since last visit, doing well. No sz. Tolerating LEV. No other abnl spells. She has some snoring, but no sig daytime fatigue. She is concerned about risk of stroke due to her family history.    PRIOR HPI (08/13/16): 78 year old right-handed female here for evaluation of possible seizure. Patient has history of hypertension, hyperglycemia depression and heart flutter. 08/10/16, patient was at home playing solitaire on her tablet. She was sitting in a lounge chair where she sometimes takes a nap in the afternoon. Around 2 PM patient noticed that this screen on the tablet was wavy. Patient looked away from the tablet and saw an afterimage of the tablet follow her visual tracking to the right side. Patient then tried to use the TV remote to turn off the television. She tried pressing a few buttons was unable to turn off the TV. The next thing she remembered she was waking up, noticed that the TV remote was on the floor and the television was still on. She then noticed that her tongue and throat were very sore. She got up and looked in the mirror  she found that she had severely bitten her tongue on both sides. No incontinence. No definite postictal confusion. She did not feel sore in her arms or legs. In retrospect patient had a similar but minor event in spring of 2017 where she was using her tablet and saw a wavy screen lasting for approximately 1 minute. She did not have tongue biting or any other symptoms with that episode. Patient has normal worth and development mental history. No history of seizure in the past. No head trauma, meningitis or encephalitis in the past. No family history of seizure. Patient lives alone, is fairly active, takes care of all ADLs. No recent change in medication, alcohol, sleep, stress or other events.   REVIEW OF SYSTEMS: Out of a complete 14 system review of symptoms, the patient complains only of the following symptoms, seizures, imbalance, falls and all other reviewed systems are negative.  ALLERGIES: Allergies  Allergen Reactions  Adhesive [Tape] Rash   Telmisartan      Other reaction(s): cough    HOME MEDICATIONS: Outpatient Medications Prior to Visit  Medication Sig Dispense Refill   Cholecalciferol (VITAMIN D ) 2000 UNITS tablet Take 2,000 Units by mouth daily.      Cod Liver Oil 1000 MG CAPS Take 1 capsule by mouth daily as needed.     digoxin  (LANOXIN ) 0.125 MG tablet TAKE 1 TABLET BY MOUTH EVERY DAY 90 tablet 3   diphenhydrAMINE (BENADRYL) 25 MG tablet Take 25 mg by mouth as needed (allergies).      Ferrous Gluconate (IRON 27 PO) Take by mouth as needed.     furosemide  (LASIX ) 40 MG tablet TAKE 1 TABLET BY MOUTH EVERY DAY 90 tablet 2   loperamide (IMODIUM) 2 MG capsule Take 2 mg by mouth as needed for diarrhea or loose stools.     lovastatin (MEVACOR) 40 MG tablet Take 40 mg by mouth every morning.      metoprolol  succinate (TOPROL -XL) 50 MG 24 hr tablet TAKE 1 TABLET BY MOUTH IN THE MORNING AND AT BEDTIME. PLEASE KEEP UPCOMING APPT IN DECEMBER (Patient taking differently: TAKE 1 TABLET BY  MOUTH IN THE MORNING AND AT BEDTIME.) 180 tablet 2   Multiple Vitamin (MULTI-VITAMIN DAILY PO) Take 1 tablet by mouth daily.      naproxen sodium (ALEVE) 220 MG tablet Take 220 mg by mouth daily as needed (rarely as needed for knee pain).     rivaroxaban  (XARELTO ) 20 MG TABS tablet Take 1 tablet (20 mg total) by mouth daily with supper. 90 tablet 1   sertraline  (ZOLOFT ) 100 MG tablet Take 100 mg by mouth daily.     traZODone  (DESYREL ) 150 MG tablet Take 150 mg by mouth at bedtime.      levETIRAcetam  (KEPPRA ) 500 MG tablet Take 1 tablet (500 mg total) by mouth 2 (two) times daily. 180 tablet 4   No facility-administered medications prior to visit.    PAST MEDICAL HISTORY: Past Medical History:  Diagnosis Date   Aortic stenosis 05/18/2017   Arthritis    in knee   Atrial fibrillation (HCC)    Atrial fibrillation with rapid ventricular response (HCC) 02/14/2017   Atrial fibrillation with RVR (HCC) 11/15/2017   Atypical chest pain    a. 2015 Neg MV.   Cardiomegaly 2005   Cardiomyopathy [facility])    a. 01/2017 Echo: EF 45%, diff HK, mild LVH, triv AI, mild MR, mod dil LA, mildly dil RA, PASP - in setting of Afib.   DVT (deep venous thrombosis) (HCC) 02/2004   Fibroid    Hypercholesteremia    Hypertension    Menometrorrhagia 3/92   menopausal   Microhematuria 1/96   Mitral regurgitation 05/18/2017   Nephrolithiasis 2015   PAF (paroxysmal atrial fibrillation) (HCC)    a. 01/2017 TEE/DCCV: EF 45-50%, no LA/LAA thrombus-->successful DCCV @ 150 J. CHA2DS2VASc = 3-->Xarelto .   Seizure disorder (HCC) 02/17/2017   Seizures (HCC)    followed by [facility]- Dr. Margaret   SUI (stress urinary incontinence, female) 9/91   Vulvar cyst 7/90    PAST SURGICAL HISTORY: Past Surgical History:  Procedure Laterality Date   APPENDECTOMY  1950   BUNIONECTOMY  7/11   second toe   CARDIOVERSION N/A 02/17/2017   Procedure: CARDIOVERSION;  Surgeon: Annabella Scarce, MD;  Location: [facility]  ENDOSCOPY;  Service: Cardiovascular;  Laterality: N/A;   cataracts  3/09 4/09   DILATION AND CURETTAGE OF UTERUS  hystreroscopy/imcomplete polypectomy   KNEE SURGERY  10/13 or 11/13   right knee arthroscopy   TEE WITHOUT CARDIOVERSION N/A 02/17/2017   Procedure: TRANSESOPHAGEAL ECHOCARDIOGRAM (TEE);  Surgeon: Annabella Scarce, MD;  Location: [facility] ENDOSCOPY;  Service: Cardiovascular;  Laterality: N/A;   TONSILLECTOMY AND ADENOIDECTOMY  1950   TUBAL LIGATION  1975   BTL    FAMILY HISTORY: Family History  Problem Relation Age of Onset   Cancer Father        stomach and prostate   Kidney Stones Father    Hypertension Mother    Asthma Mother    Kidney Stones Mother    Stroke Maternal Uncle    Heart attack Maternal Uncle    Hypertension Maternal Uncle    Cancer Paternal Uncle    Stroke Maternal Grandfather    Heart disease Sister        Rapid rhythm   Skin cancer Sister    Heart disease Brother        Rapid rhythm   Skin cancer Brother     SOCIAL HISTORY: Social History   Socioeconomic History   Marital status: Widowed    Spouse name: Not on file   Number of children: 2   Years of education: 44   Highest education level: Not on file  Occupational History    Comment: volunteer  Tobacco Use   Smoking status: Former    Types: Cigarettes   Smokeless tobacco: Never   Tobacco comments:    quit in 1969  Vaping Use   Vaping status: Never Used  Substance and Sexual Activity   Alcohol use: Yes    Alcohol/week: 2.0 standard drinks of alcohol    Types: 2 Glasses of wine per week   Drug use: No   Sexual activity: Not Currently    Partners: Male  Other Topics Concern   Not on file  Social History Narrative   Lives alone   Caffeine- coffee- 3 mugs daily   Social Drivers of Corporate investment banker Strain: Not on file  Food Insecurity: Not on file  Transportation Needs: Not on file  Physical Activity: Not on file  Stress: Not on file  Social Connections: Not on  file  Intimate Partner Violence: Not on file      PHYSICAL EXAM  Vitals:   06/06/24 1047  BP: 130/80  Pulse: 86  SpO2: 97%  Weight: 211 lb 8 oz (95.9 kg)  Height: 5' 1 (1.549 m)       Body mass index is 39.96 kg/m.  Generalized: Well developed, in no acute distress  Cardiology: irregularly irregular, no murmur noted Respiratory: clear to auscultation bilaterally  Neurological examination  Mentation: Alert oriented to time, place, history taking. Follows all commands speech and language fluent Cranial nerve II-XII: Pupils were equal round reactive to light. Extraocular movements were full, visual field were full on confrontational test. Facial sensation and strength were normal. Head turning and shoulder shrug  were normal and symmetric. Motor: The motor testing reveals 4+ over 5 strength of all 4 extremities. Good symmetric motor tone is noted throughout.  Sensory: Sensory testing is intact to soft touch on all 4 extremities. No evidence of extinction is noted.  Coordination: Cerebellar testing reveals good finger-nose-finger and heel-to-shin (reduced slightly due to body habitus) bilaterally.  Gait and station: Slowly pushes to standing position reporting stiffness and pain in both knees. Gait is slightly wide but stable with single prong cane. She does not pick up her feet.  Patient is wearing flip flops today. Tandem gait not attempted, today Reflexes: Deep tendon reflexes are symmetric and normal bilaterally.   DIAGNOSTIC DATA (LABS, IMAGING, TESTING) - I reviewed patient records, labs, notes, testing and imaging myself where available.      No data to display           Lab Results  Component Value Date   WBC 7.9 04/19/2018   HGB 13.4 04/19/2018   HCT 40.4 04/19/2018   MCV 82 04/19/2018   PLT 194 04/19/2018      Component Value Date/Time   NA 144 04/19/2018 1034   K 4.0 04/19/2018 1034   CL 104 04/19/2018 1034   CO2 27 04/19/2018 1034   GLUCOSE 108 (H)  04/19/2018 1034   GLUCOSE 106 (H) 11/15/2017 1725   BUN 10 04/19/2018 1034   CREATININE 0.88 04/19/2018 1034   CALCIUM 8.8 04/19/2018 1034   PROT 6.0 04/19/2018 1034   ALBUMIN 3.9 04/19/2018 1034   AST 14 04/19/2018 1034   ALT 13 04/19/2018 1034   ALKPHOS 83 04/19/2018 1034   BILITOT 0.4 04/19/2018 1034   GFRNONAA 66 04/19/2018 1034   GFRAA 76 04/19/2018 1034   Lab Results  Component Value Date   CHOL 166 04/19/2018   HDL 35 (L) 04/19/2018   LDLCALC 103 (H) 04/19/2018   TRIG 140 04/19/2018   CHOLHDL 4.7 (H) 04/19/2018   Lab Results  Component Value Date   HGBA1C 5.5 11/15/2017   No results found for: [id number] Lab Results  Component Value Date   TSH 1.940 04/19/2018       ASSESSMENT AND PLAN 78 y.o. year old female  has a past medical history of Aortic stenosis (05/18/2017), Arthritis, Atrial fibrillation (HCC), Atrial fibrillation with rapid ventricular response (HCC) (02/14/2017), Atrial fibrillation with RVR (HCC) (11/15/2017), Atypical chest pain, Cardiomegaly (2005), Cardiomyopathy (HCC), DVT (deep venous thrombosis) (HCC) (02/2004), Fibroid, Hypercholesteremia, Hypertension, Menometrorrhagia (3/92), Microhematuria (1/96), Mitral regurgitation (05/18/2017), Nephrolithiasis (2015), PAF (paroxysmal atrial fibrillation) (HCC), Seizure disorder (HCC) (02/17/2017), Seizures (HCC), SUI (stress urinary incontinence, female) (9/91), and Vulvar cyst (7/90). here with     ICD-10-CM   1. Seizure disorder (HCC)  G40.909     2. Cerebral atrophy (HCC)  G31.9     3. Fall, subsequent encounter  W19.XXXD       Breta is doing well from a seizure standpoint. We will continue levetiracetam  500mg  twice daily. We have discussed her concerns of falls. Could consider repeat MRI and or referral to PT. She declines at this time. Supportive shoes recommended. She was encouraged to continue using her cane and fall precautions advised. Consider using walker when needed. Healthy lifestyle habits  encouraged. Continue working on [employer]. Continue follow up with care team as directed. She will follow up in 1 year, sooner if needed. She verbalizes understanding and agreement with this plan.    No orders of the defined types were placed in this encounter.     Meds ordered this encounter  Medications   levETIRAcetam  (KEPPRA ) 500 MG tablet    Sig: Take 1 tablet (500 mg total) by mouth 2 (two) times daily.    Dispense:  180 tablet    Refill:  4    Supervising Provider:   AHERN, ANTONIA B [id number]     I personally spent a total of 30 minutes in the care of the patient today including preparing to see the patient, getting/reviewing separately obtained history, performing a medically appropriate exam/evaluation, counseling and educating, placing  orders, and documenting clinical information in the EHR.   Greig Forbes, FNP-C 06/06/2024, 11:24 AM [facility] [address], [address] [place], [place] [address] [phone]

## 2024-06-06 NOTE — Patient Instructions (Signed)
 Below is our plan:  We will continue levetiracetam  500mg  twice daily. Continue fall precautions as discussed. Consider PT if needed.   Please make sure you are consistent with timing of seizure medication. I recommend annual visit with primary care provider (PCP) for complete physical and routine blood work. I recommend daily intake of vitamin D  (400-800iu) and calcium (800-1000mg ) for bone health. Discuss Dexa screening with PCP.   According to [place] law, you can not drive unless you are seizure / syncope free for at least 6 months and under physician's care.  Please maintain precautions. Do not participate in activities where a loss of awareness could harm you or someone else. No swimming alone, no tub bathing, no hot tubs, no driving, no operating motorized vehicles (cars, ATVs, motocycles, etc), lawnmowers, power tools or firearms. No standing at heights, such as rooftops, ladders or stairs. Avoid hot objects such as stoves, heaters, open fires. Wear a helmet when riding a bicycle, scooter, skateboard, etc. and avoid areas of traffic. Set your water heater to 120 degrees or less.  SUDEP is the sudden, unexpected death of someone with epilepsy, who was otherwise healthy. In SUDEP cases, no other cause of death is found when an autopsy is done. Each year, more than 1 in 1,000 people with epilepsy die from SUDEP. This is the leading cause of death in people with uncontrolled seizures. Until further answers are available, the best way to prevent SUDEP is to lower your risk by controlling seizures. Research has found that people with all types of epilepsy that experience convulsive seizures can be at risk.  Please make sure you are staying well hydrated. I recommend 50-60 ounces daily. Well balanced diet and regular exercise encouraged. Consistent sleep schedule with 6-8 hours recommended.   Please continue follow up with care team as directed.   Follow up with me in 1 year   You may receive a survey  regarding today's visit. I encourage you to leave honest feed back as I do use this information to improve patient care. Thank you for seeing me today!

## 2024-08-10 DIAGNOSIS — Z23 Encounter for immunization: Secondary | ICD-10-CM | POA: Diagnosis not present

## 2024-08-30 DIAGNOSIS — I5020 Unspecified systolic (congestive) heart failure: Secondary | ICD-10-CM | POA: Diagnosis not present

## 2024-08-30 DIAGNOSIS — F3341 Major depressive disorder, recurrent, in partial remission: Secondary | ICD-10-CM | POA: Diagnosis not present

## 2024-08-30 DIAGNOSIS — E559 Vitamin D deficiency, unspecified: Secondary | ICD-10-CM | POA: Diagnosis not present

## 2024-08-30 DIAGNOSIS — I10 Essential (primary) hypertension: Secondary | ICD-10-CM | POA: Diagnosis not present

## 2024-08-30 DIAGNOSIS — E7800 Pure hypercholesterolemia, unspecified: Secondary | ICD-10-CM | POA: Diagnosis not present

## 2024-08-30 DIAGNOSIS — R0789 Other chest pain: Secondary | ICD-10-CM | POA: Diagnosis not present

## 2024-08-30 DIAGNOSIS — R7303 Prediabetes: Secondary | ICD-10-CM | POA: Diagnosis not present

## 2024-08-30 DIAGNOSIS — Z6841 Body Mass Index (BMI) 40.0 and over, adult: Secondary | ICD-10-CM | POA: Diagnosis not present

## 2024-08-30 DIAGNOSIS — Z7901 Long term (current) use of anticoagulants: Secondary | ICD-10-CM | POA: Diagnosis not present

## 2024-08-30 DIAGNOSIS — I4821 Permanent atrial fibrillation: Secondary | ICD-10-CM | POA: Diagnosis not present

## 2024-08-30 DIAGNOSIS — Z0000 Encounter for general adult medical examination without abnormal findings: Secondary | ICD-10-CM | POA: Diagnosis not present

## 2024-08-30 DIAGNOSIS — G40909 Epilepsy, unspecified, not intractable, without status epilepticus: Secondary | ICD-10-CM | POA: Diagnosis not present

## 2024-08-30 DIAGNOSIS — R296 Repeated falls: Secondary | ICD-10-CM | POA: Diagnosis not present

## 2024-09-09 ENCOUNTER — Other Ambulatory Visit: Payer: Self-pay | Admitting: Cardiology

## 2024-09-09 DIAGNOSIS — I4891 Unspecified atrial fibrillation: Secondary | ICD-10-CM

## 2024-09-10 MED ORDER — RIVAROXABAN 20 MG PO TABS: 20.0000 mg | ORAL_TABLET | Freq: Every day | ORAL | 1 refills | Status: AC

## 2024-09-10 NOTE — Telephone Encounter (Signed)
 Prescription refill request for Xarelto  received.  Indication: a-fib Last office visit: 03/27/2024 Weight: 95.9kg Age: 78 Scr: 0.79 (Media AMB Corr 01/2028) CrCl: 103  Refill sent

## 2025-06-03 ENCOUNTER — Ambulatory Visit: Admitting: Family Medicine

## 2025-06-05 ENCOUNTER — Ambulatory Visit: Admitting: Family Medicine
